# Patient Record
Sex: Female | Born: 1960 | Race: Black or African American | Hispanic: No | Marital: Married | Smoking: Current every day smoker
Health system: Southern US, Community
[De-identification: ages and names within clinical notes are randomized; demographics above are authoritative.]

## PROBLEM LIST (undated history)

## (undated) DIAGNOSIS — K219 Gastro-esophageal reflux disease without esophagitis: Secondary | ICD-10-CM

## (undated) DIAGNOSIS — I10 Essential (primary) hypertension: Secondary | ICD-10-CM

## (undated) DIAGNOSIS — J42 Unspecified chronic bronchitis: Secondary | ICD-10-CM

## (undated) DIAGNOSIS — J189 Pneumonia, unspecified organism: Secondary | ICD-10-CM

## (undated) DIAGNOSIS — R7303 Prediabetes: Secondary | ICD-10-CM

## (undated) DIAGNOSIS — J3801 Paralysis of vocal cords and larynx, unilateral: Secondary | ICD-10-CM

## (undated) DIAGNOSIS — D649 Anemia, unspecified: Secondary | ICD-10-CM

## (undated) DIAGNOSIS — M47812 Spondylosis without myelopathy or radiculopathy, cervical region: Secondary | ICD-10-CM

## (undated) DIAGNOSIS — E041 Nontoxic single thyroid nodule: Secondary | ICD-10-CM

## (undated) DIAGNOSIS — J302 Other seasonal allergic rhinitis: Secondary | ICD-10-CM

## (undated) DIAGNOSIS — R010 Benign and innocent cardiac murmurs: Secondary | ICD-10-CM

## (undated) DIAGNOSIS — M1990 Unspecified osteoarthritis, unspecified site: Secondary | ICD-10-CM

## (undated) HISTORY — PX: TUBAL LIGATION: SHX77

## (undated) HISTORY — DX: Essential (primary) hypertension: I10

## (undated) HISTORY — DX: Unspecified chronic bronchitis: J42

## (undated) HISTORY — DX: Paralysis of vocal cords and larynx, unilateral: J38.01

## (undated) HISTORY — DX: Prediabetes: R73.03

## (undated) HISTORY — PX: BIOPSY THYROID: PRO38

## (undated) HISTORY — DX: Nontoxic single thyroid nodule: E04.1

## (undated) HISTORY — DX: Benign and innocent cardiac murmurs: R01.0

## (undated) HISTORY — DX: Spondylosis without myelopathy or radiculopathy, cervical region: M47.812

---

## 1983-11-24 HISTORY — PX: LACERATION REPAIR: SHX5168

## 2016-06-17 DIAGNOSIS — F5101 Primary insomnia: Secondary | ICD-10-CM | POA: Diagnosis not present

## 2016-06-17 DIAGNOSIS — Z9109 Other allergy status, other than to drugs and biological substances: Secondary | ICD-10-CM | POA: Diagnosis not present

## 2016-06-17 DIAGNOSIS — J0100 Acute maxillary sinusitis, unspecified: Secondary | ICD-10-CM | POA: Diagnosis not present

## 2016-06-17 DIAGNOSIS — I10 Essential (primary) hypertension: Secondary | ICD-10-CM | POA: Diagnosis not present

## 2016-08-04 DIAGNOSIS — I10 Essential (primary) hypertension: Secondary | ICD-10-CM | POA: Diagnosis not present

## 2016-08-04 DIAGNOSIS — R011 Cardiac murmur, unspecified: Secondary | ICD-10-CM | POA: Diagnosis not present

## 2016-08-17 DIAGNOSIS — I371 Nonrheumatic pulmonary valve insufficiency: Secondary | ICD-10-CM | POA: Diagnosis not present

## 2016-08-17 DIAGNOSIS — R011 Cardiac murmur, unspecified: Secondary | ICD-10-CM | POA: Diagnosis not present

## 2016-10-23 DIAGNOSIS — J189 Pneumonia, unspecified organism: Secondary | ICD-10-CM

## 2016-10-23 HISTORY — DX: Pneumonia, unspecified organism: J18.9

## 2016-11-02 DIAGNOSIS — E049 Nontoxic goiter, unspecified: Secondary | ICD-10-CM | POA: Diagnosis not present

## 2016-11-02 DIAGNOSIS — R05 Cough: Secondary | ICD-10-CM | POA: Diagnosis not present

## 2016-11-02 DIAGNOSIS — J181 Lobar pneumonia, unspecified organism: Secondary | ICD-10-CM | POA: Diagnosis not present

## 2016-11-02 DIAGNOSIS — R918 Other nonspecific abnormal finding of lung field: Secondary | ICD-10-CM | POA: Diagnosis not present

## 2016-11-04 ENCOUNTER — Encounter: Payer: Self-pay | Admitting: Physician Assistant

## 2016-11-04 ENCOUNTER — Ambulatory Visit (INDEPENDENT_AMBULATORY_CARE_PROVIDER_SITE_OTHER): Payer: Federal, State, Local not specified - PPO | Admitting: Physician Assistant

## 2016-11-04 VITALS — BP 157/85 | HR 93 | Wt 187.0 lb

## 2016-11-04 DIAGNOSIS — L0592 Pilonidal sinus without abscess: Secondary | ICD-10-CM | POA: Insufficient documentation

## 2016-11-04 DIAGNOSIS — E049 Nontoxic goiter, unspecified: Secondary | ICD-10-CM

## 2016-11-04 DIAGNOSIS — N951 Menopausal and female climacteric states: Secondary | ICD-10-CM

## 2016-11-04 DIAGNOSIS — E041 Nontoxic single thyroid nodule: Secondary | ICD-10-CM

## 2016-11-04 DIAGNOSIS — I10 Essential (primary) hypertension: Secondary | ICD-10-CM

## 2016-11-04 DIAGNOSIS — Z79899 Other long term (current) drug therapy: Secondary | ICD-10-CM | POA: Insufficient documentation

## 2016-11-04 DIAGNOSIS — L0591 Pilonidal cyst without abscess: Secondary | ICD-10-CM

## 2016-11-04 DIAGNOSIS — Z299 Encounter for prophylactic measures, unspecified: Secondary | ICD-10-CM

## 2016-11-04 DIAGNOSIS — E876 Hypokalemia: Secondary | ICD-10-CM

## 2016-11-04 DIAGNOSIS — T502X5A Adverse effect of carbonic-anhydrase inhibitors, benzothiadiazides and other diuretics, initial encounter: Secondary | ICD-10-CM

## 2016-11-04 DIAGNOSIS — E559 Vitamin D deficiency, unspecified: Secondary | ICD-10-CM

## 2016-11-04 HISTORY — DX: Menopausal and female climacteric states: N95.1

## 2016-11-04 HISTORY — DX: Pilonidal sinus without abscess: L05.92

## 2016-11-04 HISTORY — DX: Nontoxic single thyroid nodule: E04.1

## 2016-11-04 MED ORDER — HYDROCHLOROTHIAZIDE 12.5 MG PO TABS: 25.0000 mg | ORAL_TABLET | Freq: Every day | ORAL | 3 refills | Status: DC

## 2016-11-04 NOTE — Patient Instructions (Signed)
Pilonidal Cyst Introduction A pilonidal cyst is a fluid-filled sac. It forms beneath the skin near your tailbone, at the top of the crease of your buttocks. A pilonidal cyst that is not large or infected may not cause symptoms or problems. If the cyst becomes irritated or infected, it may fill with pus. This causes pain and swelling (pilonidal abscess). An infected cyst may need to be treated with medicine, drained, or removed. What are the causes? The cause of a pilonidal cyst is not known. One cause may be a hair that grows into your skin (ingrown hair). What increases the risk? Pilonidal cysts are more common in boys and men. Risk factors include:  Having lots of hair near the crease of the buttocks.  Being overweight.  Having a pilonidal dimple.  Wearing tight clothing.  Not bathing or showering frequently.  Sitting for long periods of time. What are the signs or symptoms? Signs and symptoms of a pilonidal cyst may include:  Redness.  Pain and tenderness.  Warmth.  Swelling.  Pus.  Fever. How is this diagnosed? Your health care provider may diagnose a pilonidal cyst based on your symptoms and a physical exam. The health care provider may do a blood test to check for infection. If your cyst is draining pus, your health care provider may take a sample of the drainage to be tested at a laboratory. How is this treated? Surgery is the usual treatment for an infected pilonidal cyst. You may also have to take medicines before surgery. The type of surgery you have depends on the size and severity of the infected cyst. The different kinds of surgery include:  Incision and drainage. This is a procedure to open and drain the cyst.  Marsupialization. In this procedure, a large cyst or abscess may be opened and kept open by stitching the edges of the skin to the cyst walls.  Cyst removal. This procedure involves opening the skin and removing all or part of the cyst. Follow these  instructions at home:  Follow all of your surgeon's instructions carefully if you had surgery.  Take medicines only as directed by your health care provider.  If you were prescribed an antibiotic medicine, finish it all even if you start to feel better.  Keep the area around your pilonidal cyst clean and dry.  Clean the area as directed by your health care provider. Pat the area dry with a clean towel. Do not rub it as this may cause bleeding.  Remove hair from the area around the cyst as directed by your health care provider.  Do not wear tight clothing or sit in one place for long periods of time.  There are many different ways to close and cover an incision, including stitches, skin glue, and adhesive strips. Follow your health care provider's instructions on:  Incision care.  Bandage (dressing) changes and removal.  Incision closure removal. Contact a health care provider if:  You have drainage, redness, swelling, or pain at the site of the cyst.  You have a fever. This information is not intended to replace advice given to you by your health care provider. Make sure you discuss any questions you have with your health care provider. Document Released: 11/06/2000 Document Revised: 04/16/2016 Document Reviewed: 03/29/2014  2017 Elsevier

## 2016-11-04 NOTE — Progress Notes (Signed)
Hailey Robinson is a 55 y.o. female who presents to [facility] today to establish care.  Enlarged Thyroid: Patient was seen at [facility] urgent care on 12/11 and diagnosed with RLL CAP. Currently on Azithromycin and improving. It was noted on her CXR that she has an enlarged left thyroid lobe that is partially displacing her trachea and followup US was recommended. She has not noticed her thyroid changing in size and it is not causing her any discomfort. States that her sister had to have her thyroid removed.   HTN: she has a hx of HTN and was on Lisinopril 4-5 years ago. States that she lost weight and was able to discontinue medication per her PCP. In reviewing her outside records from [facility], she had an elevated BP of 146/70. Checks BP's at work and they have been [id number] systolic. She reports occasional headaches. Denies chest pain, dyspnea, lightheadedness, and edema. Of note, she had an ECHO on 08/17/16 that showed mild LV hypertrophy. LV EF 60-65%.  Perimenopause: patient states that periods have become irregular and shorter. LMP was 3 months ago and lasted 2 days. Endorses vasomotor symptoms, mood swings, and decreased libido. Denies dyspareunia or abnormal vaginal discharge.  Additionally, she complained of an area on her buttocks where she says the skin is "thick." She states she has a history of "boils."  Past Medical History:  Diagnosis Date  . Allergy   . Functional systolic murmur    normal ECHO 07/2016  . Hypertension    Past Surgical History:  Procedure Laterality Date  . FINGER SURGERY    . TUBAL LIGATION     Social History  Substance Use Topics  . Smoking status: Current Every Day Smoker    Packs/day: 0.50    Years: 10.00  . Smokeless tobacco: Never Used  . Alcohol use 1.2 oz/week    2 Cans of beer per week   family history includes Hyperlipidemia  in her father and mother; Hypertension in her father and mother; Thyroid disease in her sister.  ROS: negative except as noted in the HPI  Medications: Current Outpatient Prescriptions  Medication Sig Dispense Refill  . azithromycin (ZITHROMAX) 250 MG tablet Take 2 tablets (500 mg) on  Day 1,  followed by 1 tablet (250 mg) once daily on Days 2 through 5.    . benzonatate (TESSALON) 200 MG capsule Take by mouth.    . promethazine-dextromethorphan (PROMETHAZINE-DM) 6.25-15 MG/5ML syrup Take by mouth.    . hydrochlorothiazide (HYDRODIURIL) 12.5 MG tablet Take 2 tablets (25 mg total) by mouth daily. 30 tablet 3   No current facility-administered medications for this visit.    No Known Allergies  Health Maintenance Health Maintenance  Topic Date Due  . Hepatitis C Screening  11-03-1961  . HIV Screening  02/28/1977  . TETANUS/TDAP  02/28/1981  . PAP SMEAR  03/01/1983  . MAMMOGRAM  02/29/2012  . COLONOSCOPY  02/29/2012  . INFLUENZA VACCINE  Completed     Objective:  BP (!) 157/85   Pulse 93   Wt 187 lb (84.8 kg)  Physical Exam  HENT:  Right Ear: Tympanic membrane normal.  Left Ear: Tympanic membrane normal.  Nose: Nose normal.  Mouth/Throat: Oropharynx is clear and moist and mucous membranes are normal. No oral lesions. Abnormal dentition (missing multiple molars). Dental caries (numerous) present.  Eyes: Conjunctivae, EOM and lids are normal. Pupils are equal, round, and reactive to light.  Neck: Phonation normal. No tracheal tenderness  present. Carotid bruit is not present. Thyromegaly (left greater than right, nontender, no palpable nodule) present.  Cardiovascular: Normal rate, regular rhythm, S1 normal, S2 normal and intact distal pulses.   Murmur (2/6 systolic at LUSB) heard. Pulmonary/Chest: Effort normal. No stridor. She has decreased breath sounds in the right middle field and the right lower field. She has no wheezes. She has no rhonchi. She has no rales.    Lymphadenopathy:    She has no cervical adenopathy.       Right: No supraclavicular adenopathy present.       Left: No supraclavicular adenopathy present.  Neurological: She is alert. No cranial nerve deficit.  Skin: Skin is warm and dry. No bruising and no rash noted.     Psychiatric: She has a normal mood and affect. Her speech is normal and behavior is normal. Thought content normal.   I personally reviewed outside records, including most recent echocardiogram  Assessment and Plan: 55 y.o. female with   1. Thyroid enlargement - TSH - US THYROID - T3, free - T4, free - Thyroglobulin Antibody Panel - Thyroid stimulating immunoglobulin  2. Preventive measure - Comprehensive metabolic panel - Hemoglobin A1c - Lipid panel - VITAMIN D 25 Hydroxy (Vit-D Deficiency, Fractures) - CBC - Hepatitis C antibody - HIV antibody  3. Essential hypertension -Given her multiple risk factors (african american, obesity, family hx) and hx of HTN requiring Lisinopril, multiple elevated home readings, and signs of early LV hypertrophy I think it is reasonable to start medication today -Patient instructed to continue to self-monitor and record readings - Lipid panel - hydrochlorothiazide (HYDRODIURIL) 12.5 MG tablet; Take 2 tablets (25 mg total) by mouth daily.  Dispense: 30 tablet; Refill: 3  4. Perimenopause   5. Pilonidal sinus - Ambulatory referral to General Surgery  Patient education and anticipatory guidance given Patient agrees with treatment plan Follow-up in 1 month or sooner as needed.  Darlyne Russian PA-C

## 2016-11-07 LAB — LIPID PANEL
CHOLESTEROL: 150 mg/dL (ref <200)
HDL: 34 mg/dL — AB (ref >50)
LDL CALC: 97 mg/dL (ref <100)
TRIGLYCERIDES: 94 mg/dL (ref <150)
Total CHOL/HDL Ratio: 4.4 Ratio (ref <5.0)
VLDL: 19 mg/dL (ref <30)

## 2016-11-07 LAB — CBC
HEMATOCRIT: 33.5 % — AB (ref 35.0–45.0)
HEMOGLOBIN: 9.4 g/dL — AB (ref 11.7–15.5)
MCH: 18.5 pg — ABNORMAL LOW (ref 27.0–33.0)
MCHC: 28.1 g/dL — ABNORMAL LOW (ref 32.0–36.0)
MCV: 66.1 fL — AB (ref 80.0–100.0)
MPV: 9.2 fL (ref 7.5–12.5)
Platelets: 383 10*3/uL (ref 140–400)
RBC: 5.07 MIL/uL (ref 3.80–5.10)
RDW: 17.6 % — ABNORMAL HIGH (ref 11.0–15.0)
WBC: 5.3 10*3/uL (ref 3.8–10.8)

## 2016-11-07 LAB — COMPREHENSIVE METABOLIC PANEL
ALT: 8 U/L (ref 6–29)
AST: 16 U/L (ref 10–35)
Albumin: 4.1 g/dL (ref 3.6–5.1)
Alkaline Phosphatase: 77 U/L (ref 33–130)
BUN: 6 mg/dL — AB (ref 7–25)
CALCIUM: 9.7 mg/dL (ref 8.6–10.4)
CHLORIDE: 97 mmol/L — AB (ref 98–110)
CO2: 28 mmol/L (ref 20–31)
Creat: 0.66 mg/dL (ref 0.50–1.05)
GLUCOSE: 89 mg/dL (ref 65–99)
POTASSIUM: 3.3 mmol/L — AB (ref 3.5–5.3)
Sodium: 139 mmol/L (ref 135–146)
Total Bilirubin: 0.4 mg/dL (ref 0.2–1.2)
Total Protein: 8.2 g/dL — ABNORMAL HIGH (ref 6.1–8.1)

## 2016-11-07 LAB — HIV ANTIBODY (ROUTINE TESTING W REFLEX): HIV 1&2 Ab, 4th Generation: NONREACTIVE

## 2016-11-07 LAB — HEMOGLOBIN A1C
HEMOGLOBIN A1C: 5.4 % (ref <5.7)
Mean Plasma Glucose: 108 mg/dL

## 2016-11-07 LAB — T4, FREE: FREE T4: 1.2 ng/dL (ref 0.8–1.8)

## 2016-11-07 LAB — VITAMIN D 25 HYDROXY (VIT D DEFICIENCY, FRACTURES): Vit D, 25-Hydroxy: 20 ng/mL — ABNORMAL LOW (ref 30–100)

## 2016-11-07 LAB — THYROGLOBULIN ANTIBODY PANEL: THYROID PEROXIDASE ANTIBODY: 1 [IU]/mL (ref <9)

## 2016-11-07 LAB — TSH: TSH: 0.22 mIU/L — ABNORMAL LOW

## 2016-11-07 LAB — T3, FREE: T3 FREE: 3.9 pg/mL (ref 2.3–4.2)

## 2016-11-07 LAB — HEPATITIS C ANTIBODY: HCV Ab: NEGATIVE

## 2016-11-09 LAB — THYROID STIMULATING IMMUNOGLOBULIN: TSI: 338 %{baseline} — AB (ref <140)

## 2016-11-09 NOTE — Progress Notes (Signed)
Patient's labs show suppressed TSH (0.22) and extremely elevated Thyroglobulin (>450,000). Ordered Nuc Med Thyroid Uptake imaging and placed referral to Endocrinology.   Labs also showed a microcytic anemia (hgb 9.4, MCV 66.1) with elliptocytes. Will order anemia panel to determine if this is IDA or a thalassemia.   She was mildly hypokalemic (3.2) and is currently on Hydrodiuril 12.5mg  daily. Sent a prescription for potassium chloride tabs.   Additionally, sent prescription for Vit D 50,000U for vitamin D insufficiency (20).  I called the patient at 8:20am and left a voicemail instructing her to call back and schedule a follow-up appointment for this week.

## 2016-11-09 NOTE — Addendum Note (Signed)
Addended by: Nelson Chimes E on: 11/09/2016 10:47 AM   Modules accepted: Orders

## 2016-11-10 MED ORDER — POTASSIUM CHLORIDE CRYS ER 20 MEQ PO TBCR: 20.0000 meq | EXTENDED_RELEASE_TABLET | Freq: Every day | ORAL | 3 refills | Status: DC

## 2016-11-10 MED ORDER — VITAMIN D (ERGOCALCIFEROL) 1.25 MG (50000 UNIT) PO CAPS: 50000.0000 [IU] | ORAL_CAPSULE | ORAL | 0 refills | Status: DC

## 2016-11-10 NOTE — Addendum Note (Signed)
Addended by: Nelson Chimes E on: 11/10/2016 05:34 PM   Modules accepted: Orders

## 2016-11-13 ENCOUNTER — Ambulatory Visit (INDEPENDENT_AMBULATORY_CARE_PROVIDER_SITE_OTHER): Payer: Federal, State, Local not specified - PPO | Admitting: Physician Assistant

## 2016-11-13 VITALS — BP 145/81 | HR 89 | Wt 186.0 lb

## 2016-11-13 DIAGNOSIS — E0590 Thyrotoxicosis, unspecified without thyrotoxic crisis or storm: Secondary | ICD-10-CM | POA: Diagnosis not present

## 2016-11-13 NOTE — Progress Notes (Signed)
HPI:                                                                Hailey Robinson is a 55 y.o. female who presents to [facility] [place] today to review lab results  Patient had an incidental finding on CXR of an enlarged left thyroid lobe on 12/11 that was partially displacing her trachea. Patient is asymptomatic.   Patient's labs show suppressed TSH (0.22) and extremely elevated Thyroglobulin (>450,000) consistent with early Grave's disease.  Lab Results  Component Value Date   TSH 0.22 (L) 11/04/2016    She has an [place] scheduled for 11/19/16 and will be seeing Endocrinology on 12/18/16.   Past Medical History:  Diagnosis Date  . Allergy   . Functional systolic murmur    normal ECHO 07/2016  . Hypertension    Past Surgical History:  Procedure Laterality Date  . FINGER SURGERY    . TUBAL LIGATION     Social History  Substance Use Topics  . Smoking status: Current Every Day Smoker    Packs/day: 0.50    Years: 10.00  . Smokeless tobacco: Never Used  . Alcohol use 1.2 oz/week    2 Cans of beer per week   family history includes Hyperlipidemia in her father and mother; Hypertension in her father and mother; Thyroid disease in her sister.  ROS: negative except as noted in the HPI  Medications: Current Outpatient Prescriptions  Medication Sig Dispense Refill  . hydrochlorothiazide (HYDRODIURIL) 12.5 MG tablet Take 2 tablets (25 mg total) by mouth daily. 30 tablet 3  . potassium chloride SA (K-DUR,KLOR-CON) 20 MEQ tablet Take 1 tablet (20 mEq total) by mouth daily. 30 tablet 3  . Vitamin D, Ergocalciferol, (DRISDOL) 50000 units CAPS capsule Take 1 capsule (50,000 Units total) by mouth every 7 (seven) days. Take for 8 total doses(weeks) 8 capsule 0   No current facility-administered medications for this visit.    No Known Allergies   Objective:  BP (!) 145/81   Pulse 89   Wt 186 lb (84.4 kg)  Gen: Alert, not  ill-appearing, no distress HEENT: Extraocular movements intact, normal eyelids and sclera, no goiter Lungs: Normal work of breathing, speaking in full sentences. Extremities: no peripheral edema Skin: no rashes or lesions on exposed skin  Assessment and Plan: 55 y.o. female with new diagnosis of hyperthyroidism consistent with Grave's disease.  She is scheduled to be seen by Dr. Renne Crigler in Endocrinology next month.  Additionally, she has a Vitamin D deficiency and will start 50,000 supplementation weekly    Patient education and anticipatory guidance given Patient agrees with treatment plan Follow-up in 3 months or sooner as needed.  Darlyne Russian PA-C

## 2016-11-19 ENCOUNTER — Other Ambulatory Visit: Payer: Self-pay | Admitting: Sports Medicine

## 2016-11-19 ENCOUNTER — Ambulatory Visit (INDEPENDENT_AMBULATORY_CARE_PROVIDER_SITE_OTHER): Payer: Federal, State, Local not specified - PPO

## 2016-11-19 DIAGNOSIS — E049 Nontoxic goiter, unspecified: Secondary | ICD-10-CM

## 2016-11-19 DIAGNOSIS — I10 Essential (primary) hypertension: Secondary | ICD-10-CM

## 2016-11-19 DIAGNOSIS — E079 Disorder of thyroid, unspecified: Secondary | ICD-10-CM | POA: Diagnosis not present

## 2016-11-19 NOTE — Telephone Encounter (Signed)
To PCP

## 2016-11-19 NOTE — Addendum Note (Signed)
Addended by: Nelson Chimes E on: 11/19/2016 02:38 PM   Modules accepted: Orders

## 2016-11-19 NOTE — Progress Notes (Signed)
Ordered thyroid biopsy as recommended by the ultrasound report below. Informed patient today.  Thyroid U/S 11/19/16 IMPRESSION: Asymmetric enlargement of the left lobe of the thyroid secondary to an apparent 6 cm nodule/pseudo nodule within the posterior inferior aspect of the left lobe of the thyroid. This nodule/pseudo nodule meets imaging criteria to recommend percutaneous sampling as clinically indicated.  Darlyne Russian PA-C

## 2016-12-01 ENCOUNTER — Telehealth: Payer: Self-pay | Admitting: Physician Assistant

## 2016-12-01 NOTE — Telephone Encounter (Signed)
Pt notified that form is ready for pick up

## 2016-12-01 NOTE — Telephone Encounter (Signed)
Patient called checking on her disability or fmla paperwork asking if it was completed and ready for pick up and I adv would send a phone message. Pt req a nurse to call her regarding info. Thanks

## 2016-12-02 ENCOUNTER — Ambulatory Visit: Payer: Federal, State, Local not specified - PPO | Admitting: Physician Assistant

## 2016-12-08 ENCOUNTER — Other Ambulatory Visit (HOSPITAL_COMMUNITY)
Admission: RE | Admit: 2016-12-08 | Discharge: 2016-12-08 | Disposition: A | Discharge status: Home / Self Care | Payer: Federal, State, Local not specified - PPO | Source: Ambulatory Visit | Attending: Radiology | Admitting: Radiology

## 2016-12-08 ENCOUNTER — Ambulatory Visit
Admission: RE | Admit: 2016-12-08 | Discharge: 2016-12-08 | Disposition: A | Discharge status: Home / Self Care | Payer: Federal, State, Local not specified - PPO | Source: Ambulatory Visit | Attending: Physician Assistant | Admitting: Physician Assistant

## 2016-12-08 DIAGNOSIS — E041 Nontoxic single thyroid nodule: Secondary | ICD-10-CM | POA: Insufficient documentation

## 2016-12-11 ENCOUNTER — Other Ambulatory Visit: Payer: Self-pay | Admitting: Physician Assistant

## 2016-12-11 DIAGNOSIS — E049 Nontoxic goiter, unspecified: Secondary | ICD-10-CM

## 2016-12-16 ENCOUNTER — Telehealth: Payer: Self-pay | Admitting: Internal Medicine

## 2016-12-16 NOTE — Telephone Encounter (Signed)
New patient stated that she had a biospy last tue do she still need t come see you.

## 2016-12-16 NOTE — Telephone Encounter (Signed)
Called patient and she states that she did have the biopsy completed, but patient says that they had to redo the biopsy for tomorrow because they did not get adequate results for the first one. Patient is questioning if she needs to still come on Friday or if she needs to move the new appointment when we will have results. Please advise. Thank you!

## 2016-12-16 NOTE — Telephone Encounter (Signed)
She needs to come to the appointment. I can get the results afterwards.

## 2016-12-16 NOTE — Telephone Encounter (Signed)
Called and notified patient to keep appointment for Friday.

## 2016-12-17 ENCOUNTER — Other Ambulatory Visit (HOSPITAL_COMMUNITY)
Admission: RE | Admit: 2016-12-17 | Discharge: 2016-12-17 | Disposition: A | Discharge status: Home / Self Care | Payer: Federal, State, Local not specified - PPO | Source: Ambulatory Visit | Attending: Radiology | Admitting: Radiology

## 2016-12-17 ENCOUNTER — Ambulatory Visit
Admission: RE | Admit: 2016-12-17 | Discharge: 2016-12-17 | Disposition: A | Discharge status: Home / Self Care | Payer: Federal, State, Local not specified - PPO | Source: Ambulatory Visit | Attending: Physician Assistant | Admitting: Physician Assistant

## 2016-12-17 DIAGNOSIS — E041 Nontoxic single thyroid nodule: Secondary | ICD-10-CM | POA: Diagnosis not present

## 2016-12-17 DIAGNOSIS — E049 Nontoxic goiter, unspecified: Secondary | ICD-10-CM

## 2016-12-18 ENCOUNTER — Other Ambulatory Visit: Payer: Self-pay | Admitting: Radiology

## 2016-12-18 ENCOUNTER — Encounter: Payer: Self-pay | Admitting: Internal Medicine

## 2016-12-18 ENCOUNTER — Ambulatory Visit (INDEPENDENT_AMBULATORY_CARE_PROVIDER_SITE_OTHER): Payer: Federal, State, Local not specified - PPO | Admitting: Internal Medicine

## 2016-12-18 VITALS — BP 126/84 | HR 105 | Ht 65.5 in | Wt 191.0 lb

## 2016-12-18 DIAGNOSIS — R7989 Other specified abnormal findings of blood chemistry: Secondary | ICD-10-CM

## 2016-12-18 DIAGNOSIS — E041 Nontoxic single thyroid nodule: Secondary | ICD-10-CM | POA: Diagnosis not present

## 2016-12-18 DIAGNOSIS — E0590 Thyrotoxicosis, unspecified without thyrotoxic crisis or storm: Secondary | ICD-10-CM | POA: Diagnosis not present

## 2016-12-18 DIAGNOSIS — R946 Abnormal results of thyroid function studies: Secondary | ICD-10-CM | POA: Diagnosis not present

## 2016-12-18 LAB — TSH: TSH: 0.82 u[IU]/mL (ref 0.35–4.50)

## 2016-12-18 LAB — T4, FREE: Free T4: 0.79 ng/dL (ref 0.60–1.60)

## 2016-12-18 LAB — T3, FREE: T3 FREE: 3.2 pg/mL (ref 2.3–4.2)

## 2016-12-18 NOTE — Progress Notes (Signed)
Patient ID: Hailey Robinson, female   DOB: [birth year], 56 y.o.   MRN: [id number]    HPI  Hailey Robinson is a 56 y.o.-year-old female, referred by her PCP, Trixie Dredge, PA-C, for evaluation for goiter, low TSH and a thyroid nodule.  She describes that she had the flu  In 10/2016 >> went to UC >> CXR (11/02/2016) showed PNA + incidental thyroid nodule: IMPRESSION: 1. Patchy right perihilar infiltrate. 2. No significant change rightward displacement of trachea due to complex enlarged left thyroid lobe on 07/05/2015 cervical spine CT. Thyroid ultrasound would be helpful in further evaluation.  Thyroid U/S (11/19/2016): Left lobe enlarged, 10.1 x 4 x 3.7 cm, containing a isoechoic solid 3.2 x 2.2 x 6.0 cm nodule  The nodule was attempted to be biopsied on 12/08/2016 but the sample was insufficient.  She had a repeat attempted biopsy on 12/17/2016. Results are pending.  Pt denies: - feeling nodules in neck - hoarseness - dysphagia - choking - SOB with lying down  I reviewed pt's thyroid tests: Lab Results  Component Value Date   TSH 0.22 (L) 11/04/2016   FREET4 1.2 11/04/2016    Component     Latest Ref Rng & Units 11/04/2016  Thyroperoxidase Ab SerPl-aCnc     <9 IU/mL 1  Thyroglobulin Ab     <2 IU/mL <1  Thyroglobulin     ng/mL >450000.0 (H)  TSI     <140 % baseline 338 (H)   Pt c/o: - + fatigue - + insomnia - + heat intolerance/cold intolerance - no tremors - no palpitations - + a little anxiety/no depression - no hyperdefecation/constipation - + weight gain - no dry skin - + hair loss  + FH of thyroid ds. In sister - hyperthyroidism - had thyroidectomy. No FH of thyroid cancer. No h/o radiation tx to head or neck.  No seaweed or kelp. No recent contrast studies. No steroid use. No herbal supplements. No Biotin supplements or Hair, Skin and Nails vitamins.  Pt also has a history of HTN.  ROS: Constitutional: + see HPI Eyes: no blurry vision,  no xerophthalmia ENT: no sore throat, + see HPI Cardiovascular: no CP/SOB/palpitations/leg swelling Respiratory: + cough/no SOB Gastrointestinal: no N/V/D/C/+ heartburn Musculoskeletal: + both: muscle/joint aches Skin: no rashes, + hair loss, + rash Neurological: no tremors/numbness/tingling/dizziness Psychiatric: no depression/anxiety + low libido  Past Medical History:  Diagnosis Date  . Allergy   . Functional systolic murmur    normal ECHO 07/2016  . Hypertension    Past Surgical History:  Procedure Laterality Date  . FINGER SURGERY    . TUBAL LIGATION     Social History   Social History  . Marital status: Married    Spouse name: N/A  . Number of children: 3   Occupational History  .  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 Smoking status: Current Every Day Smoker    Packs/day: 0.50    Years: 10.00  . Smokeless tobacco: Never Used  . Alcohol use 1.2 oz/week    2 Cans of beer per week  . Drug use: No   Current Outpatient Prescriptions on File Prior to Visit  Medication Sig Dispense Refill  . hydrochlorothiazide (HYDRODIURIL) 25 MG tablet Take 1 tablet (25 mg total) by mouth daily. 90 tablet 1  . potassium chloride SA (K-DUR,KLOR-CON) 20 MEQ tablet Take 1 tablet (20 mEq total) by mouth daily. 30 tablet 3  . Vitamin  D, Ergocalciferol, (DRISDOL) 50000 units CAPS capsule Take 1 capsule (50,000 Units total) by mouth every 7 (seven) days. Take for 8 total doses(weeks) 8 capsule 0   No current facility-administered medications on file prior to visit.    No Known Allergies Family History  Problem Relation Age of Onset  . Hyperlipidemia Mother   . Hypertension Mother   . Hypertension Father   . Hyperlipidemia Father   . Thyroid disease Sister    PE: BP 126/84   Pulse (!) 105   Ht 5' 5.5" (1.664 m)   Wt 191 lb (86.6 kg)   LMP 12/18/2016   SpO2 98%   BMI 31.30 kg/m  Wt Readings from Last 3 Encounters:  12/18/16 191 lb (86.6  kg)  11/13/16 186 lb (84.4 kg)  11/04/16 187 lb (84.8 kg)   Constitutional: overweight, in NAD Eyes: PERRLA, EOMI, no exophthalmos ENT: moist mucous membranes, no thyromegaly, + left lobe of the thyroid palpable, slightly fuller, no cervical lymphadenopathy Cardiovascular: tachycardia, RR, No MRG Respiratory: CTA B Gastrointestinal: abdomen soft, NT, ND, BS+ Musculoskeletal: no deformities, strength intact in all 4;  Skin: moist, warm, no rashes Neurological: no tremor with outstretched hands, DTR normal in all 4  ASSESSMENT: 1. L large Thyroid nodule  2. Low TSH  PLAN: 1. L large thyroid nodule - I reviewed the images of her recent thyroid ultrasound along with the patient. I pointed out that the L nodule is large, this being a risk factor for cancer.  Otherwise, the nodule is - not hypoechoic - without microcalcifications - has a spongiform appearance - without internal blood flow - well delimited from surrounding tissue Pt does not have a thyroid cancer family history or a personal history of RxTx to head/neck. All these would favor benignity.  - She had 2 biopsies so far, first inconclusive because of scant specimen, and the second yesterday, with the results pending. We discussed about possible scenarios depending on the results of the biopsy. 1. If the biopsy is benign, there is no indication for surgery, but will continue to monitor the nodule and repeat the biopsy if it changes ultrasound characteristics. Also, she may need left lobectomy if she starts having neck compression symptoms. 2. If the biopsy is malignant, then she will need to have thyroidectomy >> will refer to surgery at that point. She agrees with this. 3. If the results are again inconclusive, we may need to repeat the biopsy in 6 months to a year. -  if we decide for a conservative approach, she should let me know if she develops neck compression symptoms before next visit - I'll see her back in 6 mo,  assuming her FNA is normal. If FNA abnormal, we will meet sooner.  - I advised pt to join my chart and I will send her the results through there   2. Low TSH - Patient had an instant of a slightly low TSH, at 0.22, with normal free T4. TSI antibodies were slightly elevated, while the TPO and ATA antibodies were not. We discussed that this may mean either thyroiditis after her upper respiratory infection or mild Graves' disease. For thyroiditis the treatment is conservative, while for Graves' disease, if the tests deteriorate, she may need methimazole, RAI treatment, or Sx. - today, will repeat a TSH, free T4, free T3, and TSI   Component     Latest Ref Rng & Units 12/18/2016  TSH     0.35 - 4.50 uIU/mL 0.82  Triiodothyronine,Free,Serum  2.3 - 4.2 pg/mL 3.2  T4,Free(Direct)     0.60 - 1.60 ng/dL 0.79  TSI     <140 % baseline 206 (H)   TFTs normal with TSI decreasing.No treatment needed for now. Possibly mild Graves' disease which is improving or improving thyroiditis.  Thyroid biopsy shows a benign follicular thyroid nodule. No intervention needed for now.  Philemon Kingdom, MD PhD [facility]

## 2016-12-18 NOTE — Patient Instructions (Signed)
Please stop at the lab.  Please come back for a follow-up appointment in 6 months.   Thyroid Nodule A thyroid nodule is an isolatedgrowth of thyroid cells that forms a lump in your thyroid gland. The thyroid gland is a butterfly-shaped gland. It is found in the lower front of your neck. This gland sends chemical messengers (hormones) through your blood to all parts of your body. These hormones are important in regulating your body temperature and helping your body to use energy. Thyroid nodules are common. Most are not cancerous (are benign). You may have one nodule or several nodules. Different types of thyroid nodules include:  Nodules that grow and fill with fluid (thyroid cysts).  Nodules that produce too much thyroid hormone (hot nodules or hyperthyroid).  Nodules that produce no thyroid hormone (cold nodules or hypothyroid).  Nodules that form from cancer cells (thyroid cancers). What are the causes? Usually, the cause of this condition is not known. What increases the risk? Factors that make this condition more likely to develop include:  Increasing age. Thyroid nodules become more common in people who are older than 55 years of age.  Gender.  Benign thyroid nodules are more common in women.  Cancerous (malignant) thyroid nodules are more common in men.  A family history that includes:  Thyroid nodules.  Pheochromocytoma.  Thyroid carcinoma.  Hyperparathyroidism.  Certain kinds of thyroid diseases, such as Hashimoto thyroiditis.  Lack of iodine.  A history of head and neck radiation, such as from X-rays. What are the signs or symptoms? It is common for this condition to cause no symptoms. If you have symptoms, they may include:  A lump in your lower neck.  Feeling a lump or tickle in your throat.  Pain in your neck, jaw, or ear.  Having trouble swallowing. Hot nodules may cause symptoms that include:  Weight loss.  Warm, flushed skin.  Feeling  hot.  Feeling nervous.  A racing heartbeat. Cold nodules may cause symptoms that include:  Weight gain.  Dry skin.  Brittle hair. This may also occur with hair loss.  Feeling cold.  Fatigue. Thyroid cancer nodules may cause symptoms that include:  Hard nodules that feel stuck to the thyroid gland.  Hoarseness.  Lumps in the glands near your thyroid (lymph nodes). How is this diagnosed? A thyroid nodule may be felt by your health care provider during a physical exam. This condition may also be diagnosed based on your symptoms. You may also have tests, including:  An ultrasound. This may be done to confirm the diagnosis.  A biopsy. This involves taking a sample from the nodule and looking at it under a microscope to see if the nodule is benign.  Blood tests to make sure that your thyroid is working properly.  Imaging tests such as MRI or CT scan may be done if:  Your nodule is large.  Your nodule is blocking your airway.  Cancer is suspected. How is this treated? Treatment depends on the cause and size of your nodule or nodules. If the nodule is benign, treatment may not be necessary. Your health care provider may monitor the nodule to see if it goes away without treatment. If the nodule continues to grow, is cancerous, or does not go away:  It may need to be drained with a needle.  It may need to be removed with surgery. If you have surgery, part or all of your thyroid gland may need to be removed as well. Follow these instructions  at home:  Pay attention to any changes in your nodule.  Take over-the-counter and prescription medicines only as told by your health care provider.  Keep all follow-up visits as told by your health care provider. This is important. Contact a health care provider if:  Your voice changes.  You have trouble swallowing.  You have pain in your neck, ear, or jaw that is getting worse.  Your nodule gets bigger.  Your nodule starts to  make it harder for you to breathe. Get help right away if:  You have a sudden fever.  You feel very weak.  Your muscles look like they are shrinking (muscle wasting).  You have mood swings.  You feel very restless.  You feel confused.  You are seeing or hearing things that other people do not see or hear (having hallucinations).  You feel suddenly nauseous or throw up.  You suddenly have diarrhea.  You have chest pain.  There is a loss of consciousness. This information is not intended to replace advice given to you by your health care provider. Make sure you discuss any questions you have with your health care provider. Document Released: 10/02/2004 Document Revised: 07/12/2016 Document Reviewed: 02/20/2015 Elsevier Interactive Patient Education  2017 [employer].

## 2016-12-19 ENCOUNTER — Encounter: Payer: Self-pay | Admitting: Physician Assistant

## 2016-12-22 LAB — THYROID STIMULATING IMMUNOGLOBULIN: TSI: 206 % baseline — ABNORMAL HIGH (ref <140)

## 2016-12-24 ENCOUNTER — Telehealth: Payer: Self-pay

## 2016-12-24 NOTE — Telephone Encounter (Signed)
-----   Message from Philemon Kingdom, MD sent at 12/23/2016  1:26 PM EST ----- Almyra Free, can you please call pt: TFTs normal with Graves antibodies are decreasing.No treatment needed for now.  Thyroid biopsy was benign. No intervention needed for now.

## 2016-12-24 NOTE — Telephone Encounter (Signed)
Called patient and gave lab results. Patient had no questions or concerns.  

## 2017-01-11 ENCOUNTER — Telehealth: Payer: Self-pay

## 2017-01-11 DIAGNOSIS — E041 Nontoxic single thyroid nodule: Secondary | ICD-10-CM

## 2017-01-11 DIAGNOSIS — R1310 Dysphagia, unspecified: Secondary | ICD-10-CM

## 2017-01-11 NOTE — Telephone Encounter (Signed)
Pt.notified

## 2017-01-11 NOTE — Telephone Encounter (Signed)
Pt reports that when swallowing she chokes. She wants to know what to do. Please advise.

## 2017-01-11 NOTE — Telephone Encounter (Signed)
Pt is having issues swallowing and is choking when she is eating, please advise.

## 2017-01-11 NOTE — Telephone Encounter (Signed)
Called and advised of patients last office visit note. Patient understood, and had no other questions at this time.

## 2017-01-11 NOTE — Telephone Encounter (Signed)
Is there any pain? She may have sore throat... She may try to take ibuprofen decreasing inflammation. However, she just call her PCP who referred her emergently to surgery for the nodule.

## 2017-01-11 NOTE — Telephone Encounter (Signed)
Patient called the office today reporting dysphagia and choking sensation when she swallows. Consistent with compressive symptoms from large, left thyroid nodule. Submitting urgent referral to general surgery.

## 2017-01-11 NOTE — Telephone Encounter (Signed)
Called patient and notified. No other questions at this time.

## 2017-01-11 NOTE — Telephone Encounter (Signed)
Tell her I am going to submit an urgent referral to general surgery so they can hopefully see her this week.

## 2017-01-13 ENCOUNTER — Ambulatory Visit: Payer: Self-pay | Admitting: General Surgery

## 2017-01-13 DIAGNOSIS — E041 Nontoxic single thyroid nodule: Secondary | ICD-10-CM | POA: Diagnosis not present

## 2017-01-13 NOTE — H&P (Signed)
History of Present Illness (Volanda Mangine MD; 01/13/2017 3:17 PM) Patient words: thyroid.  The patient is a 55 year old female who presents with a thyroid nodule. The patient is a 55-year-old female who is referred by Dr. Cummings for evaluation of left thyroid nodule and dysphagia. Patient states that she had some choking spells with meals. She states that when she lies down she does also have some choking sensation.  Patient has had a known left-sided solid thyroid nodule. This measured 3.2 x 2.2 x 6.0 cm. This underwent FNA biopsy. This resulted in a benign follicular nodule. Patient was instructed for routine follow-up.  EXAM: THYROID ULTRASOUND  TECHNIQUE: Ultrasound examination of the thyroid gland and adjacent soft tissues was performed.  COMPARISON: None.  FINDINGS: Parenchymal Echotexture: Mildly heterogenous  Estimated total number of nodules >/= 1 cm: 1  Number of spongiform nodules >/= 2 cm not described below (TR1): 0  Number of mixed cystic and solid nodules >/= 1.5 cm not described below (TR2): 0  _________________________________________________________  Isthmus: Normal in size measuring 0.4 cm in diameter  No discrete nodules are identified within the thyroid isthmus.  _________________________________________________________  Right lobe: Borderline enlarged measuring 5.1 x 2.0 x 1.8 cm  No discrete nodules are identified within the right lobe of the thyroid.  _________________________________________________________  Left lobe: Enlarged measuring 10.1 x 4.0 x 3.7 cm  Nodule # 1:  Location: Left; Inferior  Size: 3.2 x 2.2 x 6.0 cm  Composition: solid/almost completely solid (2)  Echogenicity: isoechoic (1)  Shape: not taller-than-wide (0)  Margins: ill-defined (0)  Echogenic foci: none (0)  ACR TI-RADS total points: 3.  ACR TI-RADS risk category: TR3 (3 points).  ACR TI-RADS recommendations:  **Given size (>/= 2.5 cm) and  appearance, fine needle aspiration of this mildly suspicious nodule should be considered based on TI-RADS criteria.  IMPRESSION: Asymmetric enlargement of the left lobe of the thyroid secondary to an apparent 6 cm nodule/pseudo nodule within the posterior inferior aspect of the left lobe of the thyroid. This nodule/pseudo nodule meets imaging criteria to recommend percutaneous sampling as clinically indicated.  PAthology: Diagnosis THYROID, LEFT, FINE NEEDLE ASPIRATION (SPECIMEN 1 OF 1, COLLECTED ON 12/17/2016): CONSISTENT WITH BENIGN FOLLICULAR NODULE (BETHESDA CATEGORY II).   Past Surgical History (Ammie Eversole, LPN; 01/13/2017 2:17 PM) No pertinent past surgical history   Diagnostic Studies History (Ammie Eversole, LPN; 01/13/2017 2:17 PM) Colonoscopy  never Mammogram  never Pap Smear  1-5 years ago  Allergies (Ammie Eversole, LPN; 01/13/2017 2:18 PM) No Known Drug Allergies 01/13/2017  Medication History (Ammie Eversole, LPN; 01/13/2017 2:20 PM) HydroCHLOROthiazide (25MG Tablet, Oral) Active. K-Dur (20MEQ Tablet ER, Oral) Active. Vitamin D (Cholecalciferol) (1000UNIT Capsule, Oral) Active. Medications Reconciled  Social History (Ammie Eversole, LPN; 01/13/2017 2:17 PM) Alcohol use  Occasional alcohol use. Caffeine use  Coffee, Tea. No drug use  Tobacco use  Current every day smoker.  Family History (Ammie Eversole, LPN; 01/13/2017 2:17 PM) Arthritis  Mother. Hypertension  Father, Mother. Kidney Disease  Mother. Thyroid problems  Sister.  Pregnancy / Birth History (Ammie Eversole, LPN; 01/13/2017 2:17 PM) Age at menarche  10 years. Gravida  3 Irregular periods  Maternal age  21-25 Para  3  Other Problems (Ammie Eversole, LPN; 01/13/2017 2:17 PM) Back Pain  Gastroesophageal Reflux Disease  Heart murmur  High blood pressure  Thyroid Disease     Review of Systems (Ammie Eversole LPN; 01/13/2017 2:17 PM) General Present- Night Sweats and  Weight Gain. Not Present- Appetite Loss,   Chills, Fatigue, Fever and Weight Loss. Skin Present- Dryness and Rash. Not Present- Change in Wart/Mole, Hives, Jaundice, New Lesions, Non-Healing Wounds and Ulcer. HEENT Present- Seasonal Allergies and Wears glasses/contact lenses. Not Present- Earache, Hearing Loss, Hoarseness, Nose Bleed, Oral Ulcers, Ringing in the Ears, Sinus Pain, Sore Throat, Visual Disturbances and Yellow Eyes. Respiratory Not Present- Bloody sputum, Chronic Cough, Difficulty Breathing, Snoring and Wheezing. Breast Not Present- Breast Mass, Breast Pain, Nipple Discharge and Skin Changes. Cardiovascular Not Present- Chest Pain, Difficulty Breathing Lying Down, Leg Cramps, Palpitations, Rapid Heart Rate, Shortness of Breath and Swelling of Extremities. Gastrointestinal Present- Difficulty Swallowing, Excessive gas and Indigestion. Not Present- Abdominal Pain, Bloating, Bloody Stool, Change in Bowel Habits, Chronic diarrhea, Constipation, Gets full quickly at meals, Hemorrhoids, Nausea, Rectal Pain and Vomiting. Female Genitourinary Not Present- Frequency, Nocturia, Painful Urination, Pelvic Pain and Urgency. Musculoskeletal Present- Back Pain. Not Present- Joint Pain, Joint Stiffness, Muscle Pain, Muscle Weakness and Swelling of Extremities. Neurological Present- Headaches. Not Present- Decreased Memory, Fainting, Numbness, Seizures, Tingling, Tremor, Trouble walking and Weakness. Psychiatric Present- Anxiety and Change in Sleep Pattern. Not Present- Bipolar, Depression, Fearful and Frequent crying. Endocrine Present- Hair Changes, Heat Intolerance and Hot flashes. Not Present- Cold Intolerance, Excessive Hunger and New Diabetes. Hematology Not Present- Blood Thinners, Easy Bruising, Excessive bleeding, Gland problems, HIV and Persistent Infections.  Vitals (Ammie Eversole LPN; 01/13/2017 2:18 PM) 01/13/2017 2:17 PM Weight: 188 lb Height: 66in Body Surface Area: 1.95 m Body Mass  Index: 30.34 kg/m  Temp.: 98.4F(Oral)  Pulse: 88 (Regular)  BP: 148/80 (Sitting, Left Arm, Standard)       Physical Exam (Berma Harts MD; 01/13/2017 3:17 PM) General Mental Status-Alert. General Appearance-Consistent with stated age. Hydration-Well hydrated. Voice-Normal.  Head and Neck Note: Left thyroid fullness   Cardiovascular Cardiovascular examination reveals -normal heart sounds, regular rate and rhythm with no murmurs and normal pedal pulses bilaterally.  Abdomen Inspection Inspection of the abdomen reveals - No Hernias. Skin - Scar - no surgical scars. Palpation/Percussion Palpation and Percussion of the abdomen reveal - Soft, Non Tender, No Rebound tenderness, No Rigidity (guarding) and No hepatosplenomegaly. Auscultation Auscultation of the abdomen reveals - Bowel sounds normal.  Neurologic Neurologic evaluation reveals -alert and oriented x 3 with no impairment of recent or remote memory. Mental Status-Normal.  Musculoskeletal Normal Exam - Left-Upper Extremity Strength Normal and Lower Extremity Strength Normal. Normal Exam - Right-Upper Extremity Strength Normal and Lower Extremity Strength Normal.    Assessment & Plan (Derek Huneycutt MD; 01/13/2017 3:18 PM) THYROID NODULE (E04.1) Impression: 55-year-old female with symptomatic left thyroid nodule. 1. Will proceed to the operating room for a left thyroid lobectomy. 2. All risks and benefits were discussed with the patient to generally include: infection, bleeding, damage to the recurrent laryngeal nerve, damage to parathyroid glands, and possible need for further surgery. Alternatives were offered and described. All questions were answered and the patient voiced understanding of the procedure and wishes to proceed. 

## 2017-01-25 ENCOUNTER — Telehealth: Payer: Self-pay

## 2017-01-25 NOTE — Telephone Encounter (Signed)
Pt is having her thyroid removed on 02/10/17.  She is requesting that her work note be extended until after the surgery. Please advise.

## 2017-01-25 NOTE — Telephone Encounter (Signed)
Patient should have her surgeon provide work note since they can advise of recovery time etc.

## 2017-01-26 NOTE — Telephone Encounter (Signed)
Pt.notified

## 2017-02-01 NOTE — Pre-Procedure Instructions (Signed)
Hailey Robinson  02/01/2017      [facility] - [place], [place] - [address] [place] [address] Phone: [phone] Fax: [phone]    Your procedure is scheduled on Wednesday March 21.  Report to [facility] Admitting at 12:00 P.M.  Call this number if you have problems the morning of surgery:  [phone]   Remember:  Do not eat food or drink liquids after midnight.  Take these medicines the morning of surgery with A SIP OF WATER: loratadine  7 days prior to surgery STOP taking any Aspirin, Aleve, Naproxen, Ibuprofen, Motrin, Advil, Goody's, BC's, all herbal medications, fish oil, and all vitamins    Do not wear jewelry, make-up or nail polish.  Do not wear lotions, powders, or perfumes, or deoderant.  Do not shave 48 hours prior to surgery.  Men may shave face and neck.  Do not bring valuables to the hospital.  [facility] is not responsible for any belongings or valuables.  Contacts, dentures or bridgework may not be worn into surgery.  Leave your suitcase in the car.  After surgery it may be brought to your room.  For patients admitted to the hospital, discharge time will be determined by your treatment team.  Patients discharged the day of surgery will not be allowed to drive home.   Special instructions:    [facility]- Preparing For Surgery  Before surgery, you can play an important role. Because skin is not sterile, your skin needs to be as free of germs as possible. You can reduce the number of germs on your skin by washing with CHG (chlorahexidine gluconate) Soap before surgery.  CHG is an antiseptic cleaner which kills germs and bonds with the skin to continue killing germs even after washing.  Please do not use if you have an allergy to CHG or antibacterial soaps. If your skin becomes reddened/irritated stop using the CHG.  Do not shave (including legs and  underarms) for at least 48 hours prior to first CHG shower. It is OK to shave your face.  Please follow these instructions carefully.   1. Shower the NIGHT BEFORE SURGERY and the MORNING OF SURGERY with CHG.   2. If you chose to wash your hair, wash your hair first as usual with your normal shampoo.  3. After you shampoo, rinse your hair and body thoroughly to remove the shampoo.  4. Use CHG as you would any other liquid soap. You can apply CHG directly to the skin and wash gently with a scrungie or a clean washcloth.   5. Apply the CHG Soap to your body ONLY FROM THE NECK DOWN.  Do not use on open wounds or open sores. Avoid contact with your eyes, ears, mouth and genitals (private parts). Wash genitals (private parts) with your normal soap.  6. Wash thoroughly, paying special attention to the area where your surgery will be performed.  7. Thoroughly rinse your body with warm water from the neck down.  8. DO NOT shower/wash with your normal soap after using and rinsing off the CHG Soap.  9. Pat yourself dry with a CLEAN TOWEL.   10. Wear CLEAN PAJAMAS   11. Place CLEAN SHEETS on your bed the night of your first shower and DO NOT SLEEP WITH PETS.    Day of Surgery: Do not apply any deodorants/lotions. Please wear clean clothes to the hospital/surgery center.  Please read over the following fact sheets that you were given. Surgical Site Infection Prevention

## 2017-02-02 ENCOUNTER — Encounter (HOSPITAL_COMMUNITY): Payer: Self-pay

## 2017-02-02 ENCOUNTER — Ambulatory Visit (HOSPITAL_COMMUNITY)
Admission: RE | Admit: 2017-02-02 | Discharge: 2017-02-02 | Disposition: A | Discharge status: Home / Self Care | Payer: Federal, State, Local not specified - PPO | Source: Ambulatory Visit | Attending: General Surgery | Admitting: General Surgery

## 2017-02-02 ENCOUNTER — Encounter (HOSPITAL_COMMUNITY)
Admission: RE | Admit: 2017-02-02 | Discharge: 2017-02-02 | Disposition: A | Discharge status: Home / Self Care | Payer: Federal, State, Local not specified - PPO | Source: Ambulatory Visit | Attending: General Surgery | Admitting: General Surgery

## 2017-02-02 DIAGNOSIS — E041 Nontoxic single thyroid nodule: Secondary | ICD-10-CM | POA: Insufficient documentation

## 2017-02-02 DIAGNOSIS — Z01812 Encounter for preprocedural laboratory examination: Secondary | ICD-10-CM | POA: Insufficient documentation

## 2017-02-02 DIAGNOSIS — R918 Other nonspecific abnormal finding of lung field: Secondary | ICD-10-CM | POA: Diagnosis not present

## 2017-02-02 DIAGNOSIS — Z01810 Encounter for preprocedural cardiovascular examination: Secondary | ICD-10-CM | POA: Insufficient documentation

## 2017-02-02 DIAGNOSIS — Z01818 Encounter for other preprocedural examination: Secondary | ICD-10-CM | POA: Insufficient documentation

## 2017-02-02 HISTORY — DX: Gastro-esophageal reflux disease without esophagitis: K21.9

## 2017-02-02 LAB — CBC
HEMATOCRIT: 30.2 % — AB (ref 36.0–46.0)
HEMOGLOBIN: 8.1 g/dL — AB (ref 12.0–15.0)
MCH: 18.8 pg — AB (ref 26.0–34.0)
MCHC: 26.8 g/dL — AB (ref 30.0–36.0)
MCV: 70.1 fL — AB (ref 78.0–100.0)
Platelets: 337 10*3/uL (ref 150–400)
RBC: 4.31 MIL/uL (ref 3.87–5.11)
RDW: 18 % — ABNORMAL HIGH (ref 11.5–15.5)
WBC: 4.8 10*3/uL (ref 4.0–10.5)

## 2017-02-02 LAB — BASIC METABOLIC PANEL
Anion gap: 10 (ref 5–15)
BUN: 7 mg/dL (ref 6–20)
CHLORIDE: 101 mmol/L (ref 101–111)
CO2: 28 mmol/L (ref 22–32)
CREATININE: 0.65 mg/dL (ref 0.44–1.00)
Calcium: 9 mg/dL (ref 8.9–10.3)
GFR calc non Af Amer: >60 mL/min (ref >60)
Glucose, Bld: 97 mg/dL (ref 65–99)
Potassium: 2.9 mmol/L — ABNORMAL LOW (ref 3.5–5.1)
Sodium: 139 mmol/L (ref 135–145)

## 2017-02-02 LAB — HCG, SERUM, QUALITATIVE: Preg, Serum: NEGATIVE

## 2017-02-02 MED ORDER — CHLORHEXIDINE GLUCONATE CLOTH 2 % EX PADS: 6.0000 | MEDICATED_PAD | Freq: Once | CUTANEOUS | Status: DC

## 2017-02-02 NOTE — Progress Notes (Addendum)
Spoke with Nurse at Dr. Rosendo Gros' office. Paging MD d/t potassium 2.9 and Hgb 8.1 per RN the office will contact patient. Number given to call back with follow up. Per Dr. Oda Cogan, pt given instructions to increase potassium dose at home and take iron supplement. Labs to be re-drawn DOS.   Chart forwarded to anesthesia for review.

## 2017-02-02 NOTE — Progress Notes (Signed)
PCP: Rob Bunting No cardiologist or cardiac hx per pt other than heart murmur.  ECHO: 2017 EKG: 02/02/17 CXR: 02/02/17  No chest pain, SOB or signs of infection at PAT appointment.

## 2017-02-03 ENCOUNTER — Encounter: Payer: Self-pay | Admitting: Physician Assistant

## 2017-02-03 ENCOUNTER — Telehealth: Payer: Self-pay | Admitting: Physician Assistant

## 2017-02-03 DIAGNOSIS — Z1239 Encounter for other screening for malignant neoplasm of breast: Secondary | ICD-10-CM

## 2017-02-03 DIAGNOSIS — N920 Excessive and frequent menstruation with regular cycle: Secondary | ICD-10-CM | POA: Insufficient documentation

## 2017-02-03 DIAGNOSIS — D649 Anemia, unspecified: Secondary | ICD-10-CM | POA: Insufficient documentation

## 2017-02-03 DIAGNOSIS — Z1211 Encounter for screening for malignant neoplasm of colon: Secondary | ICD-10-CM

## 2017-02-03 HISTORY — DX: Anemia, unspecified: D64.9

## 2017-02-03 NOTE — Telephone Encounter (Signed)
Called patient to discuss pre-admission testing labs, which showed worsening anemia (hgb 8.1). Patient does report heavy menstrual cycles. She denies melena and hematochezia. She has never had a colonoscopy.    Discussed with patient that I would like to order an iron panel and her screening colonoscopy.  We are also going to get her other preventive care up to date, including mammogram and Pap smear.   Patient expressed understanding and agrees with this plan. She will be available in early April after her thyroid lobectomy.

## 2017-02-03 NOTE — Progress Notes (Addendum)
Anesthesia Chart Review:  Pt is a 56 year old female scheduled for L thyroid lobectomy on 02/10/2017 with Ralene Ok, MD.    PCP is Nelson Chimes, PA  PMH includes:  HTN, functional systolic murmur, GERD.  Current smoker. BMI 30.5  Medications include: HCTZ, potassium  Preoperative labs reviewed.   - K 2.9.  - H/H 8.1/30.2.  Prior hgb result 11/04/16 was 9.4. - PAT RN notified Dr. Johney Frame office who had pt increase her potassium and take iron.  BMET and CBC will be repeated DOS.  - I sent a staff message to PCP about anemia who acknowledged result.    CXR 02/02/17: Rightward deviation of the upper thoracic trachea, likely due to left-sided thyroid enlargement. No edema or consolidation. No evident adenopathy.  EKG 02/02/17: NSR. LVH with repolarization abnormality. Prolonged QT  Echo 08/17/16 (care everywhere):  - There is mild concentric left ventricular hypertrophy. - No segmental wall motion abnormalities seen in the left ventricle - The right ventricle is normal in size and function. - The left atrial size is normal. - The aortic valve is normal in structure and function. - There is trace mitral regurgitation. - There is mild mitral annular calcification. - There is trace tricuspid regurgitation. - There is no pericardial effusion. - Trace to mild pulmonic valvular regurgitation. - There is no comparison study available.  Reviewed case with Dr. Therisa Doyne. If no changes, I anticipate pt can proceed as scheduled.   Willeen Cass, FNP-BC [facility]/Anesthesiology Phone: [phone] 02/04/2017 3:55 PM

## 2017-02-04 ENCOUNTER — Telehealth: Payer: Self-pay

## 2017-02-04 NOTE — Telephone Encounter (Signed)
I recommend she try Melatonin 3mg  extended release. I do not want to add any sedating medications right now that could interfere with breathing prior to her surgery. We can revisit this after her surgery

## 2017-02-04 NOTE — Telephone Encounter (Signed)
Pt notified and verbalized understanding. -EH/RMA

## 2017-02-04 NOTE — Telephone Encounter (Signed)
Pt reports that she is having trouble sleeping at night.  She is currently using melatonin but that isn't working for her. She is requesting for a sleep aid Rx. Please advise. -EH/RMA

## 2017-02-10 ENCOUNTER — Observation Stay (HOSPITAL_COMMUNITY)
Admission: RE | Admit: 2017-02-10 | Discharge: 2017-02-11 | Disposition: A | Discharge status: Home / Self Care | Payer: Federal, State, Local not specified - PPO | Source: Ambulatory Visit | Attending: General Surgery | Admitting: General Surgery

## 2017-02-10 ENCOUNTER — Encounter (HOSPITAL_COMMUNITY)
Admission: RE | Disposition: A | Discharge status: Home / Self Care | Payer: Self-pay | Source: Ambulatory Visit | Attending: General Surgery

## 2017-02-10 ENCOUNTER — Encounter (HOSPITAL_COMMUNITY): Payer: Self-pay | Admitting: *Deleted

## 2017-02-10 ENCOUNTER — Ambulatory Visit (HOSPITAL_COMMUNITY): Payer: Federal, State, Local not specified - PPO | Admitting: Vascular Surgery

## 2017-02-10 ENCOUNTER — Ambulatory Visit (HOSPITAL_COMMUNITY): Payer: Federal, State, Local not specified - PPO | Admitting: Certified Registered"

## 2017-02-10 DIAGNOSIS — Z79899 Other long term (current) drug therapy: Secondary | ICD-10-CM | POA: Diagnosis not present

## 2017-02-10 DIAGNOSIS — Z9889 Other specified postprocedural states: Secondary | ICD-10-CM

## 2017-02-10 DIAGNOSIS — I10 Essential (primary) hypertension: Secondary | ICD-10-CM | POA: Diagnosis not present

## 2017-02-10 DIAGNOSIS — J302 Other seasonal allergic rhinitis: Secondary | ICD-10-CM | POA: Diagnosis not present

## 2017-02-10 DIAGNOSIS — D34 Benign neoplasm of thyroid gland: Principal | ICD-10-CM | POA: Insufficient documentation

## 2017-02-10 DIAGNOSIS — F17200 Nicotine dependence, unspecified, uncomplicated: Secondary | ICD-10-CM | POA: Diagnosis not present

## 2017-02-10 DIAGNOSIS — E041 Nontoxic single thyroid nodule: Secondary | ICD-10-CM | POA: Diagnosis not present

## 2017-02-10 DIAGNOSIS — E890 Postprocedural hypothyroidism: Secondary | ICD-10-CM

## 2017-02-10 HISTORY — PX: THYROID LOBECTOMY: SHX420

## 2017-02-10 HISTORY — DX: Anemia, unspecified: D64.9

## 2017-02-10 HISTORY — DX: Unspecified osteoarthritis, unspecified site: M19.90

## 2017-02-10 HISTORY — DX: Other seasonal allergic rhinitis: J30.2

## 2017-02-10 HISTORY — DX: Pneumonia, unspecified organism: J18.9

## 2017-02-10 LAB — CBC
HCT: 33 % — ABNORMAL LOW (ref 36.0–46.0)
HEMOGLOBIN: 9.2 g/dL — AB (ref 12.0–15.0)
MCH: 20.5 pg — ABNORMAL LOW (ref 26.0–34.0)
MCHC: 27.9 g/dL — AB (ref 30.0–36.0)
MCV: 73.7 fL — AB (ref 78.0–100.0)
Platelets: 305 10*3/uL (ref 150–400)
RBC: 4.48 MIL/uL (ref 3.87–5.11)
RDW: 21.8 % — ABNORMAL HIGH (ref 11.5–15.5)
WBC: 6.8 10*3/uL (ref 4.0–10.5)

## 2017-02-10 LAB — BASIC METABOLIC PANEL
Anion gap: 12 (ref 5–15)
BUN: 6 mg/dL (ref 6–20)
CHLORIDE: 103 mmol/L (ref 101–111)
CO2: 24 mmol/L (ref 22–32)
Calcium: 9.4 mg/dL (ref 8.9–10.3)
Creatinine, Ser: 0.58 mg/dL (ref 0.44–1.00)
GLUCOSE: 83 mg/dL (ref 65–99)
Potassium: 3.3 mmol/L — ABNORMAL LOW (ref 3.5–5.1)
SODIUM: 139 mmol/L (ref 135–145)

## 2017-02-10 SURGERY — LOBECTOMY, THYROID
Anesthesia: General | Site: Neck | Laterality: Left

## 2017-02-10 MED ORDER — KETOROLAC TROMETHAMINE 30 MG/ML IJ SOLN: 30.0000 mg | Freq: Once | INTRAMUSCULAR | Status: DC

## 2017-02-10 MED ORDER — PROPOFOL 10 MG/ML IV BOLUS
INTRAVENOUS | Status: DC | PRN
  Administered 2017-02-10: 200 mg via INTRAVENOUS

## 2017-02-10 MED ORDER — FENTANYL CITRATE (PF) 100 MCG/2ML IJ SOLN
INTRAMUSCULAR | Status: DC | PRN
  Administered 2017-02-10: 50 ug via INTRAVENOUS
  Administered 2017-02-10: 100 ug via INTRAVENOUS
  Administered 2017-02-10: 50 ug via INTRAVENOUS

## 2017-02-10 MED ORDER — SUGAMMADEX SODIUM 200 MG/2ML IV SOLN
INTRAVENOUS | Status: DC | PRN
  Administered 2017-02-10: 100 mg via INTRAVENOUS

## 2017-02-10 MED ORDER — SUGAMMADEX SODIUM 200 MG/2ML IV SOLN
INTRAVENOUS | Status: AC
  Filled 2017-02-10: qty 2

## 2017-02-10 MED ORDER — OXYCODONE HCL 5 MG PO TABS
5.0000 mg | ORAL_TABLET | ORAL | Status: DC | PRN
  Administered 2017-02-10 – 2017-02-11 (×2): 10 mg via ORAL
  Filled 2017-02-10 (×2): qty 2

## 2017-02-10 MED ORDER — ONDANSETRON 4 MG PO TBDP: 4.0000 mg | ORAL_TABLET | Freq: Four times a day (QID) | ORAL | Status: DC | PRN

## 2017-02-10 MED ORDER — ONDANSETRON HCL 4 MG/2ML IJ SOLN
INTRAMUSCULAR | Status: DC | PRN
  Administered 2017-02-10: 4 mg via INTRAVENOUS

## 2017-02-10 MED ORDER — PROMETHAZINE HCL 25 MG/ML IJ SOLN: 6.2500 mg | INTRAMUSCULAR | Status: DC | PRN

## 2017-02-10 MED ORDER — 0.9 % SODIUM CHLORIDE (POUR BTL) OPTIME
TOPICAL | Status: DC | PRN
  Administered 2017-02-10: 1000 mL

## 2017-02-10 MED ORDER — VITAMIN E 45 MG (100 UNIT) PO CAPS
200.0000 [IU] | ORAL_CAPSULE | Freq: Every day | ORAL | Status: DC
  Administered 2017-02-11: 200 [IU] via ORAL
  Filled 2017-02-10: qty 2

## 2017-02-10 MED ORDER — CEFAZOLIN SODIUM-DEXTROSE 2-4 GM/100ML-% IV SOLN
2.0000 g | INTRAVENOUS | Status: AC
  Administered 2017-02-10: 2 g via INTRAVENOUS
  Filled 2017-02-10: qty 100

## 2017-02-10 MED ORDER — HYDROMORPHONE HCL 1 MG/ML IJ SOLN
0.2500 mg | INTRAMUSCULAR | Status: DC | PRN
  Administered 2017-02-10 (×2): 0.5 mg via INTRAVENOUS

## 2017-02-10 MED ORDER — MIDAZOLAM HCL 2 MG/2ML IJ SOLN
INTRAMUSCULAR | Status: AC
  Filled 2017-02-10: qty 2

## 2017-02-10 MED ORDER — ONDANSETRON HCL 4 MG/2ML IJ SOLN
INTRAMUSCULAR | Status: AC
  Filled 2017-02-10: qty 2

## 2017-02-10 MED ORDER — LIDOCAINE 2% (20 MG/ML) 5 ML SYRINGE
INTRAMUSCULAR | Status: DC | PRN
  Administered 2017-02-10: 100 mg via INTRAVENOUS

## 2017-02-10 MED ORDER — HYDROCHLOROTHIAZIDE 25 MG PO TABS
25.0000 mg | ORAL_TABLET | Freq: Every day | ORAL | Status: DC
  Administered 2017-02-11: 25 mg via ORAL
  Filled 2017-02-10: qty 1

## 2017-02-10 MED ORDER — MEPERIDINE HCL 25 MG/ML IJ SOLN: 6.2500 mg | INTRAMUSCULAR | Status: DC | PRN

## 2017-02-10 MED ORDER — BUPIVACAINE HCL (PF) 0.25 % IJ SOLN
INTRAMUSCULAR | Status: AC
  Filled 2017-02-10: qty 30

## 2017-02-10 MED ORDER — BUPIVACAINE HCL 0.25 % IJ SOLN
INTRAMUSCULAR | Status: DC | PRN
  Administered 2017-02-10: 30 mL

## 2017-02-10 MED ORDER — HEMOSTATIC AGENTS (NO CHARGE) OPTIME
TOPICAL | Status: DC | PRN
  Administered 2017-02-10: 1 via TOPICAL

## 2017-02-10 MED ORDER — PROPOFOL 10 MG/ML IV BOLUS
INTRAVENOUS | Status: AC
  Filled 2017-02-10: qty 20

## 2017-02-10 MED ORDER — MIDAZOLAM HCL 5 MG/5ML IJ SOLN
INTRAMUSCULAR | Status: DC | PRN
  Administered 2017-02-10: 2 mg via INTRAVENOUS

## 2017-02-10 MED ORDER — ACETAMINOPHEN 10 MG/ML IV SOLN
INTRAVENOUS | Status: DC | PRN
  Administered 2017-02-10: 1000 mg via INTRAVENOUS

## 2017-02-10 MED ORDER — LACTATED RINGERS IV SOLN
INTRAVENOUS | Status: DC
Start: 2017-02-10 — End: 2017-02-10
  Administered 2017-02-10: 12:00:00 via INTRAVENOUS

## 2017-02-10 MED ORDER — DEXTROSE-NACL 5-0.9 % IV SOLN
INTRAVENOUS | Status: DC
  Administered 2017-02-11: 06:00:00 via INTRAVENOUS

## 2017-02-10 MED ORDER — LIDOCAINE 2% (20 MG/ML) 5 ML SYRINGE
INTRAMUSCULAR | Status: AC
  Filled 2017-02-10: qty 5

## 2017-02-10 MED ORDER — ONDANSETRON HCL 4 MG/2ML IJ SOLN: 4.0000 mg | Freq: Four times a day (QID) | INTRAMUSCULAR | Status: DC | PRN

## 2017-02-10 MED ORDER — ROCURONIUM BROMIDE 10 MG/ML (PF) SYRINGE
PREFILLED_SYRINGE | INTRAVENOUS | Status: DC | PRN
Start: 2017-02-10 — End: 2017-02-10
  Administered 2017-02-10: 35 mg via INTRAVENOUS

## 2017-02-10 MED ORDER — HYDROMORPHONE HCL 1 MG/ML IJ SOLN
INTRAMUSCULAR | Status: AC
  Filled 2017-02-10: qty 1

## 2017-02-10 MED ORDER — PHENOL 1.4 % MT LIQD
1.0000 | OROMUCOSAL | Status: DC | PRN
  Administered 2017-02-10: 1 via OROMUCOSAL
  Filled 2017-02-10: qty 177

## 2017-02-10 MED ORDER — HYDROMORPHONE HCL 1 MG/ML IJ SOLN
1.0000 mg | INTRAMUSCULAR | Status: DC | PRN
  Administered 2017-02-10: 1 mg via INTRAVENOUS
  Filled 2017-02-10: qty 1

## 2017-02-10 MED ORDER — POTASSIUM CHLORIDE CRYS ER 20 MEQ PO TBCR
20.0000 meq | EXTENDED_RELEASE_TABLET | Freq: Every day | ORAL | Status: DC
  Administered 2017-02-11: 20 meq via ORAL
  Filled 2017-02-10: qty 1

## 2017-02-10 MED ORDER — IBUPROFEN 600 MG PO TABS: 600.0000 mg | ORAL_TABLET | Freq: Three times a day (TID) | ORAL | Status: DC | PRN

## 2017-02-10 MED ORDER — ACETAMINOPHEN 10 MG/ML IV SOLN
INTRAVENOUS | Status: AC
  Filled 2017-02-10: qty 100

## 2017-02-10 MED ORDER — FENTANYL CITRATE (PF) 100 MCG/2ML IJ SOLN
INTRAMUSCULAR | Status: AC
  Filled 2017-02-10: qty 4

## 2017-02-10 SURGICAL SUPPLY — 55 items
ATTRACTOMAT 16X20 MAGNETIC DRP (DRAPES) ×2 IMPLANT
BLADE SURG 15 STRL LF DISP TIS (BLADE) ×1 IMPLANT
BLADE SURG 15 STRL SS (BLADE) ×1
CANISTER SUCT 3000ML PPV (MISCELLANEOUS) ×2 IMPLANT
CHLORAPREP W/TINT 26ML (MISCELLANEOUS) ×2 IMPLANT
CLIP TI MEDIUM 24 (CLIP) ×2 IMPLANT
CLIP TI WIDE RED SMALL 24 (CLIP) ×2 IMPLANT
CONT SPEC 4OZ CLIKSEAL STRL BL (MISCELLANEOUS) ×2 IMPLANT
COVER SURGICAL LIGHT HANDLE (MISCELLANEOUS) ×2 IMPLANT
CRADLE DONUT ADULT HEAD (MISCELLANEOUS) ×2 IMPLANT
DERMABOND ADVANCED (GAUZE/BANDAGES/DRESSINGS) ×1
DERMABOND ADVANCED .7 DNX12 (GAUZE/BANDAGES/DRESSINGS) ×1 IMPLANT
DRAPE LAPAROTOMY 100X72 PEDS (DRAPES) ×2 IMPLANT
DRAPE UTILITY XL STRL (DRAPES) ×4 IMPLANT
ELECT COATED BLADE 2.86 ST (ELECTRODE) ×2 IMPLANT
ELECT REM PT RETURN 9FT ADLT (ELECTROSURGICAL) ×2
ELECTRODE REM PT RTRN 9FT ADLT (ELECTROSURGICAL) ×1 IMPLANT
GAUZE SPONGE 4X4 12PLY STRL (GAUZE/BANDAGES/DRESSINGS) ×2 IMPLANT
GAUZE SPONGE 4X4 16PLY XRAY LF (GAUZE/BANDAGES/DRESSINGS) ×4 IMPLANT
GLOVE BIO SURGEON STRL SZ 6.5 (GLOVE) ×2 IMPLANT
GLOVE BIO SURGEON STRL SZ7.5 (GLOVE) ×2 IMPLANT
GLOVE BIOGEL PI IND STRL 6.5 (GLOVE) ×3 IMPLANT
GLOVE BIOGEL PI INDICATOR 6.5 (GLOVE) ×3
GLOVE SURG SS PI 6.0 STRL IVOR (GLOVE) ×2 IMPLANT
GLOVE SURG SS PI 6.5 STRL IVOR (GLOVE) ×2 IMPLANT
GOWN STRL NON-REIN LRG LVL3 (GOWN DISPOSABLE) ×4 IMPLANT
GOWN STRL REUS W/ TWL LRG LVL3 (GOWN DISPOSABLE) ×2 IMPLANT
GOWN STRL REUS W/ TWL XL LVL3 (GOWN DISPOSABLE) ×1 IMPLANT
GOWN STRL REUS W/TWL LRG LVL3 (GOWN DISPOSABLE) ×2
GOWN STRL REUS W/TWL XL LVL3 (GOWN DISPOSABLE) ×1
HEMOSTAT SURGICEL 2X4 FIBR (HEMOSTASIS) ×2 IMPLANT
KIT BASIN OR (CUSTOM PROCEDURE TRAY) ×2 IMPLANT
KIT ROOM TURNOVER OR (KITS) ×2 IMPLANT
NEEDLE HYPO 25GX1X1/2 BEV (NEEDLE) ×2 IMPLANT
NS IRRIG 1000ML POUR BTL (IV SOLUTION) ×2 IMPLANT
PACK SURGICAL SETUP 50X90 (CUSTOM PROCEDURE TRAY) ×2 IMPLANT
PAD ARMBOARD 7.5X6 YLW CONV (MISCELLANEOUS) ×2 IMPLANT
PENCIL BUTTON HOLSTER BLD 10FT (ELECTRODE) ×2 IMPLANT
SHEARS HARMONIC 9CM CVD (BLADE) ×2 IMPLANT
SPECIMEN JAR MEDIUM (MISCELLANEOUS) IMPLANT
SPONGE GAUZE 4X4 12PLY STER LF (GAUZE/BANDAGES/DRESSINGS) ×2 IMPLANT
SPONGE INTESTINAL PEANUT (DISPOSABLE) ×2 IMPLANT
SUT MNCRL AB 4-0 PS2 18 (SUTURE) ×2 IMPLANT
SUT SILK 0 TIES 10X30 (SUTURE) ×2 IMPLANT
SUT SILK 2 0 (SUTURE) ×1
SUT SILK 2-0 18XBRD TIE 12 (SUTURE) ×1 IMPLANT
SUT SILK 3 0 (SUTURE) ×1
SUT SILK 3 0 SH 30 (SUTURE) ×2 IMPLANT
SUT SILK 3-0 18XBRD TIE 12 (SUTURE) ×1 IMPLANT
SUT VIC AB 3-0 SH 18 (SUTURE) ×4 IMPLANT
SYR BULB 3OZ (MISCELLANEOUS) ×2 IMPLANT
SYR CONTROL 10ML LL (SYRINGE) ×2 IMPLANT
TOWEL OR 17X24 6PK STRL BLUE (TOWEL DISPOSABLE) ×2 IMPLANT
TOWEL OR 17X26 10 PK STRL BLUE (TOWEL DISPOSABLE) ×2 IMPLANT
TUBE CONNECTING 12X1/4 (SUCTIONS) ×2 IMPLANT

## 2017-02-10 NOTE — H&P (View-Only) (Signed)
History of Present Illness Ralene Ok MD; 01/13/2017 3:17 PM) Patient words: thyroid.  The patient is a 56 year old female who presents with a thyroid nodule. The patient is a 56 year old female who is referred by Dr. Maisie Fus for evaluation of left thyroid nodule and dysphagia. Patient states that she had some choking spells with meals. She states that when she lies down she does also have some choking sensation.  Patient has had a known left-sided solid thyroid nodule. This measured 3.2 x 2.2 x 6.0 cm. This underwent FNA biopsy. This resulted in a benign follicular nodule. Patient was instructed for routine follow-up.  EXAM: THYROID ULTRASOUND  TECHNIQUE: Ultrasound examination of the thyroid gland and adjacent soft tissues was performed.  COMPARISON: None.  FINDINGS: Parenchymal Echotexture: Mildly heterogenous  Estimated total number of nodules >/= 1 cm: 1  Number of spongiform nodules >/= 2 cm not described below (TR1): 0  Number of mixed cystic and solid nodules >/= 1.5 cm not described below (TR2): 0  _________________________________________________________  Isthmus: Normal in size measuring 0.4 cm in diameter  No discrete nodules are identified within the thyroid isthmus.  _________________________________________________________  Right lobe: Borderline enlarged measuring 5.1 x 2.0 x 1.8 cm  No discrete nodules are identified within the right lobe of the thyroid.  _________________________________________________________  Left lobe: Enlarged measuring 10.1 x 4.0 x 3.7 cm  Nodule # 1:  Location: Left; Inferior  Size: 3.2 x 2.2 x 6.0 cm  Composition: solid/almost completely solid (2)  Echogenicity: isoechoic (1)  Shape: not taller-than-wide (0)  Margins: ill-defined (0)  Echogenic foci: none (0)  ACR TI-RADS total points: 3.  ACR TI-RADS risk category: TR3 (3 points).  ACR TI-RADS recommendations:  **Given size (>/= 2.5 cm) and  appearance, fine needle aspiration of this mildly suspicious nodule should be considered based on TI-RADS criteria.  IMPRESSION: Asymmetric enlargement of the left lobe of the thyroid secondary to an apparent 6 cm nodule/pseudo nodule within the posterior inferior aspect of the left lobe of the thyroid. This nodule/pseudo nodule meets imaging criteria to recommend percutaneous sampling as clinically indicated.  PAthology: Diagnosis THYROID, LEFT, FINE NEEDLE ASPIRATION (SPECIMEN 1 OF 1, COLLECTED ON 12/17/2016): CONSISTENT WITH BENIGN FOLLICULAR NODULE (BETHESDA CATEGORY II).   Past Surgical History (Ammie Eversole, LPN; 6/65/9935 7:01 PM) No pertinent past surgical history   Diagnostic Studies History (Ammie Eversole, LPN; 7/79/3903 0:09 PM) Colonoscopy  never Mammogram  never Pap Smear  1-5 years ago  Allergies (Ammie Eversole, LPN; 2/33/0076 2:26 PM) No Known Drug Allergies 01/13/2017  Medication History (Ammie Eversole, LPN; 3/33/5456 2:56 PM) HydroCHLOROthiazide (25MG  Tablet, Oral) Active. K-[facility] Tablet ER, Oral) Active. Vitamin D (Cholecalciferol) (1000UNIT Capsule, Oral) Active. Medications Reconciled  Social History (Aleatha Borer, LPN; 3/89/3734 2:87 PM) Alcohol use  Occasional alcohol use. Caffeine use  Coffee, Tea. No drug use  Tobacco use  Current every day smoker.  Family History Aleatha Borer, LPN; 6/81/1572 6:20 PM) Arthritis  Mother. Hypertension  Father, Mother. Kidney Disease  Mother. Thyroid problems  Sister.  Pregnancy / Birth History Aleatha Borer, LPN; 3/55/9741 6:38 PM) Age at menarche  69 years. Gravida  3 Irregular periods  Maternal age  49-25 Para  50  Other Problems (Ammie Eversole, LPN; 4/53/6468 0:32 PM) Back Pain  Gastroesophageal Reflux Disease  Heart murmur  High blood pressure  Thyroid Disease     Review of Systems (Ammie Eversole LPN; 12/15/4823 0:03 PM) General Present- Night Sweats and  Weight Gain. Not Present- Appetite Loss,  Chills, Fatigue, Fever and Weight Loss. Skin Present- Dryness and Rash. Not Present- Change in Wart/Mole, Hives, Jaundice, New Lesions, Non-Healing Wounds and Ulcer. HEENT Present- Seasonal Allergies and Wears glasses/contact lenses. Not Present- Earache, Hearing Loss, Hoarseness, Nose Bleed, Oral Ulcers, Ringing in the Ears, Sinus Pain, Sore Throat, Visual Disturbances and Yellow Eyes. Respiratory Not Present- Bloody sputum, Chronic Cough, Difficulty Breathing, Snoring and Wheezing. Breast Not Present- Breast Mass, Breast Pain, Nipple Discharge and Skin Changes. Cardiovascular Not Present- Chest Pain, Difficulty Breathing Lying Down, Leg Cramps, Palpitations, Rapid Heart Rate, Shortness of Breath and Swelling of Extremities. Gastrointestinal Present- Difficulty Swallowing, Excessive gas and Indigestion. Not Present- Abdominal Pain, Bloating, Bloody Stool, Change in Bowel Habits, Chronic diarrhea, Constipation, Gets full quickly at meals, Hemorrhoids, Nausea, Rectal Pain and Vomiting. Female Genitourinary Not Present- Frequency, Nocturia, Painful Urination, Pelvic Pain and Urgency. Musculoskeletal Present- Back Pain. Not Present- Joint Pain, Joint Stiffness, Muscle Pain, Muscle Weakness and Swelling of Extremities. Neurological Present- Headaches. Not Present- Decreased Memory, Fainting, Numbness, Seizures, Tingling, Tremor, Trouble walking and Weakness. Psychiatric Present- Anxiety and Change in Sleep Pattern. Not Present- Bipolar, Depression, Fearful and Frequent crying. Endocrine Present- Hair Changes, Heat Intolerance and Hot flashes. Not Present- Cold Intolerance, Excessive Hunger and New Diabetes. Hematology Not Present- Blood Thinners, Easy Bruising, Excessive bleeding, Gland problems, HIV and Persistent Infections.  Vitals (Ammie Eversole LPN; 07/10/5630 4:97 PM) 01/13/2017 2:17 PM Weight: 188 lb Height: 66in Body Surface Area: 1.95 m Body Mass  Index: 30.34 kg/m  Temp.: 98.19F(Oral)  Pulse: 88 (Regular)  BP: 148/80 (Sitting, Left Arm, Standard)       Physical Exam Ralene Ok MD; 01/13/2017 3:17 PM) General Mental Status-Alert. General Appearance-Consistent with stated age. Hydration-Well hydrated. Voice-Normal.  Head and Neck Note: Left thyroid fullness   Cardiovascular Cardiovascular examination reveals -normal heart sounds, regular rate and rhythm with no murmurs and normal pedal pulses bilaterally.  Abdomen Inspection Inspection of the abdomen reveals - No Hernias. Skin - Scar - no surgical scars. Palpation/Percussion Palpation and Percussion of the abdomen reveal - Soft, Non Tender, No Rebound tenderness, No Rigidity (guarding) and No hepatosplenomegaly. Auscultation Auscultation of the abdomen reveals - Bowel sounds normal.  Neurologic Neurologic evaluation reveals -alert and oriented x 3 with no impairment of recent or remote memory. Mental Status-Normal.  Musculoskeletal Normal Exam - Left-Upper Extremity Strength Normal and Lower Extremity Strength Normal. Normal Exam - Right-Upper Extremity Strength Normal and Lower Extremity Strength Normal.    Assessment & Plan Ralene Ok MD; 01/13/2017 3:18 PM) THYROID NODULE (E04.1) Impression: 56 year old female with symptomatic left thyroid nodule. 1. Will proceed to the operating room for a left thyroid lobectomy. 2. All risks and benefits were discussed with the patient to generally include: infection, bleeding, damage to the recurrent laryngeal nerve, damage to parathyroid glands, and possible need for further surgery. Alternatives were offered and described. All questions were answered and the patient voiced understanding of the procedure and wishes to proceed.

## 2017-02-10 NOTE — Discharge Instructions (Signed)
Thyroidectomy, Care After Refer to this sheet in the next few weeks. These instructions provide you with information about caring for yourself after your procedure. Your health care provider may also give you more specific instructions. Your treatment has been planned according to current medical practices, but problems sometimes occur. Call your health care provider if you have any problems or questions after your procedure. What can I expect after the procedure? After your procedure, it is typical to have:  Mild pain in the neck or upper body, especially when swallowing.  A sore throat.  A weak voice. Follow these instructions at home:  Take medicines only as directed by your health care provider.  If your entire thyroid gland was removed, you may need to take thyroid hormone medicine from now on.  Do not take medicines that contain aspirin and ibuprofen until your health care provider says that you can. These medicines can increase your risk of bleeding.  Some pain medicines cause constipation. Drink enough fluid to keep your urine clear or pale yellow. This can help to prevent constipation.  Start slowly with eating. You may need to have only liquids and soft foods for a few days or as directed by your health care provider.  Do not take baths, swim, or use a hot tub until your health care provider approves.  There are many different ways to close and cover an incision, including stitches (sutures), skin glue, and adhesive strips. Follow your health care provider's instructions for:  Incision care.  Bandage (dressing) changes and removal.  Incision closure removal.  Resume your usual activities as directed by your health care provider.  For the first 10 days after the procedure or as instructed by your health care provider:  Do not lift anything heavier than 20 lb (9.1 kg).  Do not jog, swim, or do other strenuous exercises.  Do not play contact sports.  Keep all  follow-up visits as directed by your health care provider. This is important. Contact a health care provider if:  The soreness in your throat gets worse.  You have increased pain at your incision or incisions.  You have increased bleeding from an incision.  Your incision becomes infected. Watch for:  Swelling.  Redness.  Warmth.  Pus.  You notice a bad smell coming from an incision or dressing.  You have a fever.  You feel lightheaded or faint.  You have numbness, tingling, or muscle spasms in your:  Arms.  Hands.  Feet.  Face.  You have trouble swallowing. Get help right away if:  You develop a rash.  You have difficulty breathing.  You hear whistling noises coming from your chest.  You develop a cough that gets worse.  Your speech changes, or you have hoarseness that gets worse. This information is not intended to replace advice given to you by your health care provider. Make sure you discuss any questions you have with your health care provider. Document Released: 05/29/2005 Document Revised: 07/12/2016 Document Reviewed: 04/11/2014 Elsevier Interactive Patient Education  2017 [employer].

## 2017-02-10 NOTE — Interval H&P Note (Signed)
History and Physical Interval Note:  02/10/2017 12:35 PM  Hailey Robinson  has presented today for surgery, with the diagnosis of Left thyroid nodule  The various methods of treatment have been discussed with the patient and family. After consideration of risks, benefits and other options for treatment, the patient has consented to  Procedure(s): LEFT THYROID LOBECTOMY (Left) as a surgical intervention .  The patient's history has been reviewed, patient examined, no change in status, stable for surgery.  I have reviewed the patient's chart and labs.  Questions were answered to the patient's satisfaction.     Rosario Jacks., Anne Hahn

## 2017-02-10 NOTE — Progress Notes (Signed)
1700 received pt from PACU. A&O x4, voice is hoarse, anterior neck incision with skin adhesive dry and intact. Medicated for pain.

## 2017-02-10 NOTE — Transfer of Care (Signed)
Immediate Anesthesia Transfer of Care Note  Patient: Hailey Robinson  Procedure(s) Performed: Procedure(s): LEFT THYROID LOBECTOMY (Left)  Patient Location: PACU  Anesthesia Type:General  Level of Consciousness: awake, oriented and patient cooperative  Airway & Oxygen Therapy: Patient Spontanous Breathing and Patient connected to nasal cannula oxygen  Post-op Assessment: Report given to RN, Post -op Vital signs reviewed and stable and Patient moving all extremities  Post vital signs: Reviewed and stable  Last Vitals:  Vitals:   02/10/17 1205  BP: 126/83  Pulse: 79  Resp: 18  Temp: 36.8 C    Last Pain:  Vitals:   02/10/17 1205  TempSrc: Oral         Complications: No apparent anesthesia complications

## 2017-02-10 NOTE — Anesthesia Procedure Notes (Signed)
Procedure Name: Intubation Date/Time: 02/10/2017 1:08 PM Performed by: Melina Copa, Gerald Kuehl R Pre-anesthesia Checklist: Patient identified, Emergency Drugs available, Suction available and Patient being monitored Patient Re-evaluated:Patient Re-evaluated prior to inductionOxygen Delivery Method: Circle System Utilized Preoxygenation: Pre-oxygenation with 100% oxygen Intubation Type: IV induction Ventilation: Mask ventilation without difficulty Laryngoscope Size: Mac and 3 Grade View: Grade II Tube type: Oral Tube size: 7.5 mm Number of attempts: 1 Airway Equipment and Method: Stylet and Oral airway Placement Confirmation: ETT inserted through vocal cords under direct vision,  positive ETCO2 and breath sounds checked- equal and bilateral Tube secured with: Tape Dental Injury: Teeth and Oropharynx as per pre-operative assessment

## 2017-02-10 NOTE — Op Note (Signed)
02/10/2017  2:59 PM  PATIENT:  Hailey Robinson  56 y.o. female  PRE-OPERATIVE DIAGNOSIS:  Left thyroid nodule  POST-OPERATIVE DIAGNOSIS:  Left thyroid nodule  PROCEDURE:  Procedure(s): LEFT THYROID LOBECTOMY (Left)  SURGEON:  Surgeon(s) and Role:    * Ralene Ok, MD - Primary  ANESTHESIA:   local and general  EBL:  Total I/O In: -  Out: 24 [Blood:75]  BLOOD ADMINISTERED:none  DRAINS: none   LOCAL MEDICATIONS USED:  BUPIVICAINE   SPECIMEN:  Source of Specimen:  L thyroid lobe, stitch marks the superior lobe  DISPOSITION OF SPECIMEN:  PATHOLOGY  COUNTS:  YES  TOURNIQUET:  * No tourniquets in log *  DICTATION: .Dragon Dictation Complications:  none   Counts: reported as correct x 2   Findings: Patient had a large, sub-clavicular L thyroid lobe.    Indications for procedure: The patient is a 56 y/o F who had a left thyroid nodule, patient also had dysphagia. She was counseled in clinic and decided to have the left thyroid lobe resected.  Details of the procedure:  The patient was taken back to the operating room. The patient was placed in supine position with bilateral SCDs in place.  The patient was prepped and draped in the usual sterile fashion. After appropriate anitbiotics were confirmed, a time-out was confirmed and all facts were verified.   A 4 cm incision was made approximately 2 fingerbreadths above the sternal notch. Bovie cautery was used to maintain hemostasis dissection was carried down through the platysma. The platysma was elevated and flaps were created superiorly and inferiorly to the thyroid cartilage as well as the sternal notch, repsectively. The strap muscles were identified in the midline and separated. Left-sided strap muscles were elevated off the anterior surface of the thyroid. We proceeded to dissect away the superior lobe and Kitners were used to gently dissect the surrounding musculature from the thyroid. The superior thyroid vessels  were identified  They were very large and doubly ligated with a 0 silk and large clip and transected with a Harmonic scalpel. This dissection was carried laterally. The middle thyroid vein was identified and doubly ligated. At this time this freed up the superior lobe was able to deliver this into the wound. We were not able to identify the superior parathyroid gland  We continued to dissect the thyroid off of the trachea from lateral to medial direction. The left recurrent laryngeal nerve was not identified, however dissection was kept right on the thyroid lobe.  I proceed to dissect the left inferior lobe.  This went down inferior to the left clavicle.  With blunt manipulation, the left inferior thyroid lobe was brought up into the wound. The inferior thyroid vessels were identified ligated with clips. At this time Berry's ligament was dissected away from the trachea. The isthmus lobe was left in place.  This delivered the left lobe of the thyroid into the wound and the harmonic scalpel was used to divide the thyroid in the midline. A superior stitch was then placed in the superior thyroid lobe.   The area was irrigated out. There was a deep pocket sub-clavicular.  There was some ooziness coming from some small anterior veins.  These were clipped and cauterize.  The pocket was hemostatic at the close of the anterior strap muscles.  The dissection bed was hemostatic. We placed fibrillar hemostatic agent into the wound. Strap muscles were then reapproximated in the midline with interrupted 3-0 Vicryl stitches. The platysma was reapproximated using 3-0  Vicryl stitches in interrupted fashion. Skin was then reapproximated using a running subcuticular 4-0 Monocryl. The skin was then dressed with Dermabond. The patient was taken to the recovery room in stable condition.   PLAN OF CARE: Admit for overnight observation  PATIENT DISPOSITION:  PACU - hemodynamically stable.   Delay start of Pharmacological VTE  agent (>24hrs) due to surgical blood loss or risk of bleeding: not applicable

## 2017-02-10 NOTE — Anesthesia Preprocedure Evaluation (Signed)
Anesthesia Evaluation  Patient identified by MRN, date of birth, ID band Patient awake    Reviewed: Allergy & Precautions, NPO status , Patient's Chart, lab work & pertinent test results  Airway Mallampati: I  TM Distance: >3 FB Neck ROM: Full    Dental  (+) Teeth Intact, Dental Advisory Given   Pulmonary Current Smoker,           Cardiovascular hypertension, Pt. on medications      Neuro/Psych    GI/Hepatic   Endo/Other    Renal/GU      Musculoskeletal   Abdominal   Peds  Hematology   Anesthesia Other Findings   Reproductive/Obstetrics                             Anesthesia Physical Anesthesia Plan  ASA: II  Anesthesia Plan: General   Post-op Pain Management:    Induction: Intravenous  Airway Management Planned: Oral ETT  Additional Equipment: None  Intra-op Plan:   Post-operative Plan: Extubation in OR  Informed Consent: I have reviewed the patients History and Physical, chart, labs and discussed the procedure including the risks, benefits and alternatives for the proposed anesthesia with the patient or authorized representative who has indicated his/her understanding and acceptance.   Dental advisory given  Plan Discussed with: CRNA, Anesthesiologist and Surgeon  Anesthesia Plan Comments:         Anesthesia Quick Evaluation

## 2017-02-10 NOTE — Anesthesia Preprocedure Evaluation (Addendum)
Anesthesia Evaluation  Patient identified by MRN, date of birth, ID band Patient awake    Reviewed: Allergy & Precautions, NPO status , Patient's Chart, lab work & pertinent test results  Airway Mallampati: I       Dental no notable dental hx.    Pulmonary Current Smoker,    Pulmonary exam normal        Cardiovascular hypertension, Pt. on medications Normal cardiovascular exam + Systolic murmurs    Neuro/Psych negative neurological ROS     GI/Hepatic Neg liver ROS,   Endo/Other    Renal/GU negative Renal ROS     Musculoskeletal negative musculoskeletal ROS (+)   Abdominal Normal abdominal exam  (+)   Peds  Hematology   Anesthesia Other Findings   Reproductive/Obstetrics                             Anesthesia Physical Anesthesia Plan  ASA: II  Anesthesia Plan: General   Post-op Pain Management:    Induction: Intravenous  Airway Management Planned: Oral ETT  Additional Equipment:   Intra-op Plan:   Post-operative Plan: Extubation in OR  Informed Consent: I have reviewed the patients History and Physical, chart, labs and discussed the procedure including the risks, benefits and alternatives for the proposed anesthesia with the patient or authorized representative who has indicated his/her understanding and acceptance.   Dental advisory given  Plan Discussed with: CRNA and Surgeon  Anesthesia Plan Comments:         Anesthesia Quick Evaluation

## 2017-02-11 ENCOUNTER — Encounter (HOSPITAL_COMMUNITY): Payer: Self-pay | Admitting: General Surgery

## 2017-02-11 DIAGNOSIS — Z79899 Other long term (current) drug therapy: Secondary | ICD-10-CM | POA: Diagnosis not present

## 2017-02-11 DIAGNOSIS — F17200 Nicotine dependence, unspecified, uncomplicated: Secondary | ICD-10-CM | POA: Diagnosis not present

## 2017-02-11 DIAGNOSIS — J302 Other seasonal allergic rhinitis: Secondary | ICD-10-CM | POA: Diagnosis not present

## 2017-02-11 DIAGNOSIS — I10 Essential (primary) hypertension: Secondary | ICD-10-CM | POA: Diagnosis not present

## 2017-02-11 DIAGNOSIS — D34 Benign neoplasm of thyroid gland: Secondary | ICD-10-CM | POA: Diagnosis not present

## 2017-02-11 MED ORDER — HYDROCODONE-ACETAMINOPHEN 5-325 MG PO TABS
1.0000 | ORAL_TABLET | ORAL | Status: DC | PRN
  Administered 2017-02-11: 1 via ORAL
  Filled 2017-02-11: qty 1

## 2017-02-11 MED ORDER — HYDROCODONE-ACETAMINOPHEN 5-325 MG PO TABS: 1.0000 | ORAL_TABLET | ORAL | 0 refills | Status: DC | PRN

## 2017-02-11 MED ORDER — DIPHENHYDRAMINE HCL 25 MG PO CAPS
25.0000 mg | ORAL_CAPSULE | Freq: Once | ORAL | Status: AC
  Administered 2017-02-11: 25 mg via ORAL
  Filled 2017-02-11: qty 1

## 2017-02-11 NOTE — Discharge Summary (Signed)
Physician Discharge Summary  Patient ID: Hailey Robinson MRN: [id number] DOB/AGE: 06-14-1961 56 y.o.  Admit date: 02/10/2017 Discharge date: 02/11/2017  Admission Diagnoses:s/p L thyroidecotmy  Discharge Diagnoses:  Active Problems:   S/P thyroidectomy   Discharged Condition: good  Hospital Course: Pt was admitted post op to the floor.  She was started on a CLD and adv to a soft diet which she was able to tolerate.  She had fair pain control, but no incisional pain.  She was ambulating well on her own.  She had no signs of hemorrhage on DC.  She was deemed stable for DC and Dc'd home.  Consults: None  Significant Diagnostic Studies: none  Treatments: surgery: as above  Discharge Exam: Blood pressure (!) 129/55, pulse 63, temperature 97.8 F (36.6 C), temperature source Oral, resp. rate 17, height 5\' 6"  (1.676 m), weight 85.3 kg (188 lb), last menstrual period 01/22/2017, SpO2 98 %. General appearance: alert and cooperative Incision/Wound: some edema, incision c/d/I   Disposition: Final discharge disposition not confirmed  Discharge Instructions    Call MD for:  difficulty breathing, headache or visual disturbances    Complete by:  As directed    Call MD for:  persistant nausea and vomiting    Complete by:  As directed    Call MD for:  temperature >100.4    Complete by:  As directed    Diet - low sodium heart healthy    Complete by:  As directed    Increase activity slowly    Complete by:  As directed      Allergies as of 02/11/2017      Reactions   No Known Allergies       Medication List    TAKE these medications   hydrochlorothiazide 25 MG tablet Commonly known as:  HYDRODIURIL Take 1 tablet (25 mg total) by mouth daily. What changed:  when to take this   HYDROcodone-acetaminophen 5-325 MG tablet Commonly known as:  NORCO/VICODIN Take 1-2 tablets by mouth every 4 (four) hours as needed for severe pain.   ibuprofen 200 MG tablet Commonly known as:   ADVIL,MOTRIN Take 600 mg by mouth every 8 (eight) hours as needed (for knee pain/cramps).   potassium chloride SA 20 MEQ tablet Commonly known as:  K-DUR,KLOR-CON Take 1 tablet (20 mEq total) by mouth daily. What changed:  when to take this   TGT ALLERGY RELIEF NON-DROWSY PO Take 10 mg by mouth daily with lunch.   Vitamin D (Ergocalciferol) 50000 units Caps capsule Commonly known as:  DRISDOL Take 1 capsule (50,000 Units total) by mouth every 7 (seven) days. Take for 8 total doses(weeks)   vitamin E 200 UNIT capsule Take 200 Units by mouth daily with lunch.      Follow-up Information    Reyes Ivan, MD. Schedule an appointment as soon as possible for a visit in 2 weeks.   Specialty:  General Surgery Contact information: [place] [address] [phone]           Signed: Rosario Jacks., Anne Hahn 02/11/2017, 8:01 AM

## 2017-02-11 NOTE — Anesthesia Postprocedure Evaluation (Signed)
Anesthesia Post Note  Patient: Hailey Robinson  Procedure(s) Performed: Procedure(s) (LRB): LEFT THYROID LOBECTOMY (Left)  Anesthesia Type: General       Last Vitals:  Vitals:   02/10/17 2125 02/11/17 0532  BP: 129/85 (!) 129/55  Pulse: 66 63  Resp: 16 17  Temp: 36.4 C 36.6 C    Last Pain:  Vitals:   02/11/17 0532  TempSrc: Oral  PainSc:                  Ladamien Rammel S

## 2017-02-11 NOTE — Progress Notes (Signed)
Pt is anxious to go home, voice is still hoarse. Pain meds given for throat pain with relief. Discharge instructions given to pt, verbalized understanding. Discharged to home accompanied by spouse.

## 2017-02-24 DIAGNOSIS — E041 Nontoxic single thyroid nodule: Secondary | ICD-10-CM | POA: Diagnosis not present

## 2017-03-11 ENCOUNTER — Other Ambulatory Visit: Payer: Self-pay

## 2017-03-11 ENCOUNTER — Other Ambulatory Visit: Payer: Self-pay | Admitting: Physician Assistant

## 2017-03-11 DIAGNOSIS — E876 Hypokalemia: Secondary | ICD-10-CM

## 2017-03-11 DIAGNOSIS — T502X5A Adverse effect of carbonic-anhydrase inhibitors, benzothiadiazides and other diuretics, initial encounter: Principal | ICD-10-CM

## 2017-03-11 MED ORDER — POTASSIUM CHLORIDE CRYS ER 20 MEQ PO TBCR: 20.0000 meq | EXTENDED_RELEASE_TABLET | Freq: Every day | ORAL | 3 refills | Status: DC

## 2017-04-09 DIAGNOSIS — Z1211 Encounter for screening for malignant neoplasm of colon: Secondary | ICD-10-CM | POA: Diagnosis not present

## 2017-04-13 ENCOUNTER — Encounter: Payer: Self-pay | Admitting: Physician Assistant

## 2017-04-13 DIAGNOSIS — K640 First degree hemorrhoids: Secondary | ICD-10-CM | POA: Insufficient documentation

## 2017-04-13 HISTORY — DX: First degree hemorrhoids: K64.0

## 2017-04-26 NOTE — Anesthesia Postprocedure Evaluation (Signed)
Anesthesia Post Note  Patient: Hailey Robinson  Procedure(s) Performed: Procedure(s) (LRB): LEFT THYROID LOBECTOMY (Left)     Anesthesia Post Evaluation  Last Vitals:  Vitals:   02/10/17 2125 02/11/17 0532  BP: 129/85 (!) 129/55  Pulse: 66 63  Resp: 16 17  Temp: 36.4 C 36.6 C    Last Pain:  Vitals:   02/11/17 0532  TempSrc: Oral  PainSc:                  Reola Buckles S

## 2017-04-26 NOTE — Addendum Note (Signed)
Addendum  created 04/26/17 1231 by Myrtie Soman, MD   Sign clinical note

## 2017-04-27 ENCOUNTER — Other Ambulatory Visit: Payer: Self-pay | Admitting: Physician Assistant

## 2017-04-27 DIAGNOSIS — I10 Essential (primary) hypertension: Secondary | ICD-10-CM

## 2017-06-17 ENCOUNTER — Ambulatory Visit (INDEPENDENT_AMBULATORY_CARE_PROVIDER_SITE_OTHER): Payer: Federal, State, Local not specified - PPO | Admitting: Internal Medicine

## 2017-06-17 ENCOUNTER — Encounter: Payer: Self-pay | Admitting: Internal Medicine

## 2017-06-17 VITALS — BP 124/82 | HR 82 | Ht 66.0 in | Wt 190.0 lb

## 2017-06-17 DIAGNOSIS — E890 Postprocedural hypothyroidism: Secondary | ICD-10-CM | POA: Diagnosis not present

## 2017-06-17 DIAGNOSIS — Z9889 Other specified postprocedural states: Secondary | ICD-10-CM

## 2017-06-17 DIAGNOSIS — H0520 Unspecified exophthalmos: Secondary | ICD-10-CM

## 2017-06-17 DIAGNOSIS — Z8639 Personal history of other endocrine, nutritional and metabolic disease: Secondary | ICD-10-CM | POA: Diagnosis not present

## 2017-06-17 LAB — TSH: TSH: 1.53 u[IU]/mL (ref 0.35–4.50)

## 2017-06-17 LAB — T3, FREE: T3, Free: 3.6 pg/mL (ref 2.3–4.2)

## 2017-06-17 LAB — T4, FREE: FREE T4: 0.83 ng/dL (ref 0.60–1.60)

## 2017-06-17 NOTE — Progress Notes (Signed)
Patient ID: Hailey Robinson, female   DOB: November 02, 1961, 56 y.o.   MRN: [id number]    HPI  Hailey Robinson is a 56 y.o.-year-old female, returning for f/u for for goiter, low TSH and a thyroid nodule >> now s/p L lobectomy.  Reviewed and addended hx: She had the flu  In 10/2016 >> went to UC >> CXR (11/02/2016) showed PNA + incidental thyroid nodule: IMPRESSION: 1. Patchy right perihilar infiltrate. 2. No significant change rightward displacement of trachea due to complex enlarged left thyroid lobe on 07/05/2015 cervical spine CT. Thyroid ultrasound would be helpful in further evaluation.  Thyroid U/S (11/19/2016): Left lobe enlarged, 10.1 x 4 x 3.7 cm, containing a isoechoic solid 3.2 x 2.2 x 6.0 cm nodule  The nodule was attempted to be biopsied on 12/08/2016 but the sample was insufficient.  She had a repeat attempted biopsy on 12/17/2016. Results: benign.  She had L lobectomy 02/10/2017 (Dr. Rosendo Gros) as he had neck compression sxs..  Pt denies: - feeling nodules in neck - dysphagia - choking - SOB with lying down  She has hoarseness >> a little better. No pain or dysesthesia at the scar site. She also feels that her eyes are bulging more than normal.  I reviewed pt's thyroid tests: Lab Results  Component Value Date   TSH 0.82 12/18/2016   TSH 0.22 (L) 11/04/2016   FREET4 0.79 12/18/2016   FREET4 1.2 11/04/2016    Component     Latest Ref Rng & Units 11/04/2016  Thyroperoxidase Ab SerPl-aCnc     <9 IU/mL 1  Thyroglobulin Ab     <2 IU/mL <1  Thyroglobulin     ng/mL >450000.0 (H)  TSI     <140 % baseline 338 (H)   + FH of thyroid ds. In sister - hyperthyroidism - had thyroidectomy. No FH of thyroid cancer. No h/o radiation tx to head or neck.  No seaweed or kelp. No recent contrast studies. No herbal supplements. No Biotin use. No recent steroids use.   Pt also has a history of HTN.  ROS: Constitutional: + weight gain,+ fatigue, + subjective hyperthermia, no  subjective hypothermia, + insomnia Eyes: no blurry vision, no xerophthalmia ENT: no sore throat, no nodules palpated in throat, no dysphagia, no odynophagia, no hoarseness Cardiovascular: no CP/no SOB/no palpitations/no leg swelling Respiratory: no cough/no SOB/no wheezing Gastrointestinal: no N/no V/no D/no C/+ acid reflux Musculoskeletal: no muscle aches/no joint aches Skin: no rashes, + hair loss Neurological: no tremors/no numbness/no tingling/no dizziness  I reviewed pt's medications, allergies, PMH, social hx, family hx, and changes were documented in the history of present illness. Otherwise, unchanged from my initial visit note.  Past Medical History:  Diagnosis Date  . Anemia   . Arthritis    "think I have some in my lower back" (02/10/2017)  . Functional systolic murmur    normal ECHO 07/2016  . GERD (gastroesophageal reflux disease)    spicy foods  . Hypertension   . Left thyroid nodule 11/04/2016  . Pneumonia 10/2016  . Seasonal allergies    Past Surgical History:  Procedure Laterality Date  . BIOPSY THYROID  12/2016 X 2  . LACERATION REPAIR Right 1985   pinky  . THYROID LOBECTOMY Left 02/10/2017  . THYROID LOBECTOMY Left 02/10/2017   Procedure: LEFT THYROID LOBECTOMY;  Surgeon: Ralene Ok, MD;  Location: [facility];  Service: General;  Laterality: Left;  . TUBAL LIGATION     Social History   Social History  . Marital  status: Married    Spouse name: N/A  . Number of children: 3   Occupational History  .  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 Smoking status: Current Every Day Smoker    Packs/day: 0.50    Years: 10.00  . Smokeless tobacco: Never Used  . Alcohol use 1.2 oz/week    2 Cans of beer per week  . Drug use: No   Current Outpatient Prescriptions on File Prior to Visit  Medication Sig Dispense Refill  . hydrochlorothiazide (HYDRODIURIL) 25 MG tablet Take 1 tablet (25 mg total) by mouth daily. Due for follow up  visit 30 tablet 0  . HYDROcodone-acetaminophen (NORCO/VICODIN) 5-325 MG tablet Take 1-2 tablets by mouth every 4 (four) hours as needed for severe pain. 30 tablet 0  . ibuprofen (ADVIL,MOTRIN) 200 MG tablet Take 600 mg by mouth every 8 (eight) hours as needed (for knee pain/cramps).    . Loratadine (TGT ALLERGY RELIEF NON-DROWSY PO) Take 10 mg by mouth daily with lunch.    . potassium chloride SA (K-DUR,KLOR-CON) 20 MEQ tablet TAKE 1 TABLET BY MOUTH DAILY 90 tablet 0  . vitamin E 200 UNIT capsule Take 200 Units by mouth daily with lunch.     No current facility-administered medications on file prior to visit.    Allergies  Allergen Reactions  . No Known Allergies    Family History  Problem Relation Age of Onset  . Hyperlipidemia Mother   . Hypertension Mother   . Hypertension Father   . Hyperlipidemia Father   . Thyroid disease Sister    PE: BP 124/82 (BP Location: Left Arm, Patient Position: Sitting)   Pulse 82   Ht 5\' 6"  (1.676 m)   Wt 190 lb (86.2 kg)   LMP 02/15/2017   SpO2 96%   BMI 30.67 kg/m  Wt Readings from Last 3 Encounters:  06/17/17 190 lb (86.2 kg)  02/10/17 188 lb (85.3 kg)  02/02/17 188 lb 14.4 oz (85.7 kg)   Constitutional: overweight, in NAD Eyes: PERRLA, EOMI, + exophthalmos - 18 mm bilaterally at 113  ENT: moist mucous membranes, Thyroidectomy scar with pigmentation, but otherwise no significant keloid or inflammation, no cervical lymphadenopathy Cardiovascular: RRR, No MRG Respiratory: CTA B Gastrointestinal: abdomen soft, NT, ND, BS+ Musculoskeletal: no deformities, strength intact in all 4 Skin: moist, warm, no rashes Neurological: no tremor with outstretched hands, DTR normal in all 4   ASSESSMENT: 1. L large Thyroid nodule  2. Thyrotoxicosis/hyperthyroidism  - with elevated TSI antibodies   PLAN: 1.  H/o L large thyroid nodule - Patient has a history of left lobectomy for compressive left thyroid nodule. She is feeling well after the  surgery, however, she has hoarseness even 55 months out from the procedure and we discussed about the possibility of voice rehabilitation. She will discuss with Dr. Rosendo Gros. Otherwise, her thyroidectomy scar appears healed; she still has some itching at the site.  2. Thyrotoxicosis - Patient had transiently low TSH, and 0.22, with normal free T4. TSI antibodies were slightly elevated while the TPO and ATA antibodies were not. We discussed at that time and again today that she may have had an episode of thyroiditis or mild Graves' disease. However, she now describes exophthalmos, which is visible on exam and feels that this is worse. This is more consistent with Graves' disease. I will recheck her antibodies today, but I expect these to be lower due to her previous surgery.  -  today, will repeat a TSH, free T4, free T3, and TSI   3. Exophthalmos  - I can see this clinically, but eyes are protruding at 18 mm by exophthalmometry which is usually normal, but it may have been an increase from her previous values. She does not have any vision changes, so for now, I will check her TSI's but I did not suggest that she sees an eye doctor quite yet.   Philemon Kingdom, MD PhD [facility]

## 2017-06-17 NOTE — Patient Instructions (Signed)
Please stop at the lab.  If we need to start Levothyroxine, take the thyroid hormone every day, with water, at least 30 minutes before breakfast, separated by at least 4 hours from: - acid reflux medications - calcium - iron - multivitamins  Please come back for a follow-up appointment in 4 months.  

## 2017-06-23 ENCOUNTER — Telehealth: Payer: Self-pay

## 2017-06-23 ENCOUNTER — Other Ambulatory Visit: Payer: Self-pay

## 2017-06-23 LAB — THYROID STIMULATING IMMUNOGLOBULIN: TSI: 277 % baseline — ABNORMAL HIGH (ref <140)

## 2017-06-23 NOTE — Telephone Encounter (Signed)
-----   Message from Philemon Kingdom, MD sent at 06/23/2017 12:31 PM EDT ----- Hailey Robinson, can you please call pt: Labs are normal but TSI Abs are not back yet >> Hailey Robinson can you let pt know but also check with Kieth Brightly to see why they take so long?

## 2017-06-23 NOTE — Telephone Encounter (Signed)
Spoke with [place] regarding the lab, she spoke with the lab. They stated it takes 5 days, we should receive it August 5th.

## 2017-06-23 NOTE — Telephone Encounter (Signed)
Called patient and gave lab results. Patient had no questions or concerns.  

## 2017-06-24 ENCOUNTER — Telehealth: Payer: Self-pay

## 2017-06-24 NOTE — Telephone Encounter (Signed)
-----   Message from Philemon Kingdom, MD sent at 06/24/2017  8:07 AM EDT ----- Almyra Free, can you please call pt:  Graves Ab's are still high, but no intervention is needed for now as the rest of her labs are normal.

## 2017-06-24 NOTE — Telephone Encounter (Signed)
Called and gave lab results.

## 2017-06-24 NOTE — Telephone Encounter (Signed)
Got it today! Thank you!

## 2017-06-24 NOTE — Telephone Encounter (Signed)
Called patient and gave lab results. Patient had no questions or concerns.  

## 2017-07-07 ENCOUNTER — Other Ambulatory Visit: Payer: Self-pay | Admitting: Physician Assistant

## 2017-07-07 DIAGNOSIS — I10 Essential (primary) hypertension: Secondary | ICD-10-CM

## 2017-08-14 ENCOUNTER — Other Ambulatory Visit: Payer: Self-pay | Admitting: Physician Assistant

## 2017-08-14 DIAGNOSIS — I10 Essential (primary) hypertension: Secondary | ICD-10-CM

## 2017-08-20 ENCOUNTER — Other Ambulatory Visit: Payer: Self-pay | Admitting: Physician Assistant

## 2017-08-20 DIAGNOSIS — I10 Essential (primary) hypertension: Secondary | ICD-10-CM

## 2017-08-21 ENCOUNTER — Other Ambulatory Visit: Payer: Self-pay | Admitting: Sports Medicine

## 2017-08-21 DIAGNOSIS — I10 Essential (primary) hypertension: Secondary | ICD-10-CM

## 2017-08-23 ENCOUNTER — Other Ambulatory Visit: Payer: Self-pay | Admitting: Physician Assistant

## 2017-08-23 DIAGNOSIS — I10 Essential (primary) hypertension: Secondary | ICD-10-CM

## 2017-09-21 DIAGNOSIS — M25461 Effusion, right knee: Secondary | ICD-10-CM | POA: Diagnosis not present

## 2017-09-21 DIAGNOSIS — W1843XA Slipping, tripping and stumbling without falling due to stepping from one level to another, initial encounter: Secondary | ICD-10-CM | POA: Diagnosis not present

## 2017-09-21 DIAGNOSIS — M25561 Pain in right knee: Secondary | ICD-10-CM | POA: Diagnosis not present

## 2017-09-21 DIAGNOSIS — Z885 Allergy status to narcotic agent status: Secondary | ICD-10-CM | POA: Diagnosis not present

## 2017-09-21 DIAGNOSIS — S8391XA Sprain of unspecified site of right knee, initial encounter: Secondary | ICD-10-CM | POA: Diagnosis not present

## 2017-09-21 DIAGNOSIS — Z87891 Personal history of nicotine dependence: Secondary | ICD-10-CM | POA: Diagnosis not present

## 2017-09-21 DIAGNOSIS — S39012A Strain of muscle, fascia and tendon of lower back, initial encounter: Secondary | ICD-10-CM | POA: Diagnosis not present

## 2017-09-21 DIAGNOSIS — M545 Low back pain: Secondary | ICD-10-CM | POA: Diagnosis not present

## 2017-09-24 ENCOUNTER — Other Ambulatory Visit: Payer: Self-pay | Admitting: Physician Assistant

## 2017-09-24 DIAGNOSIS — I10 Essential (primary) hypertension: Secondary | ICD-10-CM

## 2017-09-24 DIAGNOSIS — R499 Unspecified voice and resonance disorder: Secondary | ICD-10-CM | POA: Diagnosis not present

## 2017-09-27 IMAGING — US US SOFT TISSUE HEAD/NECK
1 series · 13 of 25 positions shown · non-contrast
Comparison: None.

CLINICAL DATA: Palpable abnormality. Thyroid enlargement on
physical examination.

EXAM:
THYROID ULTRASOUND
TECHNIQUE: Ultrasound examination of the thyroid gland and adjacent soft
tissues was performed.

[Series 1: us soft tissue head/neck · 0.07mm/px · 13 of 54 slices shown]
[im 1/54]
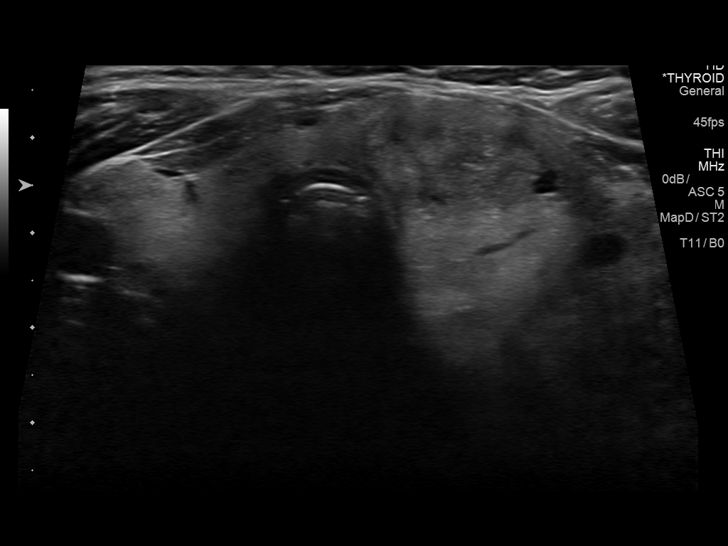
[im 5/54]
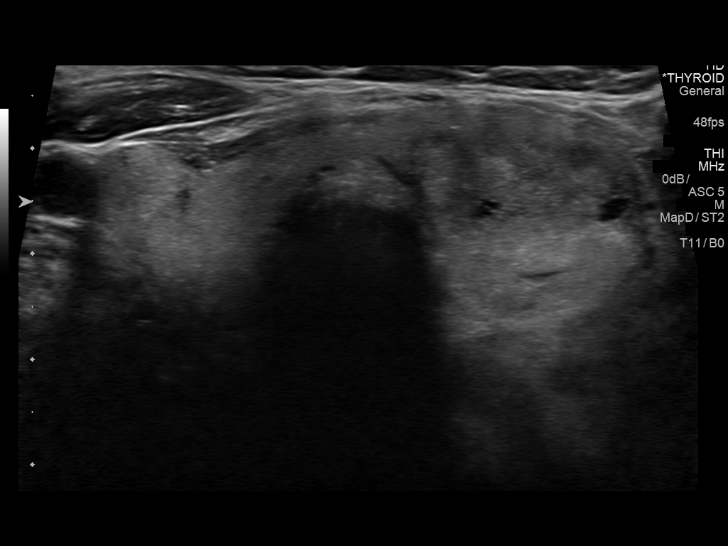
[im 9/54]
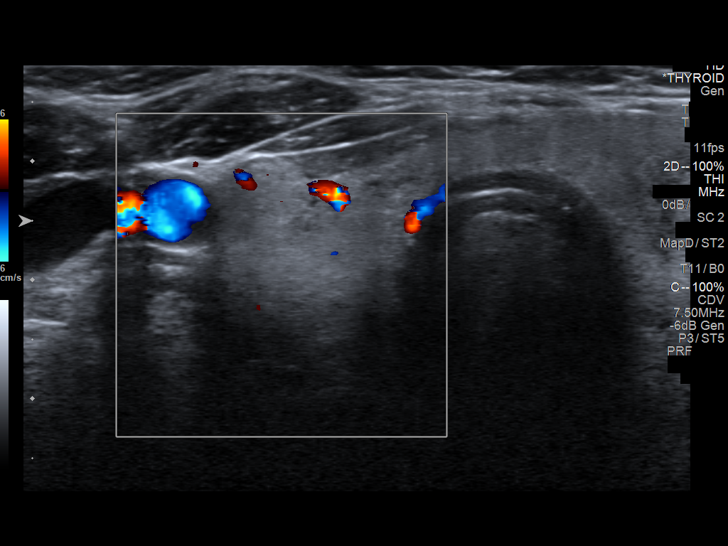
[im 14/54]
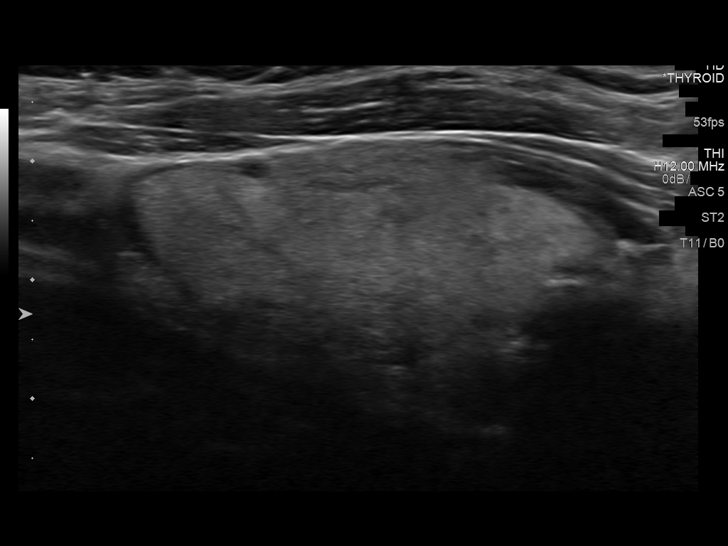
[im 18/54]
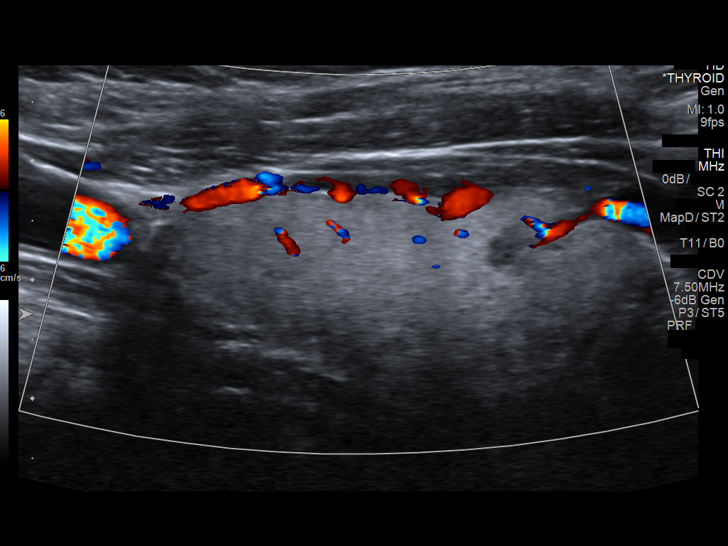
[im 23/54]
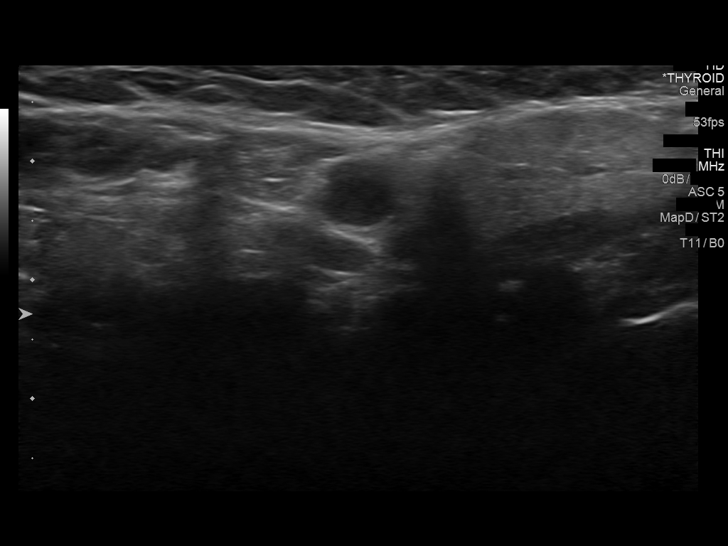
[im 27/54]
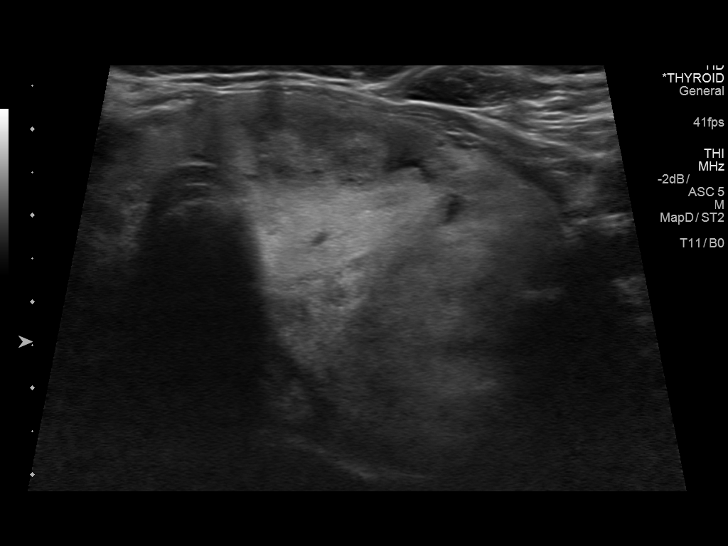
[im 31/54]
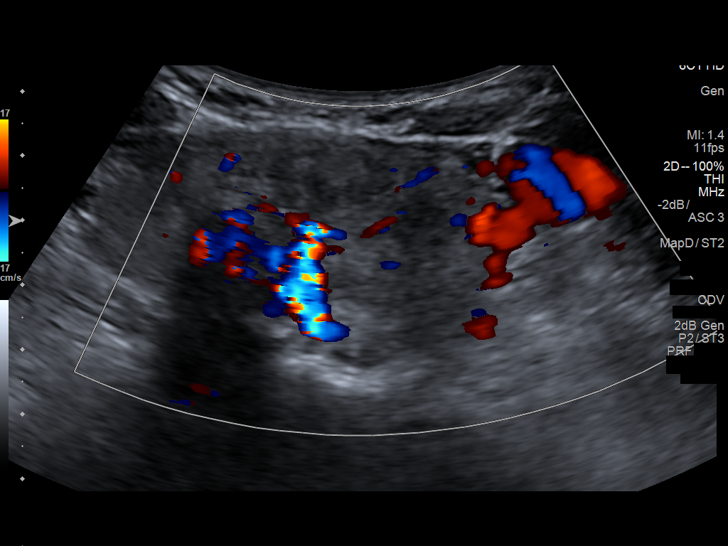
[im 36/54]
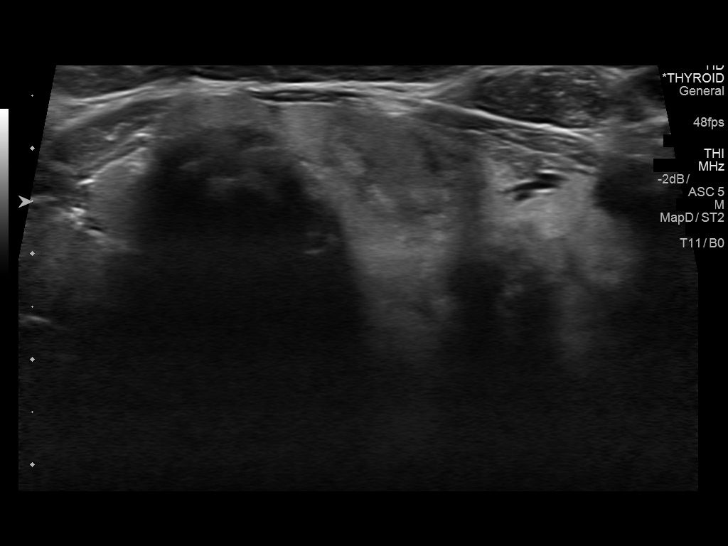
[im 40/54]
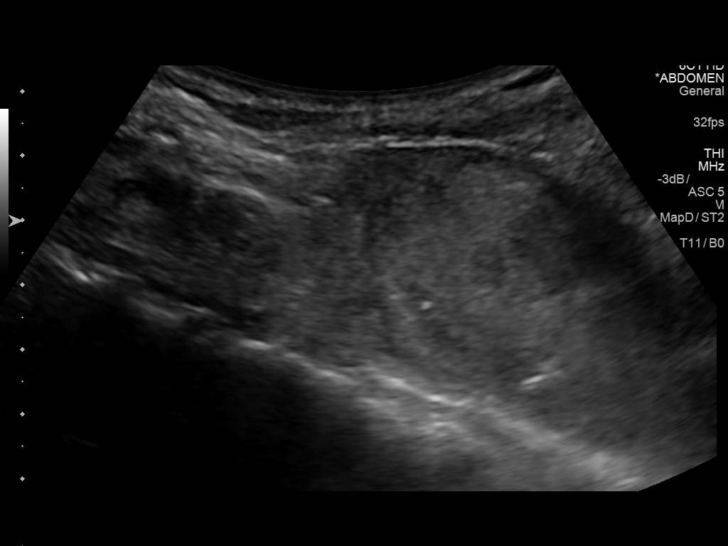
[im 45/54]
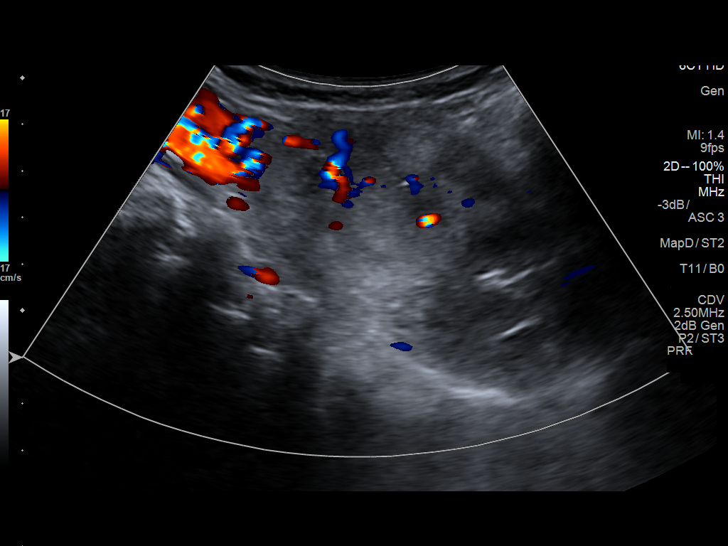
[im 49/54]
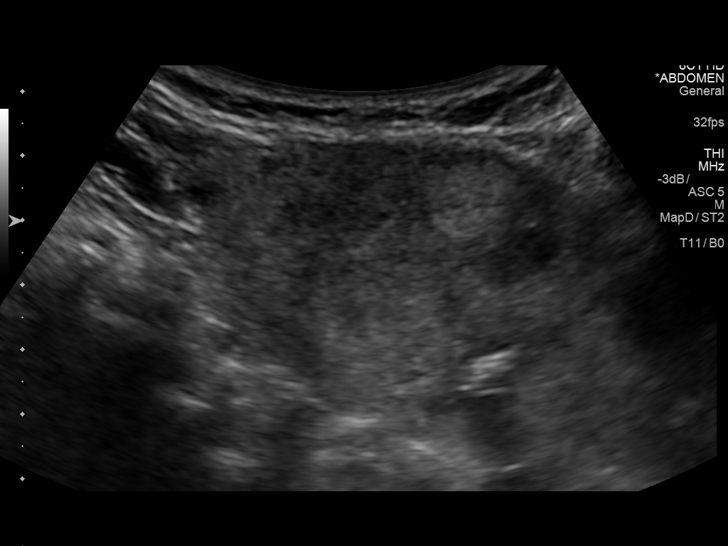
[im 54/54]
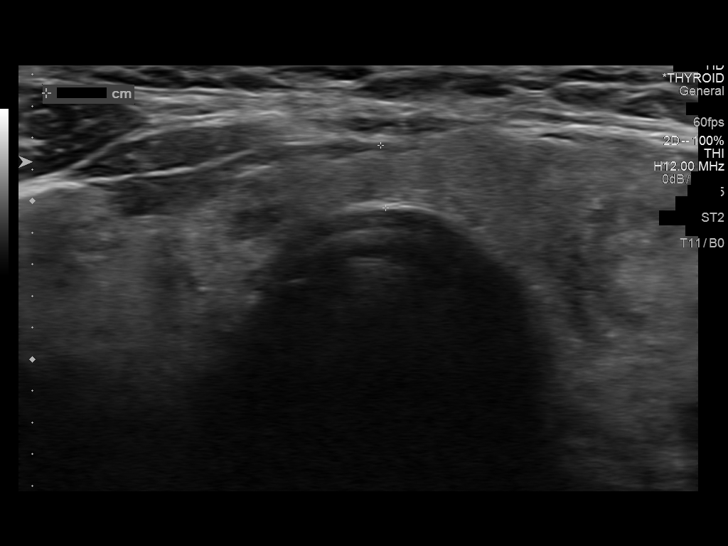

[13 of 25 positions shown; findings below may reference images not displayed]

FINDINGS: Parenchymal Echotexture: Mildly heterogenous

Estimated total number of nodules >/= 1 cm: 1

Number of spongiform nodules >/=  2 cm not described below (TR1): 0

Number of mixed cystic and solid nodules >/= 1.5 cm not described
below (TR2): 0

_________________________________________________________

Isthmus: Normal in size measuring 0.4 cm in diameter

No discrete nodules are identified within the thyroid isthmus.

_________________________________________________________

Right lobe: Borderline enlarged measuring 5.1 x 2.0 x 1.8 cm

No discrete nodules are identified within the right lobe of the
thyroid.

_________________________________________________________

Left lobe: Enlarged measuring 10.1 x 4.0 x 3.7 cm

Nodule # 1:

Location: Left; Inferior

Size: 3.2 x 2.2 x 6.0 cm

Composition: solid/almost completely solid (2)

Echogenicity: isoechoic (1)

Shape: not taller-than-wide (0)

Margins: ill-defined (0)

Echogenic foci: none (0)

ACR TI-RADS total points: 3.

ACR TI-RADS risk category: TR3 (3 points).

ACR TI-RADS recommendations:

**Given size (>/= 2.5 cm) and appearance, fine needle aspiration of
this mildly suspicious nodule should be considered based on TI-RADS
criteria.
IMPRESSION: Asymmetric enlargement of the left lobe of the thyroid secondary to
an apparent 6 cm nodule/pseudo nodule within the posterior inferior
aspect of the left lobe of the thyroid. This nodule/pseudo nodule
meets imaging criteria to recommend percutaneous sampling as
clinically indicated.

The above is in keeping with the ACR TI-RADS recommendations - [HOSPITAL] 1162;[DATE].

## 2017-10-01 DIAGNOSIS — Z886 Allergy status to analgesic agent status: Secondary | ICD-10-CM | POA: Diagnosis not present

## 2017-10-01 DIAGNOSIS — K029 Dental caries, unspecified: Secondary | ICD-10-CM | POA: Diagnosis not present

## 2017-10-01 DIAGNOSIS — K0889 Other specified disorders of teeth and supporting structures: Secondary | ICD-10-CM | POA: Diagnosis not present

## 2017-10-01 DIAGNOSIS — K047 Periapical abscess without sinus: Secondary | ICD-10-CM | POA: Diagnosis not present

## 2017-10-01 DIAGNOSIS — K089 Disorder of teeth and supporting structures, unspecified: Secondary | ICD-10-CM | POA: Diagnosis not present

## 2017-10-01 DIAGNOSIS — R220 Localized swelling, mass and lump, head: Secondary | ICD-10-CM | POA: Diagnosis not present

## 2017-10-01 DIAGNOSIS — F17210 Nicotine dependence, cigarettes, uncomplicated: Secondary | ICD-10-CM | POA: Diagnosis not present

## 2017-10-18 ENCOUNTER — Ambulatory Visit: Payer: Federal, State, Local not specified - PPO | Admitting: Internal Medicine

## 2017-11-19 ENCOUNTER — Other Ambulatory Visit: Payer: Self-pay | Admitting: Physician Assistant

## 2017-11-19 DIAGNOSIS — I10 Essential (primary) hypertension: Secondary | ICD-10-CM

## 2017-11-29 DIAGNOSIS — F17210 Nicotine dependence, cigarettes, uncomplicated: Secondary | ICD-10-CM | POA: Diagnosis not present

## 2017-11-29 DIAGNOSIS — J3801 Paralysis of vocal cords and larynx, unilateral: Secondary | ICD-10-CM | POA: Diagnosis not present

## 2017-11-29 DIAGNOSIS — R490 Dysphonia: Secondary | ICD-10-CM | POA: Diagnosis not present

## 2017-12-07 DIAGNOSIS — J3801 Paralysis of vocal cords and larynx, unilateral: Secondary | ICD-10-CM | POA: Insufficient documentation

## 2017-12-19 ENCOUNTER — Other Ambulatory Visit: Payer: Self-pay | Admitting: Physician Assistant

## 2017-12-19 DIAGNOSIS — I10 Essential (primary) hypertension: Secondary | ICD-10-CM

## 2018-01-17 ENCOUNTER — Other Ambulatory Visit: Payer: Self-pay | Admitting: Sports Medicine

## 2018-01-17 DIAGNOSIS — I10 Essential (primary) hypertension: Secondary | ICD-10-CM

## 2018-01-25 ENCOUNTER — Other Ambulatory Visit: Payer: Self-pay | Admitting: Physician Assistant

## 2018-01-25 DIAGNOSIS — I10 Essential (primary) hypertension: Secondary | ICD-10-CM

## 2018-01-27 DIAGNOSIS — J3801 Paralysis of vocal cords and larynx, unilateral: Secondary | ICD-10-CM | POA: Diagnosis not present

## 2018-01-27 DIAGNOSIS — F17210 Nicotine dependence, cigarettes, uncomplicated: Secondary | ICD-10-CM | POA: Diagnosis not present

## 2018-01-27 DIAGNOSIS — R490 Dysphonia: Secondary | ICD-10-CM | POA: Diagnosis not present

## 2018-01-28 DIAGNOSIS — J3801 Paralysis of vocal cords and larynx, unilateral: Secondary | ICD-10-CM | POA: Diagnosis not present

## 2018-01-28 DIAGNOSIS — F17210 Nicotine dependence, cigarettes, uncomplicated: Secondary | ICD-10-CM | POA: Diagnosis not present

## 2018-02-22 ENCOUNTER — Telehealth: Payer: Self-pay

## 2018-02-22 ENCOUNTER — Other Ambulatory Visit: Payer: Self-pay | Admitting: Physician Assistant

## 2018-02-22 DIAGNOSIS — E876 Hypokalemia: Secondary | ICD-10-CM

## 2018-02-22 DIAGNOSIS — Z5181 Encounter for therapeutic drug level monitoring: Secondary | ICD-10-CM

## 2018-02-22 DIAGNOSIS — Z79899 Other long term (current) drug therapy: Principal | ICD-10-CM

## 2018-02-22 NOTE — Telephone Encounter (Signed)
I can't refill her BP med because her most recent blood work showed low potassium and hydrochlorothiazide can worsen this. If she goes to the lab today, I can send a refill as soon as I have her results. She will need an appointment with me at her earliest convenience for medication management.

## 2018-02-22 NOTE — Telephone Encounter (Signed)
Pt notified of recommendations and stated that she is in recover and will make an appointment at her earliest convenience. -EH/RMA

## 2018-02-22 NOTE — Telephone Encounter (Signed)
Pt called requesting a refill of BP meds.  She stated that she was in the hospital recovering from a procedure that she had done.  I explained to her that she hasn't been seen in over a year and that you were not listed as her PCP anymore.  She stated that she was seeing specialist for her thyroid that's why she hasn't been to our office.  I told pt that a message will be routed to provider and she will be contacted later today. -EH/RMA

## 2018-02-28 DIAGNOSIS — J384 Edema of larynx: Secondary | ICD-10-CM | POA: Diagnosis not present

## 2018-02-28 DIAGNOSIS — R490 Dysphonia: Secondary | ICD-10-CM | POA: Diagnosis not present

## 2018-02-28 DIAGNOSIS — J3801 Paralysis of vocal cords and larynx, unilateral: Secondary | ICD-10-CM | POA: Diagnosis not present

## 2018-03-23 DIAGNOSIS — Z79899 Other long term (current) drug therapy: Secondary | ICD-10-CM | POA: Diagnosis not present

## 2018-03-23 DIAGNOSIS — Z885 Allergy status to narcotic agent status: Secondary | ICD-10-CM | POA: Diagnosis not present

## 2018-03-23 DIAGNOSIS — K029 Dental caries, unspecified: Secondary | ICD-10-CM | POA: Diagnosis not present

## 2018-03-23 DIAGNOSIS — E041 Nontoxic single thyroid nodule: Secondary | ICD-10-CM | POA: Diagnosis not present

## 2018-03-23 DIAGNOSIS — K047 Periapical abscess without sinus: Secondary | ICD-10-CM | POA: Diagnosis not present

## 2018-03-23 DIAGNOSIS — K0889 Other specified disorders of teeth and supporting structures: Secondary | ICD-10-CM | POA: Diagnosis not present

## 2018-03-23 DIAGNOSIS — F17210 Nicotine dependence, cigarettes, uncomplicated: Secondary | ICD-10-CM | POA: Diagnosis not present

## 2018-03-23 DIAGNOSIS — R220 Localized swelling, mass and lump, head: Secondary | ICD-10-CM | POA: Diagnosis not present

## 2018-04-24 DIAGNOSIS — Z79899 Other long term (current) drug therapy: Secondary | ICD-10-CM | POA: Diagnosis not present

## 2018-04-24 DIAGNOSIS — J40 Bronchitis, not specified as acute or chronic: Secondary | ICD-10-CM | POA: Diagnosis not present

## 2018-04-24 DIAGNOSIS — Z885 Allergy status to narcotic agent status: Secondary | ICD-10-CM | POA: Diagnosis not present

## 2018-04-24 DIAGNOSIS — R0981 Nasal congestion: Secondary | ICD-10-CM | POA: Diagnosis not present

## 2018-04-24 DIAGNOSIS — R05 Cough: Secondary | ICD-10-CM | POA: Diagnosis not present

## 2018-04-24 DIAGNOSIS — F17210 Nicotine dependence, cigarettes, uncomplicated: Secondary | ICD-10-CM | POA: Diagnosis not present

## 2018-04-24 DIAGNOSIS — J209 Acute bronchitis, unspecified: Secondary | ICD-10-CM | POA: Diagnosis not present

## 2018-04-29 DIAGNOSIS — K080 Exfoliation of teeth due to systemic causes: Secondary | ICD-10-CM | POA: Diagnosis not present

## 2018-05-05 ENCOUNTER — Other Ambulatory Visit: Payer: Self-pay | Admitting: Physician Assistant

## 2018-05-05 ENCOUNTER — Ambulatory Visit (INDEPENDENT_AMBULATORY_CARE_PROVIDER_SITE_OTHER): Payer: Federal, State, Local not specified - PPO | Admitting: Physician Assistant

## 2018-05-05 ENCOUNTER — Encounter: Payer: Self-pay | Admitting: Physician Assistant

## 2018-05-05 VITALS — BP 143/85 | HR 74 | Wt 199.0 lb

## 2018-05-05 DIAGNOSIS — Z9189 Other specified personal risk factors, not elsewhere classified: Secondary | ICD-10-CM

## 2018-05-05 DIAGNOSIS — Z8639 Personal history of other endocrine, nutritional and metabolic disease: Secondary | ICD-10-CM | POA: Insufficient documentation

## 2018-05-05 DIAGNOSIS — Z130 Encounter for screening for diseases of the blood and blood-forming organs and certain disorders involving the immune mechanism: Secondary | ICD-10-CM

## 2018-05-05 DIAGNOSIS — Z1231 Encounter for screening mammogram for malignant neoplasm of breast: Secondary | ICD-10-CM | POA: Diagnosis not present

## 2018-05-05 DIAGNOSIS — Z79899 Other long term (current) drug therapy: Secondary | ICD-10-CM | POA: Diagnosis not present

## 2018-05-05 DIAGNOSIS — E0500 Thyrotoxicosis with diffuse goiter without thyrotoxic crisis or storm: Secondary | ICD-10-CM | POA: Diagnosis not present

## 2018-05-05 DIAGNOSIS — G4452 New daily persistent headache (NDPH): Secondary | ICD-10-CM

## 2018-05-05 DIAGNOSIS — I10 Essential (primary) hypertension: Secondary | ICD-10-CM

## 2018-05-05 DIAGNOSIS — Z1389 Encounter for screening for other disorder: Secondary | ICD-10-CM

## 2018-05-05 DIAGNOSIS — Z13220 Encounter for screening for lipoid disorders: Secondary | ICD-10-CM | POA: Diagnosis not present

## 2018-05-05 DIAGNOSIS — Z131 Encounter for screening for diabetes mellitus: Secondary | ICD-10-CM

## 2018-05-05 DIAGNOSIS — E785 Hyperlipidemia, unspecified: Secondary | ICD-10-CM | POA: Diagnosis not present

## 2018-05-05 HISTORY — DX: New daily persistent headache (ndph): G44.52

## 2018-05-05 MED ORDER — ASPIRIN EC 81 MG PO TBEC: 81.0000 mg | DELAYED_RELEASE_TABLET | Freq: Every day | ORAL | 3 refills | Status: AC

## 2018-05-05 MED ORDER — HYDROCHLOROTHIAZIDE 25 MG PO TABS: 25.0000 mg | ORAL_TABLET | Freq: Every day | ORAL | 0 refills | Status: DC

## 2018-05-05 MED ORDER — AMLODIPINE BESYLATE 5 MG PO TABS: 5.0000 mg | ORAL_TABLET | Freq: Every day | ORAL | 0 refills | Status: DC

## 2018-05-05 NOTE — Progress Notes (Signed)
HPI:                                                                Hailey Robinson is a 57 y.o. female who presents to [facility]: [facility] today for hypertension follow-up  HTN: lost to follow-up and ran out of her HCTZ about 3 months ago. Checks BP's at home. BP range 150's-180's/90's-110. Has been having frequent headaches. Denies vision change, chest pain with exertion, orthopnea, lightheadedness, syncope and edema. Risk factors include: tobacco use  Headaches: reports she has been having daily headaches for 2-3 months. Headaches are bilateral, mild-moderate. Headaches are not typically associated with vision change, diplopia, photophobia, phonophobia, dizziness, nausea or vomiting. Reports on Tuesday she had a severe, debilitating headache associated with photophobia and nausea. Reports her ears felt hot. Denies any known triggers. She frequently skips meals and reports she has not been sleeping well. Has been taking Excedrin and Ibuprofen almost daily.  Reports she was diagnosed with bronchitis in the [facility] ED on June 2nd. Treated with steroids, antibiotics and cough syrup.  No flowsheet data found.  No flowsheet data found.    Past Medical History:  Diagnosis Date  . Anemia   . Arthritis    "think I have some in my lower back" (02/10/2017)  . Functional systolic murmur    normal ECHO 07/2016  . GERD (gastroesophageal reflux disease)    spicy foods  . Hypertension   . Left thyroid nodule 11/04/2016  . Pneumonia 10/2016  . Seasonal allergies    Past Surgical History:  Procedure Laterality Date  . BIOPSY THYROID  12/2016 X 2  . LACERATION REPAIR Right 1985   pinky  . THYROID LOBECTOMY Left 02/10/2017  . THYROID LOBECTOMY Left 02/10/2017   Procedure: LEFT THYROID LOBECTOMY;  Surgeon: Ralene Ok, MD;  Location: [place];  Service: General;  Laterality: Left;  . TUBAL LIGATION     Social History   Tobacco Use  . Smoking  status: Current Every Day Smoker    Packs/day: 0.50    Years: 15.00    Pack years: 7.50    Types: Cigarettes  . Smokeless tobacco: Never Used  Substance Use Topics  . Alcohol use: Yes    Alcohol/week: 1.2 oz    Types: 2 Cans of beer per week    Comment: "weekends only"   family history includes Hyperlipidemia in her father and mother; Hypertension in her father and mother; Thyroid disease in her sister.    ROS: negative except as noted in the HPI  Medications: Current Outpatient Medications  Medication Sig Dispense Refill  . ibuprofen (ADVIL,MOTRIN) 200 MG tablet Take 600 mg by mouth every 8 (eight) hours as needed (for knee pain/cramps).    . Loratadine (TGT ALLERGY RELIEF NON-DROWSY PO) Take 10 mg by mouth daily with lunch.    . potassium chloride SA (K-DUR,KLOR-CON) 20 MEQ tablet TAKE 1 TABLET BY MOUTH DAILY 90 tablet 0   No current facility-administered medications for this visit.    Allergies  Allergen Reactions  . No Known Allergies        Objective:  BP (!) 143/85   Pulse 74   Wt 199 lb (90.3 kg)   BMI 32.12 kg/m  Gen: well-groomed, not ill-appearing,  no acute distress, obese female HEENT: head normocephalic, atraumatic; conjunctiva and cornea clear, wearing glasses, oropharynx clear, moist mucus membranes; neck supple, no meningeal signs Pulm: Normal work of breathing, normal phonation, clear to auscultation bilaterally CV: Normal rate, regular rhythm, s1 and s2 distinct, no murmurs, clicks or rubs Neuro:  cranial nerves II-XII intact, no nystagmus, normal finger-to-nose, normal heel-to-shin, negative pronator drift, normal rapid alternating movements, DTR's intact, normal tone, no tremor MSK: strength 5/5 and symmetric in bilateral upper and lower extremities, normal gait and station, negative Romberg Mental Status: alert and oriented x 3, speech articulate, and thought processes clear and goal-directed    No results found for this or any previous visit  (from the past 72 hour(s)). No results found.    Assessment and Plan: 58 y.o. female with   Encounter for medication management - Plan: CBC, Comprehensive metabolic panel, Lipid Panel w/reflex Direct LDL  Essential hypertension - Plan: Comprehensive metabolic panel, amLODipine (NORVASC) 5 MG tablet, hydrochlorothiazide (HYDRODIURIL) 25 MG tablet, aspirin EC 81 MG tablet, Urinalysis, Routine w reflex microscopic  Graves' disease in remission  Breast cancer screening by mammogram - Plan: MM 3D SCREEN BREAST BILATERAL  Screening for diabetes mellitus - Plan: Comprehensive metabolic panel  Screening for lipid disorders - Plan: Lipid Panel w/reflex Direct LDL  Screening for blood disease - Plan: CBC  New daily persistent headache  Screening for blood or protein in urine - Plan: Urinalysis, Routine w reflex microscopic  Hypertension BP Readings from Last 3 Encounters:  05/05/18 (!) 143/85  06/17/17 124/82  02/11/17 (!) 129/55  - BP poorly controlled due to medication non-compliance. I suspect this is the cause of her headaches - refilling 30 day supply of HCTZ 25 mg and adding Amlodipine since I do not have recent renal function - CMP and UA pending  New daily persistent headache - reassuring neuro exam, no focal symptoms. No thunderclap. The severe headache she had this week had migrainous features. Given new onset and age>50 I will have a low threshold to obtain advanced imaging if her headaches do not improve with blood pressure control. Close follow-up in 2 weeks. ED precautions reviewed   Patient education and anticipatory guidance given Patient agrees with treatment plan Follow-up in 2 weeks or sooner as needed if symptoms worsen or fail to improve  Darlyne Russian PA-C

## 2018-05-05 NOTE — Patient Instructions (Addendum)
For your blood pressure: - Goal <130/80 - baby aspirin 81 mg daily to help prevent heart attack/stroke - start Amlodipine and Hydrochlorothiazide every morning - monitor and log blood pressures at home - check around the same time each day in a relaxed setting - Limit salt to <2000 mg/day - Follow DASH eating plan - limit alcohol to 2 standard drinks per day for men and 1 per day for women - avoid tobacco products - weight loss: 7% of current body weight - follow-up every 6 months for your blood pressure    General Headache Without Cause A headache is pain or discomfort felt around the head or neck area. The specific cause of a headache may not be found. There are many causes and types of headaches. A few common ones are:  Tension headaches.  Migraine headaches.  Cluster headaches.  Chronic daily headaches.  Follow these instructions at home: Watch your condition for any changes. Take these steps to help with your condition: Managing pain  Take over-the-counter and prescription medicines only as told by your health care provider.  Lie down in a dark, quiet room when you have a headache.  If directed, apply ice to the head and neck area: ? Put ice in a plastic bag. ? Place a towel between your skin and the bag. ? Leave the ice on for 20 minutes, 2-3 times per day.  Use a heating pad or hot shower to apply heat to the head and neck area as told by your health care provider.  Keep lights dim if bright lights bother you or make your headaches worse. Eating and drinking  Eat meals on a regular schedule.  Limit alcohol use.  Decrease the amount of caffeine you drink, or stop drinking caffeine. General instructions  Keep all follow-up visits as told by your health care provider. This is important.  Keep a headache journal to help find out what may trigger your headaches. For example, write down: ? What you eat and drink. ? How much sleep you get. ? Any change to your  diet or medicines.  Try massage or other relaxation techniques.  Limit stress.  Sit up straight, and do not tense your muscles.  Do not use tobacco products, including cigarettes, chewing tobacco, or e-cigarettes. If you need help quitting, ask your health care provider.  Exercise regularly as told by your health care provider.  Sleep on a regular schedule. Get 7-9 hours of sleep, or the amount recommended by your health care provider. Contact a health care provider if:  Your symptoms are not helped by medicine.  You have a headache that is different from the usual headache.  You have nausea or you vomit.  You have a fever. Get help right away if:  Your headache becomes severe.  You have repeated vomiting.  You have a stiff neck.  You have a loss of vision.  You have problems with speech.  You have pain in the eye or ear.  You have muscular weakness or loss of muscle control.  You lose your balance or have trouble walking.  You feel faint or pass out.  You have confusion. This information is not intended to replace advice given to you by your health care provider. Make sure you discuss any questions you have with your health care provider. Document Released: 11/09/2005 Document Revised: 04/16/2016 Document Reviewed: 03/04/2015 Elsevier Interactive Patient Education  [employer].

## 2018-05-06 ENCOUNTER — Encounter: Payer: Self-pay | Admitting: Physician Assistant

## 2018-05-06 DIAGNOSIS — E785 Hyperlipidemia, unspecified: Secondary | ICD-10-CM | POA: Insufficient documentation

## 2018-05-06 LAB — CBC
HCT: 40.6 % (ref 35.0–45.0)
HEMOGLOBIN: 13.9 g/dL (ref 11.7–15.5)
MCH: 29.4 pg (ref 27.0–33.0)
MCHC: 34.2 g/dL (ref 32.0–36.0)
MCV: 86 fL (ref 80.0–100.0)
MPV: 10.9 fL (ref 7.5–12.5)
Platelets: 197 10*3/uL (ref 140–400)
RBC: 4.72 10*6/uL (ref 3.80–5.10)
RDW: 13.9 % (ref 11.0–15.0)
WBC: 3.2 10*3/uL — AB (ref 3.8–10.8)

## 2018-05-06 LAB — COMPREHENSIVE METABOLIC PANEL
AG RATIO: 1.3 (calc) (ref 1.0–2.5)
ALT: 16 U/L (ref 6–29)
AST: 14 U/L (ref 10–35)
Albumin: 3.9 g/dL (ref 3.6–5.1)
Alkaline phosphatase (APISO): 77 U/L (ref 33–130)
BUN: 11 mg/dL (ref 7–25)
CO2: 31 mmol/L (ref 20–32)
CREATININE: 0.62 mg/dL (ref 0.50–1.05)
Calcium: 9.2 mg/dL (ref 8.6–10.4)
Chloride: 106 mmol/L (ref 98–110)
GLUCOSE: 101 mg/dL — AB (ref 65–99)
Globulin: 2.9 g/dL (calc) (ref 1.9–3.7)
Potassium: 3.5 mmol/L (ref 3.5–5.3)
Sodium: 143 mmol/L (ref 135–146)
TOTAL PROTEIN: 6.8 g/dL (ref 6.1–8.1)
Total Bilirubin: 0.3 mg/dL (ref 0.2–1.2)

## 2018-05-06 LAB — URINALYSIS, ROUTINE W REFLEX MICROSCOPIC
BILIRUBIN URINE: NEGATIVE
GLUCOSE, UA: NEGATIVE
Hgb urine dipstick: NEGATIVE
Ketones, ur: NEGATIVE
Leukocytes, UA: NEGATIVE
Nitrite: NEGATIVE
PROTEIN: NEGATIVE
Specific Gravity, Urine: 1.022 (ref 1.001–1.03)
pH: 5.5 (ref 5.0–8.0)

## 2018-05-06 LAB — LIPID PANEL W/REFLEX DIRECT LDL
Cholesterol: 169 mg/dL (ref <200)
HDL: 47 mg/dL — ABNORMAL LOW (ref >50)
LDL CHOLESTEROL (CALC): 108 mg/dL — AB
NON-HDL CHOLESTEROL (CALC): 122 mg/dL (ref <130)
TRIGLYCERIDES: 56 mg/dL (ref <150)
Total CHOL/HDL Ratio: 3.6 (calc) (ref <5.0)

## 2018-05-06 NOTE — Progress Notes (Signed)
Cholesterol is borderline high. Because she smokes and has high blood pressure, I would recommend cholesterol lowering medicine in addition to [place], regular aerobic exercise and weight loss White blood cells are a little low. Recheck in 1 month

## 2018-05-10 DIAGNOSIS — Z9189 Other specified personal risk factors, not elsewhere classified: Secondary | ICD-10-CM | POA: Insufficient documentation

## 2018-05-10 MED ORDER — ATORVASTATIN CALCIUM 10 MG PO TABS: 10.0000 mg | ORAL_TABLET | Freq: Every day | ORAL | 1 refills | Status: DC

## 2018-05-10 NOTE — Addendum Note (Signed)
Addended by: Nelson Chimes E on: 05/10/2018 04:42 PM   Modules accepted: Orders

## 2018-05-19 ENCOUNTER — Ambulatory Visit: Payer: Federal, State, Local not specified - PPO | Admitting: Physician Assistant

## 2018-05-20 ENCOUNTER — Encounter: Payer: Self-pay | Admitting: Physician Assistant

## 2018-05-20 ENCOUNTER — Ambulatory Visit (INDEPENDENT_AMBULATORY_CARE_PROVIDER_SITE_OTHER): Payer: Federal, State, Local not specified - PPO | Admitting: Physician Assistant

## 2018-05-20 VITALS — BP 121/76 | HR 78 | Temp 97.7°F | Wt 195.0 lb

## 2018-05-20 DIAGNOSIS — Z716 Tobacco abuse counseling: Secondary | ICD-10-CM | POA: Diagnosis not present

## 2018-05-20 DIAGNOSIS — F17210 Nicotine dependence, cigarettes, uncomplicated: Secondary | ICD-10-CM | POA: Diagnosis not present

## 2018-05-20 DIAGNOSIS — I10 Essential (primary) hypertension: Secondary | ICD-10-CM

## 2018-05-20 DIAGNOSIS — E785 Hyperlipidemia, unspecified: Secondary | ICD-10-CM | POA: Diagnosis not present

## 2018-05-20 DIAGNOSIS — D72819 Decreased white blood cell count, unspecified: Secondary | ICD-10-CM

## 2018-05-20 MED ORDER — NICOTINE 21 MG/24HR TD PT24: 21.0000 mg | MEDICATED_PATCH | Freq: Every day | TRANSDERMAL | 0 refills | Status: DC

## 2018-05-20 NOTE — Patient Instructions (Addendum)
My Quit Plan - set a quit date for approximately 2 weeks from now - on your quit date, no more cigarettes - continue the patch daily - make a plan for dealing with cravings - download a quit app - MyQuit ([employer]'s free app) - call the [facility] Quit Line to speak a Quit Coach - Text START to [phone] to sign up for free text messages  Week 1: 21 mg patch, use gum or lozenge every 4 hours as needed for cravings (optional) Week 2: 21 mg patch, use gum/lozenge every 8 hours as needed for cravings (optional) Week 3: 21 mg patch only Week 4: 14 mg patch, use gum or lozenge every 4 hours as needed for cravings (optional) Week 5: 14 mg patch,  use gum/lozenge every 8 hours as needed for cravings (optional)

## 2018-05-20 NOTE — Progress Notes (Signed)
HPI:                                                                Hailey Robinson is a 57 y.o. female who presents to [facility] [place] today for headache and hypertension follow-up  HTN: BP elevated at last office visit, patient had run out of her HCTZ. She was re-started on thiazide and Amlodipine 5 mg was added 2 weeks ago. Compliant with medications. Checks BP's at home. BP range 130's/70's.  She has reduced smoking to 10 cigarettes per day. Headaches have improved. Denies vision change,  chest pain with exertion, orthopnea, lightheadedness, syncope and edema. Risk factors include: tobacco use, obesity  Headaches: improved, has had 4 in the last 2 weeks. Headaches are left-sided fronto-temporal. Headaches respond to Tylenol and Excedrin. 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 She is perimenopausal, LMP March 2019. Endorses vasomotor symptoms.   Tobacco use 1/2 ppd x 16 years Recently reduced to 10 cigarettes per day over the last 2 weeks. Began nicotine patch step 1, 2 weeks ago Longest Quit Attempt: 10 days, >5 years ago  Depression screen [facility] 2/9 05/20/2018  Decreased Interest 1  Down, Depressed, Hopeless 0  PHQ - 2 Score 1  Altered sleeping 3  Tired, decreased energy 1  Change in appetite 0  Feeling bad or failure about yourself  0  Trouble concentrating 0  Moving slowly or fidgety/restless 0  Suicidal thoughts 0  PHQ-9 Score 5  Difficult doing work/chores Somewhat difficult    GAD 7 : Generalized Anxiety Score 05/20/2018  Nervous, Anxious, on Edge 0  Control/stop worrying 0  Worry too much - different things 0  Trouble relaxing 1  Restless 0  Easily annoyed or irritable 1  Afraid - awful might happen 1  Total GAD 7 Score 3  Anxiety Difficulty Not difficult at all      Past Medical History:  Diagnosis Date  . Anemia   . Arthritis    "think I have some in my lower back" (02/10/2017)  . Functional systolic  murmur    normal ECHO 07/2016  . GERD (gastroesophageal reflux disease)    spicy foods  . Hypertension   . Left thyroid nodule 11/04/2016  . Pneumonia 10/2016  . Seasonal allergies   . Unilateral vocal cord paralysis    Past Surgical History:  Procedure Laterality Date  . BIOPSY THYROID  12/2016 X 2  . LACERATION REPAIR Right 1985   pinky  . THYROID LOBECTOMY Left 02/10/2017  . THYROID LOBECTOMY Left 02/10/2017   Procedure: LEFT THYROID LOBECTOMY;  Surgeon: Ralene Ok, MD;  Location: [place];  Service: General;  Laterality: Left;  . TUBAL LIGATION     Social History   Tobacco Use  . Smoking status: Current Every Day Smoker    Packs/day: 0.50    Years: 15.00    Pack years: 7.50    Types: Cigarettes  . Smokeless tobacco: Never Used  Substance Use Topics  . Alcohol use: Yes    Alcohol/week: 1.2 oz    Types: 2 Cans of beer per week    Comment: "weekends only"   family history includes Hyperlipidemia in her father and mother; Hypertension in her father and mother; Thyroid disease in  her sister.    ROS: negative except as noted in the HPI  Medications: Current Outpatient Medications  Medication Sig Dispense Refill  . amLODipine (NORVASC) 5 MG tablet Take 1 tablet (5 mg total) by mouth daily. 30 tablet 0  . aspirin EC 81 MG tablet Take 1 tablet (81 mg total) by mouth daily. 90 tablet 3  . atorvastatin (LIPITOR) 10 MG tablet Take 1 tablet (10 mg total) by mouth at bedtime. 90 tablet 1  . hydrochlorothiazide (HYDRODIURIL) 25 MG tablet Take 1 tablet (25 mg total) by mouth daily. 30 tablet 0  . Loratadine (TGT ALLERGY RELIEF NON-DROWSY PO) Take 10 mg by mouth daily with lunch.    . nicotine (NICODERM CQ - DOSED IN MG/24 HOURS) 21 mg/24hr patch Place 1 patch (21 mg total) onto the skin daily. 28 patch 0  . potassium chloride SA (K-DUR,KLOR-CON) 20 MEQ tablet TAKE 1 TABLET BY MOUTH DAILY 90 tablet 0   No current facility-administered medications for this visit.    Allergies   Allergen Reactions  . No Known Allergies        Objective:  BP 121/76   Pulse 78   Temp 97.7 F (36.5 C) (Oral)   Wt 195 lb (88.5 kg)   LMP 01/24/2018 (Approximate)   BMI 31.47 kg/m  Gen:  alert, not ill-appearing, no distress, appropriate for age, obese female HEENT: head normocephalic without obvious abnormality, conjunctiva and cornea clear, trachea midline Pulm: Normal work of breathing, voice is hoarse Neuro: alert and oriented x 3, no tremor MSK: extremities atraumatic, normal gait and station Skin: intact, no rashes on exposed skin, no jaundice, no cyanosis Psych: well-groomed, cooperative, good eye contact, euthymic mood, affect mood-congruent, speech is articulate, and thought processes clear and goal-directed    No results found for this or any previous visit (from the past 72 hour(s)). No results found.    Assessment and Plan: 57 y.o. female with   Encounter for smoking cessation counseling  Dyslipidemia, goal LDL below 70  Essential hypertension  Cigarette nicotine dependence without complication - Plan: nicotine (NICODERM CQ - DOSED IN MG/24 HOURS) 21 mg/24hr patch  Leukopenia, unspecified type - Plan: CBC with Differential/Platelet  HTN BP Readings from Last 3 Encounters:  05/20/18 121/76  05/05/18 (!) 143/85  06/17/17 124/82  - BP in range - continue Amlodipine 5 mg and HCTZ 25 mg - baby asa primary prevention  Smoking cessation counseling, nicotine dependence The patient was counseled on the dangers of tobacco use, and was advised to quit and counseled on nicotine replacement therapy.  Reviewed strategies to maximize success, including removing cigarettes and smoking materials from environment, substitution of other forms of reinforcement, support of family/friends, written materials and pharmacotherapy (Nicoderm).  Leukopenia WBC 3.2, 2 weeks ago. Patient had a viral illness at the time. Recheck WBC to ensure resolution  Patient education  and anticipatory guidance given Patient agrees with treatment plan Follow-up as needed if symptoms worsen or fail to improve  Darlyne Russian PA-C

## 2018-05-26 ENCOUNTER — Encounter: Payer: Self-pay | Admitting: Physician Assistant

## 2018-05-26 DIAGNOSIS — D72819 Decreased white blood cell count, unspecified: Secondary | ICD-10-CM | POA: Insufficient documentation

## 2018-05-26 DIAGNOSIS — Z716 Tobacco abuse counseling: Secondary | ICD-10-CM | POA: Insufficient documentation

## 2018-05-26 DIAGNOSIS — F17210 Nicotine dependence, cigarettes, uncomplicated: Secondary | ICD-10-CM | POA: Insufficient documentation

## 2018-05-26 HISTORY — DX: Tobacco abuse counseling: Z71.6

## 2018-05-26 HISTORY — DX: Decreased white blood cell count, unspecified: D72.819

## 2018-05-30 ENCOUNTER — Other Ambulatory Visit: Payer: Self-pay | Admitting: Physician Assistant

## 2018-05-30 DIAGNOSIS — I10 Essential (primary) hypertension: Secondary | ICD-10-CM

## 2018-06-01 ENCOUNTER — Ambulatory Visit: Payer: Federal, State, Local not specified - PPO | Admitting: Internal Medicine

## 2018-06-01 DIAGNOSIS — Z0289 Encounter for other administrative examinations: Secondary | ICD-10-CM

## 2018-06-01 NOTE — Progress Notes (Deleted)
Patient ID: Hailey Robinson, female   DOB: [birth year], 57 y.o.   MRN: [id number]    HPI  Hailey Robinson is a 57 y.o.-year-old female, returning for f/u for for Graves' disease, goiter, low TSH and a thyroid nodule >> now s/p L lobectomy.  Last visit 1 year ago.  Reviewed and addended history: She had the flu  In 10/2016 >> went to UC >> CXR (11/02/2016) showed PNA + incidental thyroid nodule: IMPRESSION: 1. Patchy right perihilar infiltrate. 2. No significant change rightward displacement of trachea due to complex enlarged left thyroid lobe on 07/05/2015 cervical spine CT. Thyroid ultrasound would be helpful in further evaluation.  Thyroid U/S (11/19/2016): Left lobe enlarged, 10.1 x 4 x 3.7 cm, containing a isoechoic solid 3.2 x 2.2 x 6.0 cm nodule  The nodule was attempted to be biopsied on 12/08/2016 but the sample was insufficient.  She had a repeat attempted biopsy on 12/17/2016. Results: Benign.  She had a left lobectomy 02/10/2017 (Dr. Rosendo Gros) as she had neck compression symptoms.  I reviewed patient's TFTs -remains normal after her surgery: Lab Results  Component Value Date   TSH 1.53 06/17/2017   TSH 0.82 12/18/2016   TSH 0.22 (L) 11/04/2016   FREET4 0.83 06/17/2017   FREET4 0.79 12/18/2016   FREET4 1.2 11/04/2016    Her TPO and ATA antibodies were normal but TSI and thyroglobulin were high: Component     Latest Ref Rng & Units 11/04/2016  Thyroperoxidase Ab SerPl-aCnc     <9 IU/mL 1  Thyroglobulin Ab     <2 IU/mL <1  Thyroglobulin     ng/mL >450000.0 (H)  TSI     <140 % baseline 338 (H)   Pt denies: - feeling nodules in neck - dysphagia - choking - SOB with lying down  But continues to have: - hoarseness  + FH of thyroid ds. In sister - hyperthyroidism - had thyroidectomy. No FH of thyroid cancer. No h/o radiation tx to head or neck.  No seaweed or kelp. No recent contrast studies. No herbal supplements. No Biotin use. No recent steroids use.   Pt  also has a history of HTN.  ROS: Constitutional: no weight gain/no weight loss, no fatigue, no subjective hyperthermia, no subjective hypothermia Eyes: no blurry vision, no xerophthalmia ENT: no sore throat, + see HPI Cardiovascular: no CP/no SOB/no palpitations/no leg swelling Respiratory: no cough/no SOB/no wheezing Gastrointestinal: no N/no V/no D/no C/no acid reflux Musculoskeletal: no muscle aches/no joint aches Skin: no rashes, no hair loss Neurological: no tremors/no numbness/no tingling/no dizziness  I reviewed pt's medications, allergies, PMH, social hx, family hx, and changes were documented in the history of present illness. Otherwise, unchanged from my initial visit note.  Past Medical History:  Diagnosis Date  . Anemia   . Arthritis    "think I have some in my lower back" (02/10/2017)  . Functional systolic murmur    normal ECHO 07/2016  . GERD (gastroesophageal reflux disease)    spicy foods  . Hypertension   . Left thyroid nodule 11/04/2016  . Pneumonia 10/2016  . Seasonal allergies   . Unilateral vocal cord paralysis    Past Surgical History:  Procedure Laterality Date  . BIOPSY THYROID  12/2016 X 2  . LACERATION REPAIR Right 1985   pinky  . THYROID LOBECTOMY Left 02/10/2017  . THYROID LOBECTOMY Left 02/10/2017   Procedure: LEFT THYROID LOBECTOMY;  Surgeon: Ralene Ok, MD;  Location: [place];  Service: General;  Laterality: Left;  .  TUBAL LIGATION     Social History   Social History  . Marital status: Married    Spouse name: N/A  . Number of children: 3   Occupational History  .  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 Smoking status: Current Every Day Smoker    Packs/day: 0.50    Years: 10.00  . Smokeless tobacco: Never Used  . Alcohol use 1.2 oz/week    2 Cans of beer per week  . Drug use: No   Current Outpatient Medications on File Prior to Visit  Medication Sig Dispense Refill  . amLODipine (NORVASC) 5  MG tablet TAKE 1 TABLET BY MOUTH EVERY DAY 30 tablet 1  . aspirin EC 81 MG tablet Take 1 tablet (81 mg total) by mouth daily. 90 tablet 3  . atorvastatin (LIPITOR) 10 MG tablet Take 1 tablet (10 mg total) by mouth at bedtime. 90 tablet 1  . hydrochlorothiazide (HYDRODIURIL) 25 MG tablet TAKE 1 TABLET BY MOUTH DAILY 30 tablet 1  . Loratadine (TGT ALLERGY RELIEF NON-DROWSY PO) Take 10 mg by mouth daily with lunch.    . nicotine (NICODERM CQ - DOSED IN MG/24 HOURS) 21 mg/24hr patch Place 1 patch (21 mg total) onto the skin daily. 28 patch 0  . potassium chloride SA (K-DUR,KLOR-CON) 20 MEQ tablet TAKE 1 TABLET BY MOUTH DAILY 90 tablet 0   No current facility-administered medications on file prior to visit.    Allergies  Allergen Reactions  . No Known Allergies    Family History  Problem Relation Age of Onset  . Hyperlipidemia Mother   . Hypertension Mother   . Hypertension Father   . Hyperlipidemia Father   . Thyroid disease Sister    PE: There were no vitals taken for this visit. Wt Readings from Last 3 Encounters:  05/20/18 195 lb (88.5 kg)  05/05/18 199 lb (90.3 kg)  06/17/17 190 lb (86.2 kg)   Constitutional: overweight, in NAD Eyes: PERRLA, EOMI, + B exophthalmos ENT: moist mucous membranes, thyroidectomy scar healed, no cervical lymphadenopathy Cardiovascular: RRR, No MRG Respiratory: CTA B Gastrointestinal: abdomen soft, NT, ND, BS+ Musculoskeletal: no deformities, strength intact in all 4 Skin: moist, warm, no rashes Neurological: no tremor with outstretched hands, DTR normal in all 4   ASSESSMENT: 1. S/p L thyroidectomy - h/o L thyroid nodule with neck compression sxs  2. H/o Graves ds - with elevated TSI antibodies  3.  Exophthalmos   PLAN: 1.  S/p left thyroidectomy - she has a history of a left large thyroid nodule with neck compression symptoms, for which she had left thyroidectomy in 01/2017.  She felt well after the surgery, however, she continued to have  hoarseness even 4 months after the surgery when I saw her last.  At that time, we discussed about the possibility of voice rehabilitation.  She saw ENT since then and she was found to have vocal cord paralysis and had injections in her vocal cords. - Her thyroidectomy scar has healed  - we will check her TFTs today  2. H/o Graves ds. - Patient had transiently low TSH with normal free hormones.  TSI antibodies and her thyroglobulin level were high and her TPO and ATA antibodies were normal. - She most likely had mild Graves' disease, however, with associated exophthalmos, improved after her left thyroidectomy, per latest TSH obtained in 06/2017 - today, will repeat a TSH, free T4, free T3, and TSI  3. Exophthalmos  - At last visit, eyes were protruding at 18 mm by exophthalmometry (at 113), which is normal, but this may be an increase from her previous values.   - She is seeing ophthalmology  Philemon Kingdom, MD PhD [facility]

## 2018-06-03 ENCOUNTER — Other Ambulatory Visit: Payer: Self-pay | Admitting: Physician Assistant

## 2018-06-03 DIAGNOSIS — T502X5A Adverse effect of carbonic-anhydrase inhibitors, benzothiadiazides and other diuretics, initial encounter: Principal | ICD-10-CM

## 2018-06-03 DIAGNOSIS — E876 Hypokalemia: Secondary | ICD-10-CM

## 2018-06-07 ENCOUNTER — Other Ambulatory Visit: Payer: Self-pay

## 2018-06-07 DIAGNOSIS — T502X5A Adverse effect of carbonic-anhydrase inhibitors, benzothiadiazides and other diuretics, initial encounter: Principal | ICD-10-CM

## 2018-06-07 DIAGNOSIS — E876 Hypokalemia: Secondary | ICD-10-CM

## 2018-06-07 MED ORDER — POTASSIUM CHLORIDE CRYS ER 20 MEQ PO TBCR: 20.0000 meq | EXTENDED_RELEASE_TABLET | Freq: Every day | ORAL | 0 refills | Status: DC

## 2018-06-15 ENCOUNTER — Ambulatory Visit: Payer: Federal, State, Local not specified - PPO | Admitting: Physician Assistant

## 2018-07-28 ENCOUNTER — Other Ambulatory Visit: Payer: Self-pay | Admitting: Physician Assistant

## 2018-07-28 DIAGNOSIS — I10 Essential (primary) hypertension: Secondary | ICD-10-CM

## 2018-08-23 DIAGNOSIS — B9689 Other specified bacterial agents as the cause of diseases classified elsewhere: Secondary | ICD-10-CM | POA: Diagnosis not present

## 2018-08-23 DIAGNOSIS — J019 Acute sinusitis, unspecified: Secondary | ICD-10-CM | POA: Diagnosis not present

## 2018-08-23 DIAGNOSIS — Z885 Allergy status to narcotic agent status: Secondary | ICD-10-CM | POA: Diagnosis not present

## 2018-08-23 DIAGNOSIS — Z79899 Other long term (current) drug therapy: Secondary | ICD-10-CM | POA: Diagnosis not present

## 2018-08-23 DIAGNOSIS — J3489 Other specified disorders of nose and nasal sinuses: Secondary | ICD-10-CM | POA: Diagnosis not present

## 2018-08-23 DIAGNOSIS — F17210 Nicotine dependence, cigarettes, uncomplicated: Secondary | ICD-10-CM | POA: Diagnosis not present

## 2018-10-26 ENCOUNTER — Other Ambulatory Visit: Payer: Self-pay | Admitting: Physician Assistant

## 2018-10-26 DIAGNOSIS — I10 Essential (primary) hypertension: Secondary | ICD-10-CM

## 2018-10-26 DIAGNOSIS — E785 Hyperlipidemia, unspecified: Secondary | ICD-10-CM

## 2018-10-26 DIAGNOSIS — Z9189 Other specified personal risk factors, not elsewhere classified: Secondary | ICD-10-CM

## 2018-12-07 DIAGNOSIS — F17210 Nicotine dependence, cigarettes, uncomplicated: Secondary | ICD-10-CM | POA: Diagnosis not present

## 2018-12-07 DIAGNOSIS — K0889 Other specified disorders of teeth and supporting structures: Secondary | ICD-10-CM | POA: Diagnosis not present

## 2018-12-07 DIAGNOSIS — Z885 Allergy status to narcotic agent status: Secondary | ICD-10-CM | POA: Diagnosis not present

## 2018-12-27 DIAGNOSIS — K029 Dental caries, unspecified: Secondary | ICD-10-CM | POA: Diagnosis not present

## 2018-12-27 DIAGNOSIS — F17210 Nicotine dependence, cigarettes, uncomplicated: Secondary | ICD-10-CM | POA: Diagnosis not present

## 2018-12-27 DIAGNOSIS — R6884 Jaw pain: Secondary | ICD-10-CM | POA: Diagnosis not present

## 2018-12-27 DIAGNOSIS — K0889 Other specified disorders of teeth and supporting structures: Secondary | ICD-10-CM | POA: Diagnosis not present

## 2018-12-27 DIAGNOSIS — Z791 Long term (current) use of non-steroidal anti-inflammatories (NSAID): Secondary | ICD-10-CM | POA: Diagnosis not present

## 2018-12-27 DIAGNOSIS — R0981 Nasal congestion: Secondary | ICD-10-CM | POA: Diagnosis not present

## 2018-12-27 DIAGNOSIS — Z885 Allergy status to narcotic agent status: Secondary | ICD-10-CM | POA: Diagnosis not present

## 2018-12-27 DIAGNOSIS — Z79899 Other long term (current) drug therapy: Secondary | ICD-10-CM | POA: Diagnosis not present

## 2018-12-27 DIAGNOSIS — E041 Nontoxic single thyroid nodule: Secondary | ICD-10-CM | POA: Diagnosis not present

## 2019-01-24 ENCOUNTER — Other Ambulatory Visit: Payer: Self-pay

## 2019-01-24 DIAGNOSIS — I10 Essential (primary) hypertension: Secondary | ICD-10-CM

## 2019-01-24 MED ORDER — AMLODIPINE BESYLATE 5 MG PO TABS: 5.0000 mg | ORAL_TABLET | Freq: Every day | ORAL | 0 refills | Status: DC

## 2019-01-24 MED ORDER — HYDROCHLOROTHIAZIDE 25 MG PO TABS: 25.0000 mg | ORAL_TABLET | Freq: Every day | ORAL | 0 refills | Status: DC

## 2019-02-12 DIAGNOSIS — K5900 Constipation, unspecified: Secondary | ICD-10-CM | POA: Diagnosis not present

## 2019-02-12 DIAGNOSIS — R1110 Vomiting, unspecified: Secondary | ICD-10-CM | POA: Diagnosis not present

## 2019-02-12 DIAGNOSIS — R110 Nausea: Secondary | ICD-10-CM | POA: Diagnosis not present

## 2019-02-14 DIAGNOSIS — J3802 Paralysis of vocal cords and larynx, bilateral: Secondary | ICD-10-CM | POA: Diagnosis not present

## 2019-02-14 DIAGNOSIS — Z7982 Long term (current) use of aspirin: Secondary | ICD-10-CM | POA: Diagnosis not present

## 2019-02-14 DIAGNOSIS — K529 Noninfective gastroenteritis and colitis, unspecified: Secondary | ICD-10-CM

## 2019-02-14 DIAGNOSIS — I10 Essential (primary) hypertension: Secondary | ICD-10-CM | POA: Diagnosis not present

## 2019-02-14 DIAGNOSIS — K8020 Calculus of gallbladder without cholecystitis without obstruction: Secondary | ICD-10-CM | POA: Diagnosis not present

## 2019-02-14 DIAGNOSIS — E876 Hypokalemia: Secondary | ICD-10-CM | POA: Diagnosis not present

## 2019-02-14 DIAGNOSIS — Z791 Long term (current) use of non-steroidal anti-inflammatories (NSAID): Secondary | ICD-10-CM | POA: Diagnosis not present

## 2019-02-14 DIAGNOSIS — E785 Hyperlipidemia, unspecified: Secondary | ICD-10-CM | POA: Diagnosis not present

## 2019-02-14 DIAGNOSIS — K6389 Other specified diseases of intestine: Secondary | ICD-10-CM | POA: Diagnosis not present

## 2019-02-14 DIAGNOSIS — Z79899 Other long term (current) drug therapy: Secondary | ICD-10-CM | POA: Diagnosis not present

## 2019-02-14 DIAGNOSIS — A0472 Enterocolitis due to Clostridium difficile, not specified as recurrent: Secondary | ICD-10-CM | POA: Diagnosis not present

## 2019-02-14 DIAGNOSIS — K760 Fatty (change of) liver, not elsewhere classified: Secondary | ICD-10-CM | POA: Diagnosis not present

## 2019-02-14 DIAGNOSIS — F17210 Nicotine dependence, cigarettes, uncomplicated: Secondary | ICD-10-CM | POA: Diagnosis not present

## 2019-02-14 HISTORY — DX: Noninfective gastroenteritis and colitis, unspecified: K52.9

## 2019-02-15 DIAGNOSIS — K529 Noninfective gastroenteritis and colitis, unspecified: Secondary | ICD-10-CM | POA: Diagnosis not present

## 2019-02-16 DIAGNOSIS — K529 Noninfective gastroenteritis and colitis, unspecified: Secondary | ICD-10-CM | POA: Diagnosis not present

## 2019-02-17 DIAGNOSIS — K529 Noninfective gastroenteritis and colitis, unspecified: Secondary | ICD-10-CM | POA: Diagnosis not present

## 2019-02-18 MED ORDER — POTASSIUM CHLORIDE CRYS ER 20 MEQ PO TBCR
40.00 | EXTENDED_RELEASE_TABLET | ORAL | Status: DC
Start: 2019-02-17 — End: 2019-02-18

## 2019-02-18 MED ORDER — AMLODIPINE BESYLATE 5 MG PO TABS
5.00 | ORAL_TABLET | ORAL | Status: DC
Start: 2019-02-18 — End: 2019-02-18

## 2019-02-18 MED ORDER — GENERIC EXTERNAL MEDICATION
Status: DC
Start: ? — End: 2019-02-18

## 2019-02-18 MED ORDER — HYDROCHLOROTHIAZIDE 25 MG PO TABS
25.00 | ORAL_TABLET | ORAL | Status: DC
Start: 2019-02-18 — End: 2019-02-18

## 2019-02-18 MED ORDER — SODIUM CHLORIDE 0.9 % IV SOLN
10.00 | INTRAVENOUS | Status: DC
Start: ? — End: 2019-02-18

## 2019-02-18 MED ORDER — ENOXAPARIN SODIUM 40 MG/0.4ML ~~LOC~~ SOLN
40.00 | SUBCUTANEOUS | Status: DC
Start: 2019-02-17 — End: 2019-02-18

## 2019-02-18 MED ORDER — VANCOMYCIN HCL 125 MG PO CAPS
125.00 | ORAL_CAPSULE | ORAL | Status: DC
Start: 2019-02-17 — End: 2019-02-18

## 2019-02-18 MED ORDER — HYDROCODONE-ACETAMINOPHEN 5-325 MG PO TABS
1.00 | ORAL_TABLET | ORAL | Status: DC
Start: ? — End: 2019-02-18

## 2019-02-18 MED ORDER — SIMETHICONE 80 MG PO CHEW
80.00 | CHEWABLE_TABLET | ORAL | Status: DC
Start: ? — End: 2019-02-18

## 2019-02-21 ENCOUNTER — Telehealth: Payer: Self-pay

## 2019-02-21 DIAGNOSIS — E876 Hypokalemia: Secondary | ICD-10-CM

## 2019-02-21 DIAGNOSIS — D72829 Elevated white blood cell count, unspecified: Secondary | ICD-10-CM

## 2019-02-21 DIAGNOSIS — R739 Hyperglycemia, unspecified: Secondary | ICD-10-CM

## 2019-02-21 NOTE — Telephone Encounter (Signed)
Surgeon from [facility] called - she was under the impression that Dr. Sheppard Coil was pt's primary provider. She informed me that pt was in [place] ER on 02/14/19. Pt was there for a few days. As per surgeon, pt labs showed abnormal levels for potassium and leukocytosis. Upon discharge, pt was given a rx for potassium. Pt was instructed by ER provider to follow up with PCP 7 - 10 days for recheck on labs. If provider has any questions, surgeon can be contacted at [phone].

## 2019-02-21 NOTE — Telephone Encounter (Signed)
Labs ordered Make sure she is scheduled for a hospital follow-up

## 2019-02-22 NOTE — Telephone Encounter (Signed)
Pt scheduled -EH/RMA

## 2019-02-23 ENCOUNTER — Other Ambulatory Visit: Payer: Self-pay | Admitting: Physician Assistant

## 2019-02-23 DIAGNOSIS — I10 Essential (primary) hypertension: Secondary | ICD-10-CM

## 2019-02-27 ENCOUNTER — Encounter: Payer: Self-pay | Admitting: Osteopathic Medicine

## 2019-02-27 ENCOUNTER — Inpatient Hospital Stay: Payer: Federal, State, Local not specified - PPO | Admitting: Osteopathic Medicine

## 2019-02-27 ENCOUNTER — Inpatient Hospital Stay: Payer: Federal, State, Local not specified - PPO | Admitting: Physician Assistant

## 2019-02-27 ENCOUNTER — Other Ambulatory Visit: Payer: Self-pay

## 2019-02-27 ENCOUNTER — Ambulatory Visit (INDEPENDENT_AMBULATORY_CARE_PROVIDER_SITE_OTHER): Payer: Federal, State, Local not specified - PPO | Admitting: Osteopathic Medicine

## 2019-02-27 VITALS — BP 138/77 | HR 90 | Temp 98.0°F | Wt 185.9 lb

## 2019-02-27 DIAGNOSIS — A0472 Enterocolitis due to Clostridium difficile, not specified as recurrent: Secondary | ICD-10-CM | POA: Diagnosis not present

## 2019-02-27 DIAGNOSIS — K529 Noninfective gastroenteritis and colitis, unspecified: Secondary | ICD-10-CM | POA: Diagnosis not present

## 2019-02-27 MED ORDER — ONDANSETRON 8 MG PO TBDP: 8.0000 mg | ORAL_TABLET | Freq: Three times a day (TID) | ORAL | 0 refills | Status: DC | PRN

## 2019-02-27 NOTE — Patient Instructions (Signed)
Plan: Finish antibiotics OK to go back to work as long as no fever Please seek medical care if you start to feel worse I sent anti-nausea medications to the pharmacy

## 2019-02-27 NOTE — Progress Notes (Signed)
HPI: Hailey Robinson is a 58 y.o. female who  has a past medical history of Anemia, Arthritis, Functional systolic murmur, GERD (gastroesophageal reflux disease), Hypertension, Left thyroid nodule (11/04/2016), Pneumonia (10/2016), Seasonal allergies, and Unilateral vocal cord paralysis.  she presents to [facility] today, 02/27/19,  for chief complaint of: Hospital follow-up, colitis  D/C summary reviewed:  Presented to ED 02/14/2019, admitted for colitis, found to be (+)CDiff, LOS 3 days then improvement and d/c home to f/u outpatient. Advised repeat labs, recheck K+. Also arranged f/u with GI 2 wk post d/c has apt for 03/08/19.   Today, pt reports feeling well, improving over the past 2 days, abdomen still a bit tender but not bad. Still on po vancomycin abx. 13 day total course.      Past medical, surgical, social and family history reviewed:  Patient Active Problem List   Diagnosis Date Noted  . Encounter for smoking cessation counseling 05/26/2018  . Cigarette nicotine dependence without complication 65/99/3570  . Leukopenia 05/26/2018  . At risk for cardiovascular event 05/10/2018  . Dyslipidemia, goal LDL below 70 05/06/2018  . H/O Graves' disease 05/05/2018  . New daily persistent headache 05/05/2018  . Unilateral vocal cord paralysis 12/07/2017  . 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 Grade I internal hemorrhoids 04/13/2017  . S/P partial thyroidectomy 02/10/2017  . Anemia 02/03/2017  . Menorrhagia 02/03/2017  . Encounter for medication management 11/04/2016  . Essential hypertension 11/04/2016  . Perimenopause 11/04/2016  . Pilonidal sinus 11/04/2016    Past Surgical History:  Procedure Laterality Date  . BIOPSY THYROID  12/2016 X 2  . LACERATION REPAIR Right 1985   pinky  . THYROID LOBECTOMY Left 02/10/2017  . THYROID LOBECTOMY Left 02/10/2017   Procedure: LEFT THYROID LOBECTOMY;  Surgeon: Ralene Ok, MD;  Location: [facility];   Service: General;  Laterality: Left;  . TUBAL LIGATION      Social History   Tobacco Use  . Smoking status: Current Every Day Smoker    Packs/day: 0.50    Years: 15.00    Pack years: 7.50    Types: Cigarettes  . Smokeless tobacco: Never Used  . Tobacco comment: As per pt - smokes 10 cigs a day  Substance Use Topics  . Alcohol use: Yes    Alcohol/week: 2.0 standard drinks    Types: 2 Cans of beer per week    Comment: "weekends only"    Family History  Problem Relation Age of Onset  . Hyperlipidemia Mother   . Hypertension Mother   . Hypertension Father   . Hyperlipidemia Father   . Thyroid disease Sister      Current medication list and allergy/intolerance information reviewed:    Current Outpatient Medications  Medication Sig Dispense Refill  . amLODipine (NORVASC) 5 MG tablet Take 1 tablet (5 mg total) by mouth daily. Due for follow up visit w/PCP 30 tablet 0  . aspirin EC 81 MG tablet Take 1 tablet (81 mg total) by mouth daily. 90 tablet 3  . atorvastatin (LIPITOR) 10 MG tablet TAKE ONE TABLET BT MOUTH AT BEDTIME 90 tablet 1  . hydrochlorothiazide (HYDRODIURIL) 25 MG tablet TAKE 1 TABLET BY MOUTH DAILY 30 tablet 0  . potassium chloride SA (K-DUR,KLOR-CON) 20 MEQ tablet TAKE 1 TABLET BY MOUTH DAILY (Patient taking differently: 40 mEq. ) 90 tablet 0  . potassium chloride SA (K-DUR,KLOR-CON) 20 MEQ tablet Take 1 tablet (20 mEq total) by mouth daily. 90 tablet 0  . Loratadine (TGT  ALLERGY RELIEF NON-DROWSY PO) Take 10 mg by mouth daily with lunch.    . nicotine (NICODERM CQ - DOSED IN MG/24 HOURS) 21 mg/24hr patch Place 1 patch (21 mg total) onto the skin daily. (Patient not taking: Reported on 02/27/2019) 28 patch 0  . ondansetron (ZOFRAN-ODT) 8 MG disintegrating tablet Take 1 tablet (8 mg total) by mouth every 8 (eight) hours as needed for nausea. 20 tablet 0   No current facility-administered medications for this visit.     Allergies  Allergen Reactions  . No Known  Allergies       Review of Systems:  Constitutional:  No  fever, no chills, No significant fatigue.   HEENT: No  headach  Cardiac: No  chest pain, No  pressure, No palpitations, No  Orthopnea  Respiratory:  No  shortness of breath. No  Cough  Gastrointestinal: +abdominal pain, +nausea, No  vomiting,  No  blood in stool, No  diarrhea, No  constipation   Musculoskeletal: No new myalgia/arthralgia  Skin: No  Rash  Neurologic: No  weakness, No  dizziness   Exam:  BP 138/77 (BP Location: Left Arm, Patient Position: Sitting, Cuff Size: Normal)   Pulse 90   Temp 98 F (36.7 C) (Oral)   Wt 185 lb 14.4 oz (84.3 kg)   BMI 30.01 kg/m   Constitutional: VS see above. General Appearance: alert, well-developed, well-nourished, NAD  Eyes: Normal lids and conjunctive, non-icteric sclera  Ears, Nose, Mouth, Throat: MMM, Normal external inspection ears/nares/mouth/lips/gums.   Neck: No masses, trachea midline.   Respiratory: Normal respiratory effort.  Gastrointestinal: Mild LLQ tenderness without rebound/guarding, no masses. No hepatomegaly, no splenomegaly. No hernia appreciated. Bowel sounds normal. Rectal exam deferred.   Musculoskeletal: Gait normal. No clubbing/cyanosis of digits.   Neurological: Normal balance/coordination. No tremor.   Skin: warm, dry, intact. No rash/ulcer.  Psychiatric: Normal judgment/insight. Normal mood and affect. Oriented x3.    No results found for this or any previous visit (from the past 72 hour(s)).  No results found.   ASSESSMENT/PLAN: The primary encounter diagnosis was Colitis. A diagnosis of C. difficile diarrhea was also pertinent to this visit.   Orders Placed This Encounter  Procedures  . CBC  . COMPLETE METABOLIC PANEL WITH GFR    Meds ordered this encounter  Medications  . ondansetron (ZOFRAN-ODT) 8 MG disintegrating tablet    Sig: Take 1 tablet (8 mg total) by mouth every 8 (eight) hours as needed for nausea.     Dispense:  20 tablet    Refill:  0    Patient Instructions  Plan: Finish antibiotics OK to go back to work as long as no fever Please seek medical care if you start to feel worse I sent anti-nausea medications to the pharmacy         Visit summary with medication list and pertinent instructions was printed for patient to review. All questions at time of visit were answered - patient instructed to contact office with any additional concerns or updates. ER/RTC precautions were reviewed with the patient.      Please note: voice recognition software was used to produce this document, and typos may escape review. Please contact Dr. Sheppard Coil for any needed clarifications.     Follow-up plan: Return for routine care as directed by PCP, otherwise as needed for new or concerning symptoms .

## 2019-02-28 LAB — COMPLETE METABOLIC PANEL WITH GFR
AG Ratio: 1.1 (calc) (ref 1.0–2.5)
ALT: 16 U/L (ref 6–29)
AST: 17 U/L (ref 10–35)
Albumin: 4 g/dL (ref 3.6–5.1)
Alkaline phosphatase (APISO): 78 U/L (ref 37–153)
BUN: 7 mg/dL (ref 7–25)
CO2: 30 mmol/L (ref 20–32)
Calcium: 9.8 mg/dL (ref 8.6–10.4)
Chloride: 100 mmol/L (ref 98–110)
Creat: 0.68 mg/dL (ref 0.50–1.05)
GFR, Est African American: 113 mL/min/{1.73_m2} (ref >=60)
GFR, Est Non African American: 98 mL/min/{1.73_m2} (ref >=60)
Globulin: 3.7 g/dL (calc) (ref 1.9–3.7)
Glucose, Bld: 98 mg/dL (ref 65–99)
Potassium: 3.6 mmol/L (ref 3.5–5.3)
Sodium: 141 mmol/L (ref 135–146)
Total Bilirubin: 0.3 mg/dL (ref 0.2–1.2)
Total Protein: 7.7 g/dL (ref 6.1–8.1)

## 2019-02-28 LAB — CBC
HCT: 40.5 % (ref 35.0–45.0)
Hemoglobin: 13.6 g/dL (ref 11.7–15.5)
MCH: 29.3 pg (ref 27.0–33.0)
MCHC: 33.6 g/dL (ref 32.0–36.0)
MCV: 87.3 fL (ref 80.0–100.0)
MPV: 10.7 fL (ref 7.5–12.5)
Platelets: 377 10*3/uL (ref 140–400)
RBC: 4.64 10*6/uL (ref 3.80–5.10)
RDW: 13.3 % (ref 11.0–15.0)
WBC: 5.6 10*3/uL (ref 3.8–10.8)

## 2019-03-08 DIAGNOSIS — R634 Abnormal weight loss: Secondary | ICD-10-CM | POA: Diagnosis not present

## 2019-03-08 DIAGNOSIS — R933 Abnormal findings on diagnostic imaging of other parts of digestive tract: Secondary | ICD-10-CM | POA: Diagnosis not present

## 2019-03-08 DIAGNOSIS — R110 Nausea: Secondary | ICD-10-CM | POA: Diagnosis not present

## 2019-03-08 DIAGNOSIS — K5900 Constipation, unspecified: Secondary | ICD-10-CM | POA: Diagnosis not present

## 2019-03-23 ENCOUNTER — Ambulatory Visit (INDEPENDENT_AMBULATORY_CARE_PROVIDER_SITE_OTHER): Payer: Federal, State, Local not specified - PPO | Admitting: Physician Assistant

## 2019-03-23 ENCOUNTER — Other Ambulatory Visit: Payer: Self-pay

## 2019-03-23 VITALS — BP 134/94 | HR 88

## 2019-03-23 DIAGNOSIS — F430 Acute stress reaction: Secondary | ICD-10-CM | POA: Diagnosis not present

## 2019-03-23 DIAGNOSIS — R5383 Other fatigue: Secondary | ICD-10-CM | POA: Diagnosis not present

## 2019-03-23 DIAGNOSIS — R0981 Nasal congestion: Secondary | ICD-10-CM

## 2019-03-23 DIAGNOSIS — G479 Sleep disorder, unspecified: Secondary | ICD-10-CM | POA: Diagnosis not present

## 2019-03-23 MED ORDER — LEVOCETIRIZINE DIHYDROCHLORIDE 5 MG PO TABS: 5.0000 mg | ORAL_TABLET | Freq: Every evening | ORAL | 3 refills | Status: DC

## 2019-03-23 MED ORDER — DOXEPIN HCL 25 MG PO CAPS: 25.0000 mg | ORAL_CAPSULE | Freq: Every evening | ORAL | 0 refills | Status: DC | PRN

## 2019-03-23 MED ORDER — FLUTICASONE PROPIONATE 50 MCG/ACT NA SUSP: 1.0000 | Freq: Every day | NASAL | 0 refills | Status: DC

## 2019-03-23 NOTE — Progress Notes (Signed)
Virtual Visit via Telephone Note  I connected with Hailey Robinson on 03/23/19 at  2:20 PM EDT by telephone and verified that I am speaking with the correct person using two identifiers.   I discussed the limitations, risks, security and privacy concerns of performing an evaluation and management service by telephone and the availability of in person appointments. I also discussed with the patient that there may be a patient responsible charge related to this service. The patient expressed understanding and agreed to proceed.   History of Present Illness: HPI:                                                                Hailey Robinson is a 58 y.o. female   CC: fatigue  Reports feeling very fatigued and tired for the last 3 days. Denies fever, chills, loss of appetite, weight change. Recently hospitalized for C. Diff colitis approx 1 month ago. Completed Vancomycin. Follow-up CBC and BMP were unremarkable. Denies abdominal pain or change in bowel habits  Reports "feels like I have a stuffy head." Denies purulent nasal drainage, sinus pain/dental pain, headache.  Admits to feeling stressed about her health, her job and the COVID-19 pandemic. Admits she has been having trouble falling asleep and occasional nighttime awakening.   Past Medical History:  Diagnosis Date  . Anemia   . Arthritis    "think I have some in my lower back" (02/10/2017)  . Functional systolic murmur    normal ECHO 07/2016  . GERD (gastroesophageal reflux disease)    spicy foods  . Hypertension   . Left thyroid nodule 11/04/2016  . Pneumonia 10/2016  . Seasonal allergies   . Unilateral vocal cord paralysis    Past Surgical History:  Procedure Laterality Date  . BIOPSY THYROID  12/2016 X 2  . LACERATION REPAIR Right 1985   pinky  . THYROID LOBECTOMY Left 02/10/2017  . THYROID LOBECTOMY Left 02/10/2017   Procedure: LEFT THYROID LOBECTOMY;  Surgeon: Ralene Ok, MD;  Location: [facility];  Service:  General;  Laterality: Left;  . TUBAL LIGATION     Social History   Tobacco Use  . Smoking status: Current Every Day Smoker    Packs/day: 0.50    Years: 15.00    Pack years: 7.50    Types: Cigarettes  . Smokeless tobacco: Never Used  . Tobacco comment: As per pt - smokes 10 cigs a day  Substance Use Topics  . Alcohol use: Yes    Alcohol/week: 2.0 standard drinks    Types: 2 Cans of beer per week    Comment: "weekends only"   family history includes Hyperlipidemia in her father and mother; Hypertension in her father and mother; Thyroid disease in her sister.    ROS: negative except as noted in the HPI  Medications: Current Outpatient Medications  Medication Sig Dispense Refill  . amLODipine (NORVASC) 5 MG tablet Take 1 tablet (5 mg total) by mouth daily. Due for follow up visit w/PCP 30 tablet 0  . aspirin EC 81 MG tablet Take 1 tablet (81 mg total) by mouth daily. 90 tablet 3  . atorvastatin (LIPITOR) 10 MG tablet TAKE ONE TABLET BT MOUTH AT BEDTIME 90 tablet 1  . hydrochlorothiazide (HYDRODIURIL) 25 MG tablet TAKE 1 TABLET BY  MOUTH DAILY 30 tablet 0  . potassium chloride SA (K-DUR,KLOR-CON) 20 MEQ tablet TAKE 1 TABLET BY MOUTH DAILY (Patient taking differently: 40 mEq. ) 90 tablet 0  . Loratadine (TGT ALLERGY RELIEF NON-DROWSY PO) Take 10 mg by mouth daily with lunch.    . nicotine (NICODERM CQ - DOSED IN MG/24 HOURS) 21 mg/24hr patch Place 1 patch (21 mg total) onto the skin daily. (Patient not taking: Reported on 02/27/2019) 28 patch 0  . ondansetron (ZOFRAN-ODT) 8 MG disintegrating tablet Take 1 tablet (8 mg total) by mouth every 8 (eight) hours as needed for nausea. (Patient not taking: Reported on 03/23/2019) 20 tablet 0   No current facility-administered medications for this visit.    Allergies  Allergen Reactions  . No Known Allergies        Objective:  BP (!) 134/94   Pulse 88  Pulm: speaking in full sentences Neuro: alert and oriented x 3  Wt Readings from  Last 3 Encounters:  02/27/19 185 lb 14.4 oz (84.3 kg)  05/20/18 195 lb (88.5 kg)  05/05/18 199 lb (90.3 kg)        Assessment and Plan: 58 y.o. female with   .Diagnoses and all orders for this visit:  Fatigue, unspecified type  Nasal congestion -     fluticasone (FLONASE) 50 MCG/ACT nasal spray; Place 1 spray into both nostrils daily. -     levocetirizine (XYZAL ALLERGY 24HR) 5 MG tablet; Take 1 tablet (5 mg total) by mouth every evening.  Acute stress reaction -     doxepin (SINEQUAN) 25 MG capsule; Take 1 capsule (25 mg total) by mouth at bedtime as needed (sleep/anxiety).  Sleep disturbance -     doxepin (SINEQUAN) 25 MG capsule; Take 1 capsule (25 mg total) by mouth at bedtime as needed (sleep/anxiety).   New onset fatigue x 2 days Weight is down about 10 pounds, likely due to colitis Normal CBC and BMP within the last 30 days Recent illness and increased stress likely contributory Starting Doxepin for anxiety/insomnia She declined work accommodations / time off Recommend follow-up in office for lab work-up if symptoms worsen or fail to improve  Follow Up Instructions:    I discussed the assessment and treatment plan with the patient. The patient was provided an opportunity to ask questions and all were answered. The patient agreed with the plan and demonstrated an understanding of the instructions.   The patient was advised to call back or seek an in-person evaluation if the symptoms worsen or if the condition fails to improve as anticipated.  I provided 11-20 minutes of non-face-to-face time during this encounter.   Trixie Dredge, [place]

## 2019-03-28 ENCOUNTER — Other Ambulatory Visit: Payer: Self-pay

## 2019-03-28 ENCOUNTER — Ambulatory Visit (INDEPENDENT_AMBULATORY_CARE_PROVIDER_SITE_OTHER): Payer: Federal, State, Local not specified - PPO

## 2019-03-28 ENCOUNTER — Ambulatory Visit (INDEPENDENT_AMBULATORY_CARE_PROVIDER_SITE_OTHER): Payer: Federal, State, Local not specified - PPO | Admitting: Physician Assistant

## 2019-03-28 ENCOUNTER — Encounter: Payer: Self-pay | Admitting: Physician Assistant

## 2019-03-28 VITALS — BP 127/79 | HR 75 | Temp 97.9°F | Wt 183.0 lb

## 2019-03-28 DIAGNOSIS — R3129 Other microscopic hematuria: Secondary | ICD-10-CM | POA: Diagnosis not present

## 2019-03-28 DIAGNOSIS — R29898 Other symptoms and signs involving the musculoskeletal system: Secondary | ICD-10-CM | POA: Diagnosis not present

## 2019-03-28 DIAGNOSIS — E890 Postprocedural hypothyroidism: Secondary | ICD-10-CM

## 2019-03-28 DIAGNOSIS — R918 Other nonspecific abnormal finding of lung field: Secondary | ICD-10-CM | POA: Diagnosis not present

## 2019-03-28 DIAGNOSIS — I10 Essential (primary) hypertension: Secondary | ICD-10-CM

## 2019-03-28 DIAGNOSIS — E876 Hypokalemia: Secondary | ICD-10-CM

## 2019-03-28 DIAGNOSIS — R5383 Other fatigue: Secondary | ICD-10-CM | POA: Diagnosis not present

## 2019-03-28 DIAGNOSIS — R7303 Prediabetes: Secondary | ICD-10-CM | POA: Diagnosis not present

## 2019-03-28 DIAGNOSIS — Z9889 Other specified postprocedural states: Secondary | ICD-10-CM

## 2019-03-28 DIAGNOSIS — M5030 Other cervical disc degeneration, unspecified cervical region: Secondary | ICD-10-CM

## 2019-03-28 DIAGNOSIS — Z114 Encounter for screening for human immunodeficiency virus [HIV]: Secondary | ICD-10-CM

## 2019-03-28 DIAGNOSIS — R4189 Other symptoms and signs involving cognitive functions and awareness: Secondary | ICD-10-CM

## 2019-03-28 DIAGNOSIS — M4722 Other spondylosis with radiculopathy, cervical region: Secondary | ICD-10-CM

## 2019-03-28 DIAGNOSIS — M50323 Other cervical disc degeneration at C6-C7 level: Secondary | ICD-10-CM | POA: Diagnosis not present

## 2019-03-28 HISTORY — DX: Other fatigue: R53.83

## 2019-03-28 HISTORY — DX: Other symptoms and signs involving cognitive functions and awareness: R41.89

## 2019-03-28 HISTORY — DX: Other symptoms and signs involving the musculoskeletal system: R29.898

## 2019-03-28 LAB — POCT URINALYSIS DIPSTICK
Bilirubin, UA: NEGATIVE
Glucose, UA: NEGATIVE
Ketones, UA: NEGATIVE
Leukocytes, UA: NEGATIVE
Nitrite, UA: NEGATIVE
Protein, UA: POSITIVE — AB
Spec Grav, UA: <=1.005 — AB (ref 1.010–1.025)
Urobilinogen, UA: 0.2 E.U./dL
pH, UA: 6 (ref 5.0–8.0)

## 2019-03-28 NOTE — Progress Notes (Signed)
HPI:                                                                Hailey Robinson is a 58 y.o. female who presents to [facility]: [facility] today for fatigue follow-up  New onset left arm weakness for unknown amount of time, possibly 1 month ago. Denies known injury. Denies neck/shoulder pain. Denies altered sensation/paresthesias. Denies lower extremity weakness/gait disturbance/falls. She is right-handed.   Also having intense fatigue associated with increased forgetfulness described as "mind foggy." Reports she had to call out of work on Sunday due to lack of energy. 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   She was recently hospitalized for C. Diff colitis on February 14, 2019 and completed treatment. Her follow-up CBC and CMP were normal approx 1 month ago. She denies abdominal pain, diarrhea, hematochezia/melena    Past Medical History:  Diagnosis Date  . Anemia   . Arthritis    "think I have some in my lower back" (02/10/2017)  . Functional systolic murmur    normal ECHO 07/2016  . GERD (gastroesophageal reflux disease)    spicy foods  . Hypertension   . Left thyroid nodule 11/04/2016  . Pneumonia 10/2016  . Seasonal allergies   . Unilateral vocal cord paralysis    Past Surgical History:  Procedure Laterality Date  . BIOPSY THYROID  12/2016 X 2  . LACERATION REPAIR Right 1985   pinky  . THYROID LOBECTOMY Left 02/10/2017  . THYROID LOBECTOMY Left 02/10/2017   Procedure: LEFT THYROID LOBECTOMY;  Surgeon: Ralene Ok, MD;  Location: [facility];  Service: General;  Laterality: Left;  . TUBAL LIGATION     Social History   Tobacco Use  . Smoking status: Current Every Day Smoker    Packs/day: 0.50    Years: 15.00    Pack years: 7.50    Types: Cigarettes  . Smokeless tobacco: Never Used  . Tobacco comment: As per pt - smokes 10 cigs a day  Substance Use Topics  . Alcohol use: Yes     Alcohol/week: 2.0 standard drinks    Types: 2 Cans of beer per week    Comment: "weekends only"   family history includes Hyperlipidemia in her father and mother; Hypertension in her father and mother; Thyroid disease in her sister.    Review of Systems  Constitutional: Positive for fatigue. Negative for appetite change, chills, fever and unexpected weight change.  HENT: Positive for congestion and trouble swallowing. Negative for sinus pressure and sinus pain.   Gastrointestinal: Negative for abdominal distention, abdominal pain, blood in stool, diarrhea and nausea.  Musculoskeletal: Positive for back pain (chronic LBP). Negative for arthralgias, gait problem, joint swelling, myalgias and neck pain.  Neurological: Positive for weakness (left upper extremity). Negative for light-headedness and headaches.  Psychiatric/Behavioral: Negative for dysphoric mood. The patient is nervous/anxious.   All other systems reviewed and are negative.    Medications: Current Outpatient Medications  Medication Sig Dispense Refill  . amLODipine (NORVASC) 5 MG tablet Take 1 tablet (5 mg total) by mouth daily. Due for follow up visit w/PCP 30 tablet 0  . aspirin EC 81 MG tablet Take 1  tablet (81 mg total) by mouth daily. 90 tablet 3  . atorvastatin (LIPITOR) 10 MG tablet TAKE ONE TABLET BT MOUTH AT BEDTIME 90 tablet 1  . doxepin (SINEQUAN) 25 MG capsule Take 1 capsule (25 mg total) by mouth at bedtime as needed (sleep/anxiety). 30 capsule 0  . fluticasone (FLONASE) 50 MCG/ACT nasal spray Place 1 spray into both nostrils daily. 16 g 0  . hydrochlorothiazide (HYDRODIURIL) 25 MG tablet TAKE 1 TABLET BY MOUTH DAILY 30 tablet 0  . levocetirizine (XYZAL ALLERGY 24HR) 5 MG tablet Take 1 tablet (5 mg total) by mouth every evening. 90 tablet 3   No current facility-administered medications for this visit.    Allergies  Allergen Reactions  . No Known Allergies        Objective:  BP 127/79   Pulse 75    Temp 97.9 F (36.6 C) (Oral)   Wt 183 lb (83 kg)   SpO2 98%   BMI 29.54 kg/m  Gen: well-groomed, not ill-appearing, no acute distress HEENT: head normocephalic, atraumatic; conjunctiva and cornea clear, oropharynx clear, moist mucus membranes; neck supple, no meningeal signs Pulm: Normal work of breathing, normal phonation, clear to auscultation bilaterally CV: Normal rate, regular rhythm, s1 and s2 distinct, no murmurs, clicks or rubs GI: abdomen soft, there is LLQ tenderness without guarding or rigidity Neuro:  cranial nerves II-XII intact, no nystagmus, normal rapid alternating movements, normal tone, no tremor MSK: strength 5/5 and symmetric in bilateral lower extremities, normal gait and station, diminished strength 4/5 of left upper extremity at the shoulder (flexion) and grip strength compared to 5/5 on the right, normal left arm extension strength Mental Status: alert and oriented x 3, speech articulate, and thought processes clear and goal-directed  Wt Readings from Last 3 Encounters:  03/28/19 183 lb (83 kg)  02/27/19 185 lb 14.4 oz (84.3 kg)  05/20/18 195 lb (88.5 kg)    Lab Results  Component Value Date   CREATININE 0.68 02/27/2019   BUN 7 02/27/2019   NA 141 02/27/2019   K 3.6 02/27/2019   CL 100 02/27/2019   CO2 30 02/27/2019   Lab Results  Component Value Date   WBC 5.6 02/27/2019   HGB 13.6 02/27/2019   HCT 40.5 02/27/2019   MCV 87.3 02/27/2019   PLT 377 02/27/2019     Assessment and Plan: 58 y.o. female with   .Amerika was seen today for follow-up.  Diagnoses and all orders for this visit:  Fatigue, unspecified type -     CBC with Differential/Platelet -     TSH + free T4 -     COMPLETE METABOLIC PANEL WITH GFR -     Fe+TIBC+Fer -     Hemoglobin A1c -     C-reactive protein -     Sedimentation rate -     RPR -     HIV antibody (with reflex) -     DG Chest 2 View  Status post partial thyroidectomy Comments: left lobectomy Orders: -     TSH  + free T4  Weakness of left upper extremity -     CBC with Differential/Platelet -     TSH + free T4 -     COMPLETE METABOLIC PANEL WITH GFR -     Fe+TIBC+Fer -     Hemoglobin A1c -     C-reactive protein -     Sedimentation rate -     RPR -     DG Chest 2 View -  DG Cervical Spine Complete; Future  Cognitive change -     RPR -     HIV antibody (with reflex)  Microscopic hematuria -     POCT Urinalysis Dipstick -     Urine Microscopic; Future  Encounter for screening for HIV -     HIV antibody (with reflex)  Essential hypertension -     hydrochlorothiazide (HYDRODIURIL) 25 MG tablet; Take 1 tablet (25 mg total) by mouth every morning. -     amLODipine (NORVASC) 5 MG tablet; Take 1 tablet (5 mg total) by mouth daily.  Hypokalemia due to excessive renal loss of potassium -     potassium chloride SA (K-DUR) 20 MEQ tablet; Take 1 tablet (20 mEq total) by mouth daily.   Fatigue New onset fatigue in the absence of other constitutional symptoms and recent treatment for C. Diff colitis approx 6 weeks ago.  CBC, iron indices, ESR, CRP, TSH, A1C pending Work note provided for remainder of this week  Left upper extremity weakness Subacute, onset unknown, possibly 1 month ago unilateral extremity weakness with decreased grip strength and shoulder flexion with normal arm flexion, normal sensation and no pain Low clinical suspicion for stroke in absence of other neurologic symptoms or deficitis DDx includes cervical radiculopathy, brachial plexopathy, myopathy Cervical and CXR pending  Patient education and anticipatory guidance given Patient agrees with treatment plan Follow-up in 2 weeks or sooner as needed if symptoms worsen or fail to improve  Darlyne Russian PA-C

## 2019-03-28 NOTE — Patient Instructions (Addendum)

## 2019-03-29 ENCOUNTER — Encounter: Payer: Self-pay | Admitting: Physician Assistant

## 2019-03-29 DIAGNOSIS — E876 Hypokalemia: Secondary | ICD-10-CM | POA: Insufficient documentation

## 2019-03-29 DIAGNOSIS — R7982 Elevated C-reactive protein (CRP): Secondary | ICD-10-CM

## 2019-03-29 DIAGNOSIS — J42 Unspecified chronic bronchitis: Secondary | ICD-10-CM

## 2019-03-29 DIAGNOSIS — M47812 Spondylosis without myelopathy or radiculopathy, cervical region: Secondary | ICD-10-CM | POA: Insufficient documentation

## 2019-03-29 DIAGNOSIS — E611 Iron deficiency: Secondary | ICD-10-CM | POA: Insufficient documentation

## 2019-03-29 DIAGNOSIS — R7303 Prediabetes: Secondary | ICD-10-CM

## 2019-03-29 HISTORY — DX: Spondylosis without myelopathy or radiculopathy, cervical region: M47.812

## 2019-03-29 HISTORY — DX: Prediabetes: R73.03

## 2019-03-29 HISTORY — DX: Unspecified chronic bronchitis: J42

## 2019-03-29 HISTORY — DX: Iron deficiency: E61.1

## 2019-03-29 HISTORY — DX: Elevated C-reactive protein (CRP): R79.82

## 2019-03-29 LAB — URINALYSIS, MICROSCOPIC ONLY
Bacteria, UA: NONE SEEN /HPF
Hyaline Cast: NONE SEEN /LPF

## 2019-03-29 MED ORDER — AMLODIPINE BESYLATE 5 MG PO TABS: 5.0000 mg | ORAL_TABLET | Freq: Every day | ORAL | 1 refills | Status: DC

## 2019-03-29 MED ORDER — HYDROCHLOROTHIAZIDE 25 MG PO TABS: 25.0000 mg | ORAL_TABLET | ORAL | 1 refills | Status: DC

## 2019-03-29 MED ORDER — POTASSIUM CHLORIDE CRYS ER 20 MEQ PO TBCR: 20.0000 meq | EXTENDED_RELEASE_TABLET | Freq: Every day | ORAL | 1 refills | Status: DC

## 2019-03-29 NOTE — Progress Notes (Signed)
In terms of fatigue: Iron level is a little low, which may be due to recent colitis. Start ferrous sulfate 325 mg one dose every other day with a meal Inflammatory markers are elevated. I have added on some other tests to rule out lyme disease and autoimmune illness. These may take 3 days or so to come back  Other abnormal findings: Potassium level is low, likely due to the blood pressure medication Start KlorCon 20 meq daily and continue for as long as you are taking Hydrochlorothiazide A1C is in a prediabetic range. Recommend following DASH diet and limiting carbs to 30g per meal. Recheck in 6 months

## 2019-03-30 ENCOUNTER — Encounter: Payer: Self-pay | Admitting: Physician Assistant

## 2019-03-31 LAB — CBC WITH DIFFERENTIAL/PLATELET
Absolute Monocytes: 333 cells/uL (ref 200–950)
Basophils Absolute: 18 cells/uL (ref 0–200)
Basophils Relative: 0.4 %
Eosinophils Absolute: 261 cells/uL (ref 15–500)
Eosinophils Relative: 5.8 %
HCT: 38.5 % (ref 35.0–45.0)
Hemoglobin: 12.8 g/dL (ref 11.7–15.5)
Lymphs Abs: 1431 cells/uL (ref 850–3900)
MCH: 28.5 pg (ref 27.0–33.0)
MCHC: 33.2 g/dL (ref 32.0–36.0)
MCV: 85.7 fL (ref 80.0–100.0)
MPV: 10.7 fL (ref 7.5–12.5)
Monocytes Relative: 7.4 %
Neutro Abs: 2457 cells/uL (ref 1500–7800)
Neutrophils Relative %: 54.6 %
Platelets: 235 10*3/uL (ref 140–400)
RBC: 4.49 10*6/uL (ref 3.80–5.10)
RDW: 14.3 % (ref 11.0–15.0)
Total Lymphocyte: 31.8 %
WBC: 4.5 10*3/uL (ref 3.8–10.8)

## 2019-03-31 LAB — COMPLETE METABOLIC PANEL WITH GFR
AG Ratio: 1.5 (calc) (ref 1.0–2.5)
ALT: 11 U/L (ref 6–29)
AST: 14 U/L (ref 10–35)
Albumin: 4.3 g/dL (ref 3.6–5.1)
Alkaline phosphatase (APISO): 69 U/L (ref 37–153)
BUN: 9 mg/dL (ref 7–25)
CO2: 30 mmol/L (ref 20–32)
Calcium: 9.4 mg/dL (ref 8.6–10.4)
Chloride: 104 mmol/L (ref 98–110)
Creat: 0.53 mg/dL (ref 0.50–1.05)
GFR, Est African American: 122 mL/min/{1.73_m2} (ref >=60)
GFR, Est Non African American: 105 mL/min/{1.73_m2} (ref >=60)
Globulin: 2.9 g/dL (calc) (ref 1.9–3.7)
Glucose, Bld: 92 mg/dL (ref 65–99)
Potassium: 3 mmol/L — ABNORMAL LOW (ref 3.5–5.3)
Sodium: 142 mmol/L (ref 135–146)
Total Bilirubin: 0.3 mg/dL (ref 0.2–1.2)
Total Protein: 7.2 g/dL (ref 6.1–8.1)

## 2019-03-31 LAB — HEMOGLOBIN A1C
Hgb A1c MFr Bld: 6 % of total Hgb — ABNORMAL HIGH (ref <5.7)
Mean Plasma Glucose: 126 (calc)
eAG (mmol/L): 7 (calc)

## 2019-03-31 LAB — TEST AUTHORIZATION

## 2019-03-31 LAB — B. BURGDORFI ANTIBODIES: B burgdorferi Ab IgG+IgM: <0.9 index

## 2019-03-31 LAB — SEDIMENTATION RATE: Sed Rate: 39 mm/h — ABNORMAL HIGH (ref 0–30)

## 2019-03-31 LAB — ANA: Anti Nuclear Antibody (ANA): NEGATIVE

## 2019-03-31 LAB — HIV ANTIBODY (ROUTINE TESTING W REFLEX): HIV 1&2 Ab, 4th Generation: NONREACTIVE

## 2019-03-31 LAB — RPR: RPR Ser Ql: NONREACTIVE

## 2019-03-31 LAB — RHEUMATOID FACTOR: Rheumatoid fact SerPl-aCnc: <14 IU/mL (ref <14)

## 2019-03-31 LAB — C-REACTIVE PROTEIN: CRP: 41.7 mg/L — ABNORMAL HIGH (ref <8.0)

## 2019-03-31 LAB — IRON,TIBC AND FERRITIN PANEL
%SAT: 14 % (calc) — ABNORMAL LOW (ref 16–45)
Ferritin: 75 ng/mL (ref 16–232)
Iron: 41 ug/dL — ABNORMAL LOW (ref 45–160)
TIBC: 303 mcg/dL (calc) (ref 250–450)

## 2019-03-31 LAB — TSH+FREE T4: TSH W/REFLEX TO FT4: 1.6 mIU/L (ref 0.40–4.50)

## 2019-04-05 ENCOUNTER — Ambulatory Visit: Payer: Federal, State, Local not specified - PPO | Admitting: Rehabilitative and Restorative Service Providers"

## 2019-04-11 ENCOUNTER — Ambulatory Visit: Payer: Federal, State, Local not specified - PPO | Admitting: Rehabilitative and Restorative Service Providers"

## 2019-04-11 ENCOUNTER — Ambulatory Visit (INDEPENDENT_AMBULATORY_CARE_PROVIDER_SITE_OTHER): Payer: Federal, State, Local not specified - PPO | Admitting: Physician Assistant

## 2019-04-11 ENCOUNTER — Encounter: Payer: Self-pay | Admitting: Physician Assistant

## 2019-04-11 VITALS — BP 143/82 | HR 80 | Temp 98.2°F | Wt 184.0 lb

## 2019-04-11 DIAGNOSIS — R21 Rash and other nonspecific skin eruption: Secondary | ICD-10-CM | POA: Diagnosis not present

## 2019-04-11 DIAGNOSIS — L308 Other specified dermatitis: Secondary | ICD-10-CM | POA: Diagnosis not present

## 2019-04-11 DIAGNOSIS — K047 Periapical abscess without sinus: Secondary | ICD-10-CM | POA: Diagnosis not present

## 2019-04-11 DIAGNOSIS — E876 Hypokalemia: Secondary | ICD-10-CM

## 2019-04-11 DIAGNOSIS — R7982 Elevated C-reactive protein (CRP): Secondary | ICD-10-CM | POA: Diagnosis not present

## 2019-04-11 MED ORDER — TRIAMCINOLONE ACETONIDE 0.5 % EX OINT: 1.0000 "application " | TOPICAL_OINTMENT | Freq: Two times a day (BID) | CUTANEOUS | 0 refills | Status: DC

## 2019-04-11 MED ORDER — CLINDAMYCIN HCL 300 MG PO CAPS: 300.0000 mg | ORAL_CAPSULE | Freq: Three times a day (TID) | ORAL | 0 refills | Status: AC

## 2019-04-11 NOTE — Patient Instructions (Signed)
Skin Biopsy, Care After  This sheet gives you information about how to care for yourself after your procedure. Your health care provider may also give you more specific instructions. If you have problems or questions, contact your health care provider.  What can I expect after the procedure?  After the procedure, it is common to have:   Soreness.   Bruising.   Itching.  Follow these instructions at home:  Biopsy site care  Follow instructions from your health care provider about how to take care of your biopsy site. Make sure you:   Wash your hands with soap and water before and after you change your bandage (dressing). If soap and water are not available, use hand sanitizer.   Apply ointment on your biopsy site as directed by your health care provider.   Change your dressing as told by your health care provider.   Leave stitches (sutures), skin glue, or adhesive strips in place. These skin closures may need to stay in place for 2 weeks or longer. If adhesive strip edges start to loosen and curl up, you may trim the loose edges. Do not remove adhesive strips completely unless your health care provider tells you to do that.   If the biopsy area bleeds, apply gentle pressure for 10 minutes.  Check your biopsy site every day for signs of infection. Check for:   Redness, swelling, or pain.   Fluid or blood.   Warmth.   Pus or a bad smell.    General instructions   Rest and then return to your normal activities as told by your health care provider.   Take over-the-counter and prescription medicines only as told by your health care provider.   Keep all follow-up visits as told by your health care provider. This is important.  Contact a health care provider if:   You have redness, swelling, or pain around your biopsy site.   You have fluid or blood coming from your biopsy site.   Your biopsy site feels warm to the touch.   You have pus or a bad smell coming from your biopsy site.   You have a  fever.   Your sutures, skin glue, or adhesive strips loosen or come off sooner than expected.  Get help right away if:   You have bleeding that does not stop with pressure or a dressing.  Summary   After the procedure, it is common to have soreness, bruising, and itching at the site.   Follow instructions from your health care provider about how to take care of your biopsy site.   Check your biopsy site every day for signs of infection.   Contact a health care provider if you have redness, swelling, or pain around your biopsy site, or your biopsy site feels warm to the touch.   Keep all follow-up visits as told by your health care provider. This is important.  This information is not intended to replace advice given to you by your health care provider. Make sure you discuss any questions you have with your health care provider.  Document Released: 12/06/2015 Document Revised: 05/09/2018 Document Reviewed: 05/09/2018  [employer]vier Interactive Patient Education  2019 [employer]

## 2019-04-11 NOTE — Progress Notes (Signed)
HPI:                                                                Hailey Robinson is a 58 y.o. female who presents to [facility]: [place] today for follow-up fatigue  Patient presented to the office approximately 2 weeks ago with new onset intense fatigue and loss of energy as well as left arm weakness.  Symptoms were severe enough that it prompted her to come in for a visit and she was unable to go to work. Her physical exam at that time was benign. Her laboratory work-up was remarkable for elevated ESR and CRP as well as iron deficiency without anemia.  There is no other rheumatologic abnormalities including negative rheumatoid factor, ANA, Lyme antibodies.  She was also found to have a mild hypokalemia and was started on Klor-Con 20 mEq daily.  She was also found to have early prediabetes and counseled on dietary recommendations.  Patient presents today for follow-up.  She reports her energy is back to normal.  4 days ago she developed an itchy rash of her right elbow.  She self treated with hydrocortisone and Gold[place] and she states that this helped with itching.  She also reports for the last 4 days she has had pain in her gums in the area of her lower molars bilaterally.  She states she has a history of chronic dental infections and abscesses and has been told that her teeth need to be extracted.  However due to the COVID-19 pandemic she has been unable to schedule these elective teeth extractions.  She denies any fever, facial swelling, altered taste.  She has been treating with Tylenol and rinsing mouth with warm salt water.  Past Medical History:  Diagnosis Date  . Anemia   . Arthritis    "think I have some in my lower back" (02/10/2017)  . Chronic bronchitis ([place]) 03/29/2019  . Degenerative arthritis of cervical spine 03/29/2019  . Functional systolic murmur    normal ECHO 07/2016  . GERD (gastroesophageal reflux disease)    spicy foods   . Hypertension   . Left thyroid nodule 11/04/2016  . Pneumonia 10/2016  . Prediabetes 03/29/2019  . Seasonal allergies   . Unilateral vocal cord paralysis    Past Surgical History:  Procedure Laterality Date  . BIOPSY THYROID  12/2016 X 2  . LACERATION REPAIR Right 1985   pinky  . THYROID LOBECTOMY Left 02/10/2017  . THYROID LOBECTOMY Left 02/10/2017   Procedure: LEFT THYROID LOBECTOMY;  Surgeon: Ralene Ok, MD;  Location: [facility];  Service: General;  Laterality: Left;  . TUBAL LIGATION     Social History   Tobacco Use  . Smoking status: Current Every Day Smoker    Packs/day: 0.50    Years: 15.00    Pack years: 7.50    Types: Cigarettes  . Smokeless tobacco: Never Used  . Tobacco comment: As per pt - smokes 10 cigs a day  Substance Use Topics  . Alcohol use: Yes    Alcohol/week: 2.0 standard drinks    Types: 2 Cans of beer per week    Comment: "weekends only"   family history includes Hyperlipidemia in her father and mother; Hypertension in her father and mother;  Thyroid disease in her sister.    ROS: negative except as noted in the HPI  Medications: Current Outpatient Medications  Medication Sig Dispense Refill  . amLODipine (NORVASC) 5 MG tablet Take 1 tablet (5 mg total) by mouth daily. 90 tablet 1  . aspirin EC 81 MG tablet Take 1 tablet (81 mg total) by mouth daily. 90 tablet 3  . atorvastatin (LIPITOR) 10 MG tablet TAKE ONE TABLET BT MOUTH AT BEDTIME 90 tablet 1  . doxepin (SINEQUAN) 25 MG capsule Take 1 capsule (25 mg total) by mouth at bedtime as needed (sleep/anxiety). 30 capsule 0  . fluticasone (FLONASE) 50 MCG/ACT nasal spray Place 1 spray into both nostrils daily. 16 g 0  . hydrochlorothiazide (HYDRODIURIL) 25 MG tablet Take 1 tablet (25 mg total) by mouth every morning. 90 tablet 1  . levocetirizine (XYZAL ALLERGY 24HR) 5 MG tablet Take 1 tablet (5 mg total) by mouth every evening. 90 tablet 3  . potassium chloride SA (K-DUR) 20 MEQ tablet Take 1 tablet  (20 mEq total) by mouth daily. 90 tablet 1   No current facility-administered medications for this visit.    Allergies  Allergen Reactions  . No Known Allergies        Objective:  BP (!) 143/82   Pulse 80   Temp 98.2 F (36.8 C) (Oral)   Wt 184 lb (83.5 kg)   BMI 29.70 kg/m  Gen:  alert, not ill-appearing, no distress, appropriate for age 57: head normocephalic without obvious abnormality, conjunctiva and cornea clear, trachea midline Pulm: Normal work of breathing, normal phonation Neuro: alert and oriented x 3, no tremor MSK: extremities atraumatic, normal gait and station Skin extensor aspect of right elbow there is a 5.5 cm x 7.5 cm plaque that appears slightly red but mostly hyperpigmented    Recent Results (from the past 2160 hour(s))  CBC     Status: None   Collection Time: 02/27/19  9:59 AM  Result Value Ref Range   WBC 5.6 3.8 - 10.8 Thousand/uL   RBC 4.64 3.80 - 5.10 Million/uL   Hemoglobin 13.6 11.7 - 15.5 g/dL   HCT 40.5 35.0 - 45.0 %   MCV 87.3 80.0 - 100.0 fL   MCH 29.3 27.0 - 33.0 pg   MCHC 33.6 32.0 - 36.0 g/dL   RDW 13.3 11.0 - 15.0 %   Platelets 377 140 - 400 Thousand/uL   MPV 10.7 7.5 - 12.5 fL  COMPLETE METABOLIC PANEL WITH GFR     Status: None   Collection Time: 02/27/19  9:59 AM  Result Value Ref Range   Glucose, Bld 98 65 - 99 mg/dL    Comment: .            Fasting reference interval .    BUN 7 7 - 25 mg/dL   Creat 0.68 0.50 - 1.05 mg/dL    Comment: For patients >75 years of age, the reference limit for Creatinine is approximately 13% higher for people identified as African-American. .    GFR, Est Non African American 98 > OR = 60 mL/min/1.38m   GFR, Est African American 113 > OR = 60 mL/min/1.760m  BUN/Creatinine Ratio NOT APPLICABLE 6 - 22 (calc)   Sodium 141 135 - 146 mmol/L   Potassium 3.6 3.5 - 5.3 mmol/L   Chloride 100 98 - 110 mmol/L   CO2 30 20 - 32 mmol/L   Calcium 9.8 8.6 - 10.4 mg/dL   Total Protein 7.7 6.1 -  8.1  g/dL   Albumin 4.0 3.6 - 5.1 g/dL   Globulin 3.7 1.9 - 3.7 g/dL (calc)   AG Ratio 1.1 1.0 - 2.5 (calc)   Total Bilirubin 0.3 0.2 - 1.2 mg/dL   Alkaline phosphatase (APISO) 78 37 - 153 U/L   AST 17 10 - 35 U/L   ALT 16 6 - 29 U/L  CBC with Differential/Platelet     Status: None   Collection Time: 03/28/19  3:14 PM  Result Value Ref Range   WBC 4.5 3.8 - 10.8 Thousand/uL   RBC 4.49 3.80 - 5.10 Million/uL   Hemoglobin 12.8 11.7 - 15.5 g/dL   HCT 38.5 35.0 - 45.0 %   MCV 85.7 80.0 - 100.0 fL   MCH 28.5 27.0 - 33.0 pg   MCHC 33.2 32.0 - 36.0 g/dL   RDW 14.3 11.0 - 15.0 %   Platelets 235 140 - 400 Thousand/uL   MPV 10.7 7.5 - 12.5 fL   Neutro Abs 2,457 1,500 - 7,800 cells/uL   Lymphs Abs 1,431 850 - 3,900 cells/uL   Absolute Monocytes 333 200 - 950 cells/uL   Eosinophils Absolute 261 15 - 500 cells/uL   Basophils Absolute 18 0 - 200 cells/uL   Neutrophils Relative % 54.6 %   Total Lymphocyte 31.8 %   Monocytes Relative 7.4 %   Eosinophils Relative 5.8 %   Basophils Relative 0.4 %  TSH + free T4     Status: None   Collection Time: 03/28/19  3:14 PM  Result Value Ref Range   TSH W/REFLEX TO FT4 1.60 0.40 - 4.50 mIU/L  COMPLETE METABOLIC PANEL WITH GFR     Status: Abnormal   Collection Time: 03/28/19  3:14 PM  Result Value Ref Range   Glucose, Bld 92 65 - 99 mg/dL    Comment: .            Fasting reference interval .    BUN 9 7 - 25 mg/dL   Creat 0.53 0.50 - 1.05 mg/dL    Comment: For patients >75 years of age, the reference limit for Creatinine is approximately 13% higher for people identified as African-American. .    GFR, Est Non African American 105 > OR = 60 mL/min/1.34m   GFR, Est African American 122 > OR = 60 mL/min/1.768m  BUN/Creatinine Ratio NOT APPLICABLE 6 - 22 (calc)   Sodium 142 135 - 146 mmol/L   Potassium 3.0 (L) 3.5 - 5.3 mmol/L   Chloride 104 98 - 110 mmol/L   CO2 30 20 - 32 mmol/L   Calcium 9.4 8.6 - 10.4 mg/dL   Total Protein 7.2 6.1 - 8.1 g/dL    Albumin 4.3 3.6 - 5.1 g/dL   Globulin 2.9 1.9 - 3.7 g/dL (calc)   AG Ratio 1.5 1.0 - 2.5 (calc)   Total Bilirubin 0.3 0.2 - 1.2 mg/dL   Alkaline phosphatase (APISO) 69 37 - 153 U/L   AST 14 10 - 35 U/L   ALT 11 6 - 29 U/L  Fe+TIBC+Fer     Status: Abnormal   Collection Time: 03/28/19  3:14 PM  Result Value Ref Range   Iron 41 (L) 45 - 160 mcg/dL   TIBC 303 250 - 450 mcg/dL (calc)   %SAT 14 (L) 16 - 45 % (calc)   Ferritin 75 16 - 232 ng/mL  Hemoglobin A1c     Status: Abnormal   Collection Time: 03/28/19  3:14 PM  Result Value Ref  Range   Hgb A1c MFr Bld 6.0 (H) <5.7 % of total Hgb    Comment: For someone without known diabetes, a hemoglobin  A1c value between 5.7% and 6.4% is consistent with prediabetes and should be confirmed with a  follow-up test. . For someone with known diabetes, a value <7% indicates that their diabetes is well controlled. A1c targets should be individualized based on duration of diabetes, age, comorbid conditions, and other considerations. . This assay result is consistent with an increased risk of diabetes. . Currently, no consensus exists regarding use of hemoglobin A1c for diagnosis of diabetes for children. .    Mean Plasma Glucose 126 (calc)   eAG (mmol/L) 7.0 (calc)  C-reactive protein     Status: Abnormal   Collection Time: 03/28/19  3:14 PM  Result Value Ref Range   CRP 41.7 (H) <8.0 mg/L  Sedimentation rate     Status: Abnormal   Collection Time: 03/28/19  3:14 PM  Result Value Ref Range   Sed Rate 39 (H) 0 - 30 mm/h  RPR     Status: None   Collection Time: 03/28/19  3:14 PM  Result Value Ref Range   RPR Ser Ql NON-REACTIVE NON-REACTI  HIV antibody (with reflex)     Status: None   Collection Time: 03/28/19  3:14 PM  Result Value Ref Range   HIV 1&2 Ab, 4th Generation NON-REACTIVE NON-REACTI    Comment: HIV-1 antigen and HIV-1/HIV-2 antibodies were not detected. There is no laboratory evidence of HIV infection. Marland Kitchen PLEASE NOTE:  This information has been disclosed to you from records whose confidentiality may be protected by state law.  If your state requires such protection, then the state law prohibits you from making any further disclosure of the information without the specific written consent of the person to whom it pertains, or as otherwise permitted by law. A general authorization for the release of medical or other information is NOT sufficient for this purpose. . For additional information please refer to http://education.questdiagnostics.com/faq/FAQ106 (This link is being provided for informational/ educational purposes only.) . Marland Kitchen The performance of this assay has not been clinically validated in patients less than 22 years old. Evonnie Pat antibodies     Status: None   Collection Time: 03/28/19  3:14 PM  Result Value Ref Range   B burgdorferi Ab IgG+IgM <0.90 index    Comment:                    Index                Interpretation                    -----                --------------                    < 0.90               Negative                    0.90-1.09            Equivocal                    > 1.09               Positive . As recommended by the Food and Drug Administration  (FDA),  all samples with positive or equivocal  results in a Borrelia burgdorferi antibody screen will be tested using a blot method. Positive or  equivocal screening test results should not be  interpreted as truly positive until verified as such  using a supplemental assay (e.g., B. burgdorferi blot). . The screening test and/or blot for B. burgdorferi  antibodies may be falsely negative in early stages of Lyme disease, including the period when erythema  migrans is apparent. .   ANA     Status: None   Collection Time: 03/28/19  3:14 PM  Result Value Ref Range   Anti Nuclear Antibody (ANA) NEGATIVE NEGATIVE    Comment: ANA IFA is a first line screen for detecting the presence of up to  approximately 150 autoantibodies in various autoimmune diseases. A negative ANA IFA result suggests an ANA-associated autoimmune disease is not present at this time, but is not definitive. If there is high clinical suspicion for Sjogren's syndrome, testing for anti-SS-A/Ro antibody should be considered. Anti-Jo-1 antibody should be considered for clinically suspected inflammatory myopathies. . AC-0: Negative . International Consensus on ANA Patterns (https://www.hernandez-brewer.com/) . For additional information, please refer to http://education.[facility].com/faq/FAQ177 (This link is being provided for informational/ educational purposes only.) .   Rheumatoid factor     Status: None   Collection Time: 03/28/19  3:14 PM  Result Value Ref Range   Rhuematoid fact SerPl-aCnc <14 <14 IU/mL  TEST AUTHORIZATION     Status: None   Collection Time: 03/28/19  3:14 PM  Result Value Ref Range   TEST NAME: ANA SCREEN, IFA, W/REFL RHEUM    TEST CODE: [id number]    CLIENT CONTACT: Towana Badger    REPORT ALWAYS MESSAGE SIGNATURE      Comment: . The laboratory testing on this patient was verbally requested or confirmed by the ordering physician or his or her authorized representative after contact with an employee of [employer]. Federal regulations require that we maintain on file written authorization for all laboratory testing.  Accordingly we are asking that the ordering physician or his or her authorized representative sign a copy of this report and promptly return it to the client service representative. . . Signature:____________________________________________________ . Please fax this signed page to [phone] or return it via your [employer] courier.   POCT Urinalysis Dipstick     Status: Abnormal   Collection Time: 03/28/19  3:20 PM  Result Value Ref Range   Color, UA yellow    Clarity, UA clear    Glucose, UA Negative Negative    Bilirubin, UA negative    Ketones, UA negative    Spec Grav, UA <=1.005 (A) 1.010 - 1.025   Blood, UA trace    pH, UA 6.0 5.0 - 8.0   Protein, UA Positive (A) Negative   Urobilinogen, UA 0.2 0.2 or 1.0 E.U./dL   Nitrite, UA negative    Leukocytes, UA Negative Negative   Appearance     Odor    Urine Microscopic     Status: None   Collection Time: 03/28/19  3:29 PM  Result Value Ref Range   WBC, UA 0-5 0 - 5 /HPF   RBC / HPF 0-2 0 - 2 /HPF   Squamous Epithelial / LPF 0-5 < OR = 5 /HPF   Bacteria, UA NONE SEEN NONE SEEN /HPF   Hyaline Cast NONE SEEN NONE SEEN /LPF  Sedimentation rate     Status: Abnormal   Collection Time: 04/11/19  3:59 PM  Result Value Ref Range   Sed Rate 39 (H) 0 - 30 mm/h  C-reactive protein     Status: Abnormal   Collection Time: 04/11/19  3:59 PM  Result Value Ref Range   CRP 30.5 (H) <8.0 mg/L  BASIC METABOLIC PANEL WITH GFR     Status: None   Collection Time: 04/11/19  3:59 PM  Result Value Ref Range   Glucose, Bld 79 65 - 99 mg/dL    Comment: .            Fasting reference interval .    BUN 8 7 - 25 mg/dL   Creat 0.63 0.50 - 1.05 mg/dL    Comment: For patients >35 years of age, the reference limit for Creatinine is approximately 13% higher for people identified as African-American. .    GFR, Est Non African American 100 > OR = 60 mL/min/1.44m   GFR, Est African American 115 > OR = 60 mL/min/1.74m  BUN/Creatinine Ratio NOT APPLICABLE 6 - 22 (calc)   Sodium 140 135 - 146 mmol/L   Potassium 3.7 3.5 - 5.3 mmol/L   Chloride 102 98 - 110 mmol/L   CO2 24 20 - 32 mmol/L   Calcium 10.3 8.6 - 10.4 mg/dL     No results found for this or any previous visit (from the past 72 hour(s)). No results found.    Assessment and Plan: [age].o. female with   .Somya was seen today for follow-up.  Diagnoses and all orders for this visit:  Hypokalemia due to excessive renal loss of potassium -     BASIC METABOLIC PANEL WITH GFR  Elevated C-reactive  protein (CRP) -     Sedimentation rate -     C-reactive protein  Rash and nonspecific skin eruption -     Dermatology pathology -     triamcinolone ointment (KENALOG) 0.5 %; Apply 1 application topically 2 (two) times daily for 14 days. To affected area, avoid eyes and face  Chronic dental infection -     clindamycin (CLEOCIN) 300 MG capsule; Take 1 capsule (300 mg total) by mouth 3 (three) times daily for 7 days.   Given patient's history of fatigue, elevated inflammatory markers, new onset upper extremity weakness and new onset rash I am concerned about the possibility of dermatomyositis Punch biopsy obtained of the rash on the extensor aspect of the right elbow (see procedure note) Rechecking ESR and CRP to trend  Rechecking potassium today for mild hypokalemia likely due to thiazide diuretic  I have given her a written prescription for clindamycin to fill if she develops signs of acute dental infection including fever, facial swelling, worsening pain.  She was instructed to contact her dentist if she needs to begin the clindamycin    Patient education and anticipatory guidance given Patient agrees with treatment plan Follow-up in 10 days for suture removal or sooner as needed if symptoms worsen or fail to improve  ChDarlyne RussianA-C

## 2019-04-12 LAB — BASIC METABOLIC PANEL WITH GFR
BUN: 8 mg/dL (ref 7–25)
CO2: 24 mmol/L (ref 20–32)
Calcium: 10.3 mg/dL (ref 8.6–10.4)
Chloride: 102 mmol/L (ref 98–110)
Creat: 0.63 mg/dL (ref 0.50–1.05)
GFR, Est African American: 115 mL/min/{1.73_m2} (ref >=60)
GFR, Est Non African American: 100 mL/min/{1.73_m2} (ref >=60)
Glucose, Bld: 79 mg/dL (ref 65–99)
Potassium: 3.7 mmol/L (ref 3.5–5.3)
Sodium: 140 mmol/L (ref 135–146)

## 2019-04-12 LAB — SEDIMENTATION RATE: Sed Rate: 39 mm/h — ABNORMAL HIGH (ref 0–30)

## 2019-04-12 LAB — C-REACTIVE PROTEIN: CRP: 30.5 mg/L — ABNORMAL HIGH (ref <8.0)

## 2019-04-14 ENCOUNTER — Encounter: Payer: Self-pay | Admitting: Physician Assistant

## 2019-04-14 NOTE — Progress Notes (Signed)
Punch Biopsy Procedure Note  Pre-operative Diagnosis: Rash, suspect dermatitis  Post-operative Diagnosis: Same  Locations: Extensor aspect of right elbow  Indications: Elevated ESR/CRP, fatigue, confirm diagnosis  Anesthesia: 1-1/2 cc lidocaine 1% with epinephrine  Procedure Details   Patient informed of the risks (including bleeding and infection) and benefits of the  procedure and Verbal informed consent obtained.  The lesion and surrounding area was given a sterile prep using chlorhexidine and draped in the usual sterile fashion. The skin was then stretched perpendicular to the skin tension lines and the lesion removed using the 16m punch. The resulting ellipse was then closed. The wound was closed with 1, 4-0 Prolene simple interrupted suture. Antibiotic ointment and a sterile dressing applied. The specimen was sent for pathologic examination. The patient tolerated the procedure well.  Condition: Stable  Complications: none.  Plan: 1. Instructed to keep the wound dry and covered for 24-48h and clean thereafter. 2. Warning signs of infection were reviewed.   3. Recommended that the patient use OTC analgesics as needed for pain.  4. Return for suture removal in 10 days.

## 2019-04-18 ENCOUNTER — Other Ambulatory Visit: Payer: Self-pay

## 2019-04-18 DIAGNOSIS — G479 Sleep disorder, unspecified: Secondary | ICD-10-CM

## 2019-04-18 DIAGNOSIS — F430 Acute stress reaction: Secondary | ICD-10-CM

## 2019-04-18 MED ORDER — DOXEPIN HCL 25 MG PO CAPS: 25.0000 mg | ORAL_CAPSULE | Freq: Every evening | ORAL | 0 refills | Status: DC | PRN

## 2019-04-19 DIAGNOSIS — K5730 Diverticulosis of large intestine without perforation or abscess without bleeding: Secondary | ICD-10-CM | POA: Diagnosis not present

## 2019-04-19 DIAGNOSIS — K6389 Other specified diseases of intestine: Secondary | ICD-10-CM | POA: Diagnosis not present

## 2019-04-19 DIAGNOSIS — R933 Abnormal findings on diagnostic imaging of other parts of digestive tract: Secondary | ICD-10-CM | POA: Diagnosis not present

## 2019-04-21 ENCOUNTER — Encounter: Payer: Self-pay | Admitting: Physician Assistant

## 2019-04-21 ENCOUNTER — Ambulatory Visit: Payer: Federal, State, Local not specified - PPO | Admitting: Physician Assistant

## 2019-04-21 VITALS — BP 124/74 | HR 90 | Wt 184.0 lb

## 2019-04-21 DIAGNOSIS — Z4802 Encounter for removal of sutures: Secondary | ICD-10-CM

## 2019-04-21 DIAGNOSIS — L308 Other specified dermatitis: Secondary | ICD-10-CM

## 2019-04-21 NOTE — Progress Notes (Signed)
Subjective:    Anahita Cua is a 58 y.o. female who underwent punch biopsy of right elbow on 04/11/19. She denies pain, redness, or drainage from the wound.  The following portions of the patient's history were reviewed and updated as appropriate: allergies, current medications, past family history, past medical history, past social history, past surgical history and problem list.  Interestingly biopsy showed a perivascular dermatitis with eosinophils. Pathologist's differential included drug eruption, lyme and syphilis. Her B. burgodorfi antibodies and RPR were negative. Potential offending drugs include Amlodipine and HCTZ. Itching and rash have improved with topical triamcinolone. Rash has not spread and there are no additional skin lesions.   Her fatigue has resolved. Her CRP is trending down. Her left arm weakness has also resolved.  Review of Systems Pertinent items are noted in HPI.    Objective:    BP 124/74   Pulse 90   Wt 184 lb (83.5 kg)   BMI 29.70 kg/m  Extensor aspect right elbow:  Biopsy site is healing well without evidence of infection.    Recent Results (from the past 2160 hour(s))  CBC     Status: None   Collection Time: 02/27/19  9:59 AM  Result Value Ref Range   WBC 5.6 3.8 - 10.8 Thousand/uL   RBC 4.64 3.80 - 5.10 Million/uL   Hemoglobin 13.6 11.7 - 15.5 g/dL   HCT 40.5 35.0 - 45.0 %   MCV 87.3 80.0 - 100.0 fL   MCH 29.3 27.0 - 33.0 pg   MCHC 33.6 32.0 - 36.0 g/dL   RDW 13.3 11.0 - 15.0 %   Platelets 377 140 - 400 Thousand/uL   MPV 10.7 7.5 - 12.5 fL  COMPLETE METABOLIC PANEL WITH GFR     Status: None   Collection Time: 02/27/19  9:59 AM  Result Value Ref Range   Glucose, Bld 98 65 - 99 mg/dL    Comment: .            Fasting reference interval .    BUN 7 7 - 25 mg/dL   Creat 0.68 0.50 - 1.05 mg/dL    Comment: For patients >31 years of age, the reference limit for Creatinine is approximately 13% higher for people identified as  African-American. .    GFR, Est Non African American 98 > OR = 60 mL/min/1.67m   GFR, Est African American 113 > OR = 60 mL/min/1.715m  BUN/Creatinine Ratio NOT APPLICABLE 6 - 22 (calc)   Sodium 141 135 - 146 mmol/L   Potassium 3.6 3.5 - 5.3 mmol/L   Chloride 100 98 - 110 mmol/L   CO2 30 20 - 32 mmol/L   Calcium 9.8 8.6 - 10.4 mg/dL   Total Protein 7.7 6.1 - 8.1 g/dL   Albumin 4.0 3.6 - 5.1 g/dL   Globulin 3.7 1.9 - 3.7 g/dL (calc)   AG Ratio 1.1 1.0 - 2.5 (calc)   Total Bilirubin 0.3 0.2 - 1.2 mg/dL   Alkaline phosphatase (APISO) 78 37 - 153 U/L   AST 17 10 - 35 U/L   ALT 16 6 - 29 U/L  CBC with Differential/Platelet     Status: None   Collection Time: 03/28/19  3:14 PM  Result Value Ref Range   WBC 4.5 3.8 - 10.8 Thousand/uL   RBC 4.49 3.80 - 5.10 Million/uL   Hemoglobin 12.8 11.7 - 15.5 g/dL   HCT 38.5 35.0 - 45.0 %   MCV 85.7 80.0 - 100.0 fL   MCH  28.5 27.0 - 33.0 pg   MCHC 33.2 32.0 - 36.0 g/dL   RDW 14.3 11.0 - 15.0 %   Platelets 235 140 - 400 Thousand/uL   MPV 10.7 7.5 - 12.5 fL   Neutro Abs 2,457 1,500 - 7,800 cells/uL   Lymphs Abs 1,431 850 - 3,900 cells/uL   Absolute Monocytes 333 200 - 950 cells/uL   Eosinophils Absolute 261 15 - 500 cells/uL   Basophils Absolute 18 0 - 200 cells/uL   Neutrophils Relative % 54.6 %   Total Lymphocyte 31.8 %   Monocytes Relative 7.4 %   Eosinophils Relative 5.8 %   Basophils Relative 0.4 %  TSH + free T4     Status: None   Collection Time: 03/28/19  3:14 PM  Result Value Ref Range   TSH W/REFLEX TO FT4 1.60 0.40 - 4.50 mIU/L  COMPLETE METABOLIC PANEL WITH GFR     Status: Abnormal   Collection Time: 03/28/19  3:14 PM  Result Value Ref Range   Glucose, Bld 92 65 - 99 mg/dL    Comment: .            Fasting reference interval .    BUN 9 7 - 25 mg/dL   Creat 0.53 0.50 - 1.05 mg/dL    Comment: For patients >79 years of age, the reference limit for Creatinine is approximately 13% higher for people identified as  African-American. .    GFR, Est Non African American 105 > OR = 60 mL/min/1.26m   GFR, Est African American 122 > OR = 60 mL/min/1.72m  BUN/Creatinine Ratio NOT APPLICABLE 6 - 22 (calc)   Sodium 142 135 - 146 mmol/L   Potassium 3.0 (L) 3.5 - 5.3 mmol/L   Chloride 104 98 - 110 mmol/L   CO2 30 20 - 32 mmol/L   Calcium 9.4 8.6 - 10.4 mg/dL   Total Protein 7.2 6.1 - 8.1 g/dL   Albumin 4.3 3.6 - 5.1 g/dL   Globulin 2.9 1.9 - 3.7 g/dL (calc)   AG Ratio 1.5 1.0 - 2.5 (calc)   Total Bilirubin 0.3 0.2 - 1.2 mg/dL   Alkaline phosphatase (APISO) 69 37 - 153 U/L   AST 14 10 - 35 U/L   ALT 11 6 - 29 U/L  Fe+TIBC+Fer     Status: Abnormal   Collection Time: 03/28/19  3:14 PM  Result Value Ref Range   Iron 41 (L) 45 - 160 mcg/dL   TIBC 303 250 - 450 mcg/dL (calc)   %SAT 14 (L) 16 - 45 % (calc)   Ferritin 75 16 - 232 ng/mL  Hemoglobin A1c     Status: Abnormal   Collection Time: 03/28/19  3:14 PM  Result Value Ref Range   Hgb A1c MFr Bld 6.0 (H) <5.7 % of total Hgb    Comment: For someone without known diabetes, a hemoglobin  A1c value between 5.7% and 6.4% is consistent with prediabetes and should be confirmed with a  follow-up test. . For someone with known diabetes, a value <7% indicates that their diabetes is well controlled. A1c targets should be individualized based on duration of diabetes, age, comorbid conditions, and other considerations. . This assay result is consistent with an increased risk of diabetes. . Currently, no consensus exists regarding use of hemoglobin A1c for diagnosis of diabetes for children. .    Mean Plasma Glucose 126 (calc)   eAG (mmol/L) 7.0 (calc)  C-reactive protein     Status: Abnormal  Collection Time: 03/28/19  3:14 PM  Result Value Ref Range   CRP 41.7 (H) <8.0 mg/L  Sedimentation rate     Status: Abnormal   Collection Time: 03/28/19  3:14 PM  Result Value Ref Range   Sed Rate 39 (H) 0 - 30 mm/h  RPR     Status: None   Collection  Time: 03/28/19  3:14 PM  Result Value Ref Range   RPR Ser Ql NON-REACTIVE NON-REACTI  HIV antibody (with reflex)     Status: None   Collection Time: 03/28/19  3:14 PM  Result Value Ref Range   HIV 1&2 Ab, 4th Generation NON-REACTIVE NON-REACTI    Comment: HIV-1 antigen and HIV-1/HIV-2 antibodies were not detected. There is no laboratory evidence of HIV infection. Marland Kitchen PLEASE NOTE: This information has been disclosed to you from records whose confidentiality may be protected by state law.  If your state requires such protection, then the state law prohibits you from making any further disclosure of the information without the specific written consent of the person to whom it pertains, or as otherwise permitted by law. A general authorization for the release of medical or other information is NOT sufficient for this purpose. . For additional information please refer to http://education.questdiagnostics.com/faq/FAQ106 (This link is being provided for informational/ educational purposes only.) . Marland Kitchen The performance of this assay has not been clinically validated in patients less than 42 years old. Evonnie Pat antibodies     Status: None   Collection Time: 03/28/19  3:14 PM  Result Value Ref Range   B burgdorferi Ab IgG+IgM <0.90 index    Comment:                    Index                Interpretation                    -----                --------------                    < 0.90               Negative                    0.90-1.09            Equivocal                    > 1.09               Positive . As recommended by the Food and Drug Administration  (FDA), all samples with positive or equivocal  results in a Borrelia burgdorferi antibody screen will be tested using a blot method. Positive or  equivocal screening test results should not be  interpreted as truly positive until verified as such  using a supplemental assay (e.g., B. burgdorferi blot). . The screening test  and/or blot for B. burgdorferi  antibodies may be falsely negative in early stages of Lyme disease, including the period when erythema  migrans is apparent. .   ANA     Status: None   Collection Time: 03/28/19  3:14 PM  Result Value Ref Range   Anti Nuclear Antibody (ANA) NEGATIVE NEGATIVE    Comment: ANA IFA is a first line screen for detecting the presence of up to approximately 150  autoantibodies in various autoimmune diseases. A negative ANA IFA result suggests an ANA-associated autoimmune disease is not present at this time, but is not definitive. If there is high clinical suspicion for Sjogren's syndrome, testing for anti-SS-A/Ro antibody should be considered. Anti-Jo-1 antibody should be considered for clinically suspected inflammatory myopathies. . AC-0: Negative . International Consensus on ANA Patterns (https://www.hernandez-brewer.com/) . For additional information, please refer to http://education.[facility].com/faq/FAQ177 (This link is being provided for informational/ educational purposes only.) .   Rheumatoid factor     Status: None   Collection Time: 03/28/19  3:14 PM  Result Value Ref Range   Rhuematoid fact SerPl-aCnc <14 <14 IU/mL  TEST AUTHORIZATION     Status: None   Collection Time: 03/28/19  3:14 PM  Result Value Ref Range   TEST NAME: ANA SCREEN, IFA, W/REFL RHEUM    TEST CODE: [id number]    CLIENT CONTACT: Towana Badger    REPORT ALWAYS MESSAGE SIGNATURE      Comment: . The laboratory testing on this patient was verbally requested or confirmed by the ordering physician or his or her authorized representative after contact with an employee of [employer]. Federal regulations require that we maintain on file written authorization for all laboratory testing.  Accordingly we are asking that the ordering physician or his or her authorized representative sign a copy of this report and promptly return it to the  client service representative. . . Signature:____________________________________________________ . Please fax this signed page to [phone] or return it via your [employer] courier.   POCT Urinalysis Dipstick     Status: Abnormal   Collection Time: 03/28/19  3:20 PM  Result Value Ref Range   Color, UA yellow    Clarity, UA clear    Glucose, UA Negative Negative   Bilirubin, UA negative    Ketones, UA negative    Spec Grav, UA <=1.005 (A) 1.010 - 1.025   Blood, UA trace    pH, UA 6.0 5.0 - 8.0   Protein, UA Positive (A) Negative   Urobilinogen, UA 0.2 0.2 or 1.0 E.U./dL   Nitrite, UA negative    Leukocytes, UA Negative Negative   Appearance     Odor    Urine Microscopic     Status: None   Collection Time: 03/28/19  3:29 PM  Result Value Ref Range   WBC, UA 0-5 0 - 5 /HPF   RBC / HPF 0-2 0 - 2 /HPF   Squamous Epithelial / LPF 0-5 < OR = 5 /HPF   Bacteria, UA NONE SEEN NONE SEEN /HPF   Hyaline Cast NONE SEEN NONE SEEN /LPF  Sedimentation rate     Status: Abnormal   Collection Time: 04/11/19  3:59 PM  Result Value Ref Range   Sed Rate 39 (H) 0 - 30 mm/h  C-reactive protein     Status: Abnormal   Collection Time: 04/11/19  3:59 PM  Result Value Ref Range   CRP 30.5 (H) <8.0 mg/L  BASIC METABOLIC PANEL WITH GFR     Status: None   Collection Time: 04/11/19  3:59 PM  Result Value Ref Range   Glucose, Bld 79 65 - 99 mg/dL    Comment: .            Fasting reference interval .    BUN 8 7 - 25 mg/dL   Creat 0.63 0.50 - 1.05 mg/dL    Comment: For patients >54 years of age, the reference limit for Creatinine is approximately  13% higher for people identified as African-American. .    GFR, Est Non African American 100 > OR = 60 mL/min/1.72m   GFR, Est African American 115 > OR = 60 mL/min/1.744m  BUN/Creatinine Ratio NOT APPLICABLE 6 - 22 (calc)   Sodium 140 135 - 146 mmol/L   Potassium 3.7 3.5 - 5.3 mmol/L   Chloride 102 98 - 110 mmol/L   CO2 24 20 - 32  mmol/L   Calcium 10.3 8.6 - 10.4 mg/dL      Assessment:     Plan:   .Katti was seen today for suture / staple removal.  Diagnoses and all orders for this visit:  Superficial perivascular dermatitis -     Ambulatory referral to Dermatology      Unclear etiology of this rash. Drug eruption is possible, but unusual given extremely localized distribution of rash and improvement in rash in absence of discontinuing potentially offending agent. Lyme and Syphilis ruled out. Rheumatologic work-up unremarkable apart from raised ESR/CRP. Resolution of other systemic symptoms including fatigue and left arm weakness. Referring to dermatology for second opinion   1 suture were removed. Wound care discussed. Follow up as needed.    Patient education and anticipatory guidance given Patient agrees with treatment plan Follow-up as needed if symptoms worsen or fail to improve  ChDarlyne RussianA-C

## 2019-04-25 ENCOUNTER — Other Ambulatory Visit: Payer: Self-pay

## 2019-04-25 DIAGNOSIS — Z9189 Other specified personal risk factors, not elsewhere classified: Secondary | ICD-10-CM

## 2019-04-25 DIAGNOSIS — E785 Hyperlipidemia, unspecified: Secondary | ICD-10-CM

## 2019-04-25 MED ORDER — ATORVASTATIN CALCIUM 10 MG PO TABS: 10.0000 mg | ORAL_TABLET | Freq: Every day | ORAL | 0 refills | Status: DC

## 2019-05-15 ENCOUNTER — Other Ambulatory Visit: Payer: Self-pay

## 2019-05-15 DIAGNOSIS — F430 Acute stress reaction: Secondary | ICD-10-CM

## 2019-05-15 DIAGNOSIS — G479 Sleep disorder, unspecified: Secondary | ICD-10-CM

## 2019-05-15 MED ORDER — DOXEPIN HCL 25 MG PO CAPS: 25.0000 mg | ORAL_CAPSULE | Freq: Every evening | ORAL | 0 refills | Status: DC | PRN

## 2019-06-23 ENCOUNTER — Telehealth: Payer: Self-pay

## 2019-06-23 NOTE — Telephone Encounter (Signed)
Pt left vm stating that her job sent her for covid testing due to a co-worker testing positive.  She said that her employer told her to inform us just in case she need meds if test is positive. -EH/RMA

## 2019-06-23 NOTE — Telephone Encounter (Signed)
There are no medications for COVID-19 infection. Treatment is supportive with fever reducers, cough suppression, hydration. But I would like her to notify us if she is positive

## 2019-06-30 ENCOUNTER — Ambulatory Visit (INDEPENDENT_AMBULATORY_CARE_PROVIDER_SITE_OTHER): Payer: Federal, State, Local not specified - PPO | Admitting: Physician Assistant

## 2019-06-30 ENCOUNTER — Encounter: Payer: Self-pay | Admitting: Physician Assistant

## 2019-06-30 DIAGNOSIS — R040 Epistaxis: Secondary | ICD-10-CM

## 2019-06-30 DIAGNOSIS — B9689 Other specified bacterial agents as the cause of diseases classified elsewhere: Secondary | ICD-10-CM

## 2019-06-30 DIAGNOSIS — J019 Acute sinusitis, unspecified: Secondary | ICD-10-CM

## 2019-06-30 MED ORDER — DOXYCYCLINE HYCLATE 100 MG PO TABS: 100.0000 mg | ORAL_TABLET | Freq: Two times a day (BID) | ORAL | 0 refills | Status: AC

## 2019-06-30 NOTE — Progress Notes (Signed)
Virtual Visit via Video Note  I connected with Hailey Robinson on 06/30/19 at  9:50 AM EDT by a video enabled telemedicine application and verified that I am speaking with the correct person using two identifiers.   I discussed the limitations of evaluation and management by telemedicine and the availability of in person appointments. The patient expressed understanding and agreed to proceed.  History of Present Illness: HPI:                                                                Hailey Robinson is a 58 y.o. female   CC: sinusitis  Patient reports 1 month of right-sided maxillary sinus pressure, frontal headaches and right-sided nasal congestion.  She has had some intermittent nosebleeds with forceful blowing.  She had some dizziness last week, which resolved. Denies fever, chills, malaise, cough, SOB. She has tried treating with over-the-counter Flonase and antihistamine. Has been taking Tylenol and OTC migraine medication for the headaches. She states she was exposed to COVID-19 has been tested for COVID-19 and tested negative on June 27, 2019 at the [place]. She is requesting a note to return to work.  Past Medical History:  Diagnosis Date  . Anemia   . Arthritis    "think I have some in my lower back" (02/10/2017)  . Chronic bronchitis ([place]) 03/29/2019  . Degenerative arthritis of cervical spine 03/29/2019  . Functional systolic murmur    normal ECHO 07/2016  . GERD (gastroesophageal reflux disease)    spicy foods  . Hypertension   . Left thyroid nodule 11/04/2016  . Pneumonia 10/2016  . Prediabetes 03/29/2019  . Seasonal allergies   . Unilateral vocal cord paralysis    Past Surgical History:  Procedure Laterality Date  . BIOPSY THYROID  12/2016 X 2  . LACERATION REPAIR Right 1985   pinky  . THYROID LOBECTOMY Left 02/10/2017  . THYROID LOBECTOMY Left 02/10/2017   Procedure: LEFT THYROID LOBECTOMY;  Surgeon: Ralene Ok, MD;  Location: [facility];  Service: General;   Laterality: Left;  . TUBAL LIGATION     Social History   Tobacco Use  . Smoking status: Current Every Day Smoker    Packs/day: 0.50    Years: 15.00    Pack years: 7.50    Types: Cigarettes  . Smokeless tobacco: Never Used  . Tobacco comment: As per pt - smokes 10 cigs a day  Substance Use Topics  . Alcohol use: Yes    Alcohol/week: 2.0 standard drinks    Types: 2 Cans of beer per week    Comment: "weekends only"   family history includes Hyperlipidemia in her father and mother; Hypertension in her father and mother; Thyroid disease in her sister.    ROS: negative except as noted in the HPI  Medications: Current Outpatient Medications  Medication Sig Dispense Refill  . amLODipine (NORVASC) 5 MG tablet Take 1 tablet (5 mg total) by mouth daily. 90 tablet 1  . aspirin EC 81 MG tablet Take 1 tablet (81 mg total) by mouth daily. 90 tablet 3  . atorvastatin (LIPITOR) 10 MG tablet Take 1 tablet (10 mg total) by mouth daily. Due for fasting labs 30 tablet 0  . doxepin (SINEQUAN) 25 MG capsule Take 1 capsule (25 mg total) by  mouth at bedtime as needed (sleep/anxiety). 90 capsule 0  . fluticasone (FLONASE) 50 MCG/ACT nasal spray Place 1 spray into both nostrils daily. 16 g 0  . hydrochlorothiazide (HYDRODIURIL) 25 MG tablet Take 1 tablet (25 mg total) by mouth every morning. 90 tablet 1  . levocetirizine (XYZAL ALLERGY 24HR) 5 MG tablet Take 1 tablet (5 mg total) by mouth every evening. 90 tablet 3  . potassium chloride SA (K-DUR) 20 MEQ tablet Take 1 tablet (20 mEq total) by mouth daily. 90 tablet 1  . doxycycline (VIBRA-TABS) 100 MG tablet Take 1 tablet (100 mg total) by mouth 2 (two) times daily for 7 days. 14 tablet 0   No current facility-administered medications for this visit.    No Known Allergies     Objective:  There were no vitals taken for this visit.  Wt Readings from Last 3 Encounters:  04/21/19 184 lb (83.5 kg)  04/11/19 184 lb (83.5 kg)  03/28/19 183 lb (83 kg)    Temp Readings from Last 3 Encounters:  04/11/19 98.2 F (36.8 C) (Oral)  03/28/19 97.9 F (36.6 C) (Oral)  02/27/19 98 F (36.7 C) (Oral)   BP Readings from Last 3 Encounters:  04/21/19 124/74  04/11/19 (!) 143/82  03/28/19 127/79   Pulse Readings from Last 3 Encounters:  04/21/19 90  04/11/19 80  03/28/19 75    Gen:  alert, not ill-appearing, no distress, appropriate for age 13: head normocephalic without obvious abnormality, conjunctiva and cornea clear, trachea midline Pulm: Normal work of breathing, normal phonation Neuro: alert and oriented x 3 Psych: cooperative, euthymic mood, affect mood-congruent, speech is articulate, normal rate and volume; thought processes clear and goal-directed, normal judgment, good insight     No results found for this or any previous visit (from the past 72 hour(s)). No results found.    Assessment and Plan: 58 y.o. female with   .Azul was seen today for sinusitis and nasal congestion.  Diagnoses and all orders for this visit:  Acute bacterial sinusitis -     doxycycline (VIBRA-TABS) 100 MG tablet; Take 1 tablet (100 mg total) by mouth 2 (two) times daily for 7 days.  Epistaxis   Patient unable to provide VS. Physical exam limited by video visit 1 month of persistent symptoms including R maxillary sinus pressure, R sided nasal congestion and headache Treating empirically for bacterial sinusitis I do not feel clinically symptoms are consistent with COVID-19 infection  Counseled to stop Flonase for the next 1-2 weeks until epistaxis resolves and use Aquafor/Vaseline in the nasal cavity Patient encouraged to follow-up if no improvement in symptoms with antibiotic  Return to work note faxed to [phone]   Follow Up Instructions:    I discussed the assessment and treatment plan with the patient. The patient was provided an opportunity to ask questions and all were answered. The patient agreed with the plan and  demonstrated an understanding of the instructions.   The patient was advised to call back or seek an in-person evaluation if the symptoms worsen or if the condition fails to improve as anticipated.  I provided 10 minutes of non-face-to-face time during this encounter.   Trixie Dredge, [place]

## 2019-07-03 DIAGNOSIS — F17210 Nicotine dependence, cigarettes, uncomplicated: Secondary | ICD-10-CM | POA: Diagnosis not present

## 2019-07-03 DIAGNOSIS — R29818 Other symptoms and signs involving the nervous system: Secondary | ICD-10-CM | POA: Diagnosis not present

## 2019-07-03 DIAGNOSIS — R202 Paresthesia of skin: Secondary | ICD-10-CM | POA: Diagnosis not present

## 2019-07-03 DIAGNOSIS — G510 Bell's palsy: Secondary | ICD-10-CM | POA: Diagnosis not present

## 2019-07-03 DIAGNOSIS — Z888 Allergy status to other drugs, medicaments and biological substances status: Secondary | ICD-10-CM | POA: Diagnosis not present

## 2019-07-03 DIAGNOSIS — Z7982 Long term (current) use of aspirin: Secondary | ICD-10-CM | POA: Diagnosis not present

## 2019-07-03 DIAGNOSIS — Z79899 Other long term (current) drug therapy: Secondary | ICD-10-CM | POA: Diagnosis not present

## 2019-07-03 DIAGNOSIS — R200 Anesthesia of skin: Secondary | ICD-10-CM | POA: Diagnosis not present

## 2019-07-03 DIAGNOSIS — R51 Headache: Secondary | ICD-10-CM | POA: Diagnosis not present

## 2019-07-03 DIAGNOSIS — J329 Chronic sinusitis, unspecified: Secondary | ICD-10-CM | POA: Diagnosis not present

## 2019-07-04 ENCOUNTER — Telehealth: Payer: Self-pay | Admitting: Physician Assistant

## 2019-07-04 NOTE — Telephone Encounter (Addendum)
Pt called.  She was diagnosed with Bells Palsey in the ER and needs to be referred to a Neurologist. Pt wants a phone call.

## 2019-07-04 NOTE — Telephone Encounter (Signed)
Thank you :)

## 2019-07-04 NOTE — Telephone Encounter (Signed)
Please schedule patient for telephone visit

## 2019-07-04 NOTE — Telephone Encounter (Signed)
I scheduled her tomorrow morning for a doximity

## 2019-07-05 ENCOUNTER — Other Ambulatory Visit: Payer: Self-pay | Admitting: Physician Assistant

## 2019-07-05 ENCOUNTER — Encounter: Payer: Self-pay | Admitting: Physician Assistant

## 2019-07-05 ENCOUNTER — Telehealth: Payer: Self-pay | Admitting: Physician Assistant

## 2019-07-05 ENCOUNTER — Ambulatory Visit (INDEPENDENT_AMBULATORY_CARE_PROVIDER_SITE_OTHER): Payer: Federal, State, Local not specified - PPO | Admitting: Physician Assistant

## 2019-07-05 DIAGNOSIS — G510 Bell's palsy: Secondary | ICD-10-CM

## 2019-07-05 DIAGNOSIS — J0130 Acute sphenoidal sinusitis, unspecified: Secondary | ICD-10-CM | POA: Insufficient documentation

## 2019-07-05 DIAGNOSIS — I6781 Acute cerebrovascular insufficiency: Secondary | ICD-10-CM

## 2019-07-05 DIAGNOSIS — E876 Hypokalemia: Secondary | ICD-10-CM

## 2019-07-05 HISTORY — DX: Acute sphenoidal sinusitis, unspecified: J01.30

## 2019-07-05 HISTORY — DX: Acute cerebrovascular insufficiency: I67.81

## 2019-07-05 HISTORY — DX: Bell's palsy: G51.0

## 2019-07-05 MED ORDER — POTASSIUM CHLORIDE CRYS ER 20 MEQ PO TBCR: 20.0000 meq | EXTENDED_RELEASE_TABLET | Freq: Two times a day (BID) | ORAL | 0 refills | Status: DC

## 2019-07-05 NOTE — Progress Notes (Signed)
Virtual Visit via Telephone Note  I connected with Sholanda Sifford-Chalk on 07/17/19 at  8:50 AM EDT by telephone and verified that I am speaking with the correct person using two identifiers.   I discussed the limitations, risks, security and privacy concerns of performing an evaluation and management service by telephone and the availability of in person appointments. I also discussed with the patient that there may be a patient responsible charge related to this service. The patient expressed understanding and agreed to proceed.   History of Present Illness: HPI:                                                                Hailey Robinson is a 58 y.o. female   CC: hospital follow-up  She reports waking up Monday morning with sudden onset right sided facial weakness. She presented to [facility] ED on 07/03/19 and underwent [facility] which showed the following:  1. Interval development of complete opacification of the right sphenoid chamber as well as the majority of the right ethmoid air cells. This includes central hyperattenuating material, consistent with chronic inspissated secretions and/or fungal  colonization. There is also partially-imaged soft tissue in the posterior right nasal cavity, such that a small right nasal cavity polyp cannot be excluded. Please correlate clinically.  2. 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  3. No definite acute intracranial abnormality; however, if the patient has an acute neurological deficit, an MRI of the brain may be indicated (assuming no contraindication to MRI).  She was diagnosed with Bell's palsy and treated with Prednisone 60 mg burst x 6 days and Valtrex 1000 mg tid x 7 days. She was also referred to ENT (Dr. Dimas Millin) and Neurology (Dr. Joretta Bachelor) for the abnormal CT findings.     Past Medical History:  Diagnosis Date  . Anemia   . Arthritis    "think I have some in my lower back" (02/10/2017)  . Chronic bronchitis ([place]) 03/29/2019  .  Degenerative arthritis of cervical spine 03/29/2019  . Functional systolic murmur    normal ECHO 07/2016  . GERD (gastroesophageal reflux disease)    spicy foods  . Hypertension   . Left thyroid nodule 11/04/2016  . Pneumonia 10/2016  . Prediabetes 03/29/2019  . Seasonal allergies   . Unilateral vocal cord paralysis    Past Surgical History:  Procedure Laterality Date  . BIOPSY THYROID  12/2016 X 2  . LACERATION REPAIR Right 1985   pinky  . THYROID LOBECTOMY Left 02/10/2017  . THYROID LOBECTOMY Left 02/10/2017   Procedure: LEFT THYROID LOBECTOMY;  Surgeon: Ralene Ok, MD;  Location: [place];  Service: General;  Laterality: Left;  . TUBAL LIGATION     Social History   Tobacco Use  . Smoking status: Current Every Day Smoker    Packs/day: 0.50    Years: 15.00    Pack years: 7.50    Types: Cigarettes  . Smokeless tobacco: Never Used  . Tobacco comment: As per pt - smokes 10 cigs a day  Substance Use Topics  . Alcohol use: Yes    Alcohol/week: 2.0 standard drinks    Types: 2 Cans of beer per week    Comment: "weekends only"   family history includes Hyperlipidemia in her father and mother; Hypertension in her  father and mother; Thyroid disease in her sister.    ROS: negative except as noted in the HPI  Medications: Current Outpatient Medications  Medication Sig Dispense Refill  . amLODipine (NORVASC) 5 MG tablet Take 1 tablet (5 mg total) by mouth daily. 90 tablet 1  . aspirin EC 81 MG tablet Take 1 tablet (81 mg total) by mouth daily. 90 tablet 3  . atorvastatin (LIPITOR) 10 MG tablet Take 1 tablet (10 mg total) by mouth daily. Due for fasting labs 30 tablet 0  . doxepin (SINEQUAN) 25 MG capsule Take 1 capsule (25 mg total) by mouth at bedtime as needed (sleep/anxiety). 90 capsule 0  . fluticasone (FLONASE) 50 MCG/ACT nasal spray Place 1 spray into both nostrils daily. 16 g 0  . hydrochlorothiazide (HYDRODIURIL) 25 MG tablet Take 1 tablet (25 mg total) by mouth every  morning. 90 tablet 1  . levocetirizine (XYZAL ALLERGY 24HR) 5 MG tablet Take 1 tablet (5 mg total) by mouth every evening. 90 tablet 3  . potassium chloride SA (K-DUR) 20 MEQ tablet TAKE 1 TABLET BY MOUTH TWICE DAILY 180 tablet 0   No current facility-administered medications for this visit.    No Known Allergies     Objective:  There were no vitals taken for this visit.  Pulm: Normal work of breathing, normal phonation Neuro: alert and oriented x 3 Psych: cooperative,  speech is articulate, normal rate and volume; thought processes clear and goal-directed, normal judgment, good insight  Labs Care Everywhere 07/03/19 WBC 5.5 Hgb 13.4 Hct 40.8 Plt 208  K 3.0 Na 139 Scr 0.5 BUN 12 GFR 124    Acute Interface, Incoming Rad Results - 07/03/2019  5:29 PM EDT CT HEAD WITHOUT CONTRAST  INDICATION: Neuro deficit, acute, stroke suspected.  Additional information: Right facial numbness and headache, onset today.  TECHNIQUE: Axial CT images from skull base to vertex without IV contrast.  COMPARISON: 09/07/2008.  FINDINGS: Scattered small patchy areas of mild hypoattenuation in the bilateral cerebral white matter, without mass effect, are strictly nonspecific, but consistent with mild-to-moderate chronic microvascular changes (leukoaraiosis).  No evidence of an acute cortical-based (large artery territory) infarction.  No acute intracranial hemorrhage.  No intracranial mass.  No hydrocephalus.  The density of the dural venous sinuses is within normal limits.  Interval development of complete opacification of the right sphenoid chamber as well as the majority of the right ethmoid air cells. This includes central hyperattenuating material, consistent with chronic inspissated secretions and/or fungal  colonization. There is also partially-imaged soft tissue in the posterior right nasal cavity, such that a small right nasal cavity polyp cannot be excluded. Where imaged, the  right orbital fat is clear.  The tympanic cavities and mastoid air cells are clear.  The skull base and calvaria show no acute or aggressive-appearing abnormality.   IMPRESSION:   1. Interval development of complete opacification of the right sphenoid chamber as well as the majority of the right ethmoid air cells. This includes central hyperattenuating material, consistent with chronic inspissated secretions and/or fungal  colonization. There is also partially-imaged soft tissue in the posterior right nasal cavity, such that a small right nasal cavity polyp cannot be excluded. Please correlate clinically.  2. 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  3. No definite acute intracranial abnormality; however, if the patient has an acute neurological deficit, an MRI of the brain may be indicated (assuming no contraindication to MRI).  Electronically Signed by: Mali Holder  Assessment and Plan: 58 y.o. female with   .  Mazie was seen today for hospitalization follow-up.  Diagnoses and all orders for this visit:  Facial paralysis/Bells palsy  Subacute sphenoidal sinusitis  Leukoaraiosis  Hypokalemia due to excessive renal loss of potassium -     Discontinue: potassium chloride SA (K-DUR) 20 MEQ tablet; Take 1 tablet (20 mEq total) by mouth 2 (two) times daily. -     BASIC METABOLIC PANEL WITH GFR    Continue Doxycycline, Prednisone and Valtrex Work note provided for today and tomorrow She already has a referral placed by the ED to [facility] (Dr. Joretta Bachelor) and I gave her the contact info for that practice She is already established with ENT (Dr. Joya Gaskins) at [facility] and I instructed her to contact their office regarding her sphenoid sinusitis follow-up  Return to lab this week to re-check potassium Cont K-dur 20 meq bid  Follow-up as needed if symptoms worsen or fail to improve  Follow Up Instructions:    I discussed the assessment and treatment plan with the patient. The patient was  provided an opportunity to ask questions and all were answered. The patient agreed with the plan and demonstrated an understanding of the instructions.   The patient was advised to call back or seek an in-person evaluation if the symptoms worsen or if the condition fails to improve as anticipated.  I provided 13 minutes of non-face-to-face time during this encounter.   Trixie Dredge, [place]

## 2019-07-05 NOTE — Telephone Encounter (Signed)
Pt notified of results -EH/RMA   

## 2019-07-05 NOTE — Telephone Encounter (Signed)
Left voicemail for patient to callback clinic regarding her potassium level Potassium is low I want her to increase her potassium supplement to 20 mEq twice a day and recheck potassium next week

## 2019-07-06 DIAGNOSIS — J328 Other chronic sinusitis: Secondary | ICD-10-CM | POA: Diagnosis not present

## 2019-07-06 DIAGNOSIS — Z7952 Long term (current) use of systemic steroids: Secondary | ICD-10-CM | POA: Diagnosis not present

## 2019-07-06 DIAGNOSIS — F17210 Nicotine dependence, cigarettes, uncomplicated: Secondary | ICD-10-CM | POA: Diagnosis not present

## 2019-07-06 DIAGNOSIS — J302 Other seasonal allergic rhinitis: Secondary | ICD-10-CM | POA: Diagnosis not present

## 2019-07-06 DIAGNOSIS — R29810 Facial weakness: Secondary | ICD-10-CM | POA: Diagnosis not present

## 2019-07-06 DIAGNOSIS — J301 Allergic rhinitis due to pollen: Secondary | ICD-10-CM | POA: Diagnosis not present

## 2019-07-06 DIAGNOSIS — Z792 Long term (current) use of antibiotics: Secondary | ICD-10-CM | POA: Diagnosis not present

## 2019-07-17 ENCOUNTER — Encounter: Payer: Self-pay | Admitting: Physician Assistant

## 2019-07-20 DIAGNOSIS — F17200 Nicotine dependence, unspecified, uncomplicated: Secondary | ICD-10-CM | POA: Diagnosis not present

## 2019-07-20 DIAGNOSIS — J328 Other chronic sinusitis: Secondary | ICD-10-CM | POA: Diagnosis not present

## 2019-07-20 DIAGNOSIS — J301 Allergic rhinitis due to pollen: Secondary | ICD-10-CM | POA: Diagnosis not present

## 2019-07-20 DIAGNOSIS — K029 Dental caries, unspecified: Secondary | ICD-10-CM | POA: Diagnosis not present

## 2019-07-20 DIAGNOSIS — J302 Other seasonal allergic rhinitis: Secondary | ICD-10-CM | POA: Diagnosis not present

## 2019-07-20 DIAGNOSIS — R29810 Facial weakness: Secondary | ICD-10-CM | POA: Diagnosis not present

## 2019-07-20 DIAGNOSIS — F17210 Nicotine dependence, cigarettes, uncomplicated: Secondary | ICD-10-CM | POA: Diagnosis not present

## 2019-07-20 DIAGNOSIS — J342 Deviated nasal septum: Secondary | ICD-10-CM | POA: Diagnosis not present

## 2019-08-11 ENCOUNTER — Telehealth: Payer: Self-pay | Admitting: Physician Assistant

## 2019-08-11 DIAGNOSIS — G479 Sleep disorder, unspecified: Secondary | ICD-10-CM

## 2019-08-11 DIAGNOSIS — F430 Acute stress reaction: Secondary | ICD-10-CM

## 2019-08-11 MED ORDER — DOXEPIN HCL 25 MG PO CAPS: 25.0000 mg | ORAL_CAPSULE | Freq: Every evening | ORAL | 1 refills | Status: DC | PRN

## 2019-08-11 NOTE — Telephone Encounter (Signed)
Ok to refill? She was seen recently in office but not for diagnosis associated with that rx, routing.

## 2019-08-11 NOTE — Telephone Encounter (Signed)
Patient states that she ran out of this medication 2 days ago. States that she would like a refill on this. Please advise. This is for the: doxepin (SINEQUAN) 25 MG capsule [id number].

## 2019-08-14 NOTE — Telephone Encounter (Signed)
Pt advised.

## 2019-09-05 ENCOUNTER — Other Ambulatory Visit: Payer: Self-pay | Admitting: Physician Assistant

## 2019-09-05 DIAGNOSIS — E785 Hyperlipidemia, unspecified: Secondary | ICD-10-CM

## 2019-09-05 DIAGNOSIS — Z9189 Other specified personal risk factors, not elsewhere classified: Secondary | ICD-10-CM

## 2019-09-06 ENCOUNTER — Other Ambulatory Visit: Payer: Self-pay | Admitting: Neurology

## 2019-09-06 DIAGNOSIS — I10 Essential (primary) hypertension: Secondary | ICD-10-CM

## 2019-09-06 MED ORDER — HYDROCHLOROTHIAZIDE 25 MG PO TABS: 25.0000 mg | ORAL_TABLET | ORAL | 0 refills | Status: DC

## 2019-09-08 DIAGNOSIS — J328 Other chronic sinusitis: Secondary | ICD-10-CM | POA: Diagnosis not present

## 2019-09-08 DIAGNOSIS — Z01812 Encounter for preprocedural laboratory examination: Secondary | ICD-10-CM | POA: Diagnosis not present

## 2019-09-08 DIAGNOSIS — Z20828 Contact with and (suspected) exposure to other viral communicable diseases: Secondary | ICD-10-CM | POA: Diagnosis not present

## 2019-09-08 DIAGNOSIS — J342 Deviated nasal septum: Secondary | ICD-10-CM | POA: Diagnosis not present

## 2019-09-15 DIAGNOSIS — J3489 Other specified disorders of nose and nasal sinuses: Secondary | ICD-10-CM | POA: Diagnosis not present

## 2019-09-15 DIAGNOSIS — J324 Chronic pansinusitis: Secondary | ICD-10-CM | POA: Diagnosis not present

## 2019-09-15 DIAGNOSIS — J328 Other chronic sinusitis: Secondary | ICD-10-CM | POA: Diagnosis not present

## 2019-09-15 DIAGNOSIS — J342 Deviated nasal septum: Secondary | ICD-10-CM | POA: Diagnosis not present

## 2019-09-15 DIAGNOSIS — F17210 Nicotine dependence, cigarettes, uncomplicated: Secondary | ICD-10-CM | POA: Diagnosis not present

## 2019-09-15 DIAGNOSIS — J343 Hypertrophy of nasal turbinates: Secondary | ICD-10-CM | POA: Diagnosis not present

## 2019-09-21 DIAGNOSIS — J3489 Other specified disorders of nose and nasal sinuses: Secondary | ICD-10-CM | POA: Diagnosis not present

## 2019-09-21 DIAGNOSIS — J328 Other chronic sinusitis: Secondary | ICD-10-CM | POA: Diagnosis not present

## 2019-09-21 DIAGNOSIS — Z79899 Other long term (current) drug therapy: Secondary | ICD-10-CM | POA: Diagnosis not present

## 2019-10-02 ENCOUNTER — Other Ambulatory Visit: Payer: Self-pay

## 2019-10-02 DIAGNOSIS — Z9189 Other specified personal risk factors, not elsewhere classified: Secondary | ICD-10-CM

## 2019-10-02 DIAGNOSIS — E785 Hyperlipidemia, unspecified: Secondary | ICD-10-CM

## 2019-10-02 MED ORDER — ATORVASTATIN CALCIUM 10 MG PO TABS: 10.0000 mg | ORAL_TABLET | Freq: Every day | ORAL | 0 refills | Status: DC

## 2019-10-05 DIAGNOSIS — Z48810 Encounter for surgical aftercare following surgery on the sense organs: Secondary | ICD-10-CM | POA: Diagnosis not present

## 2019-10-05 DIAGNOSIS — Z7951 Long term (current) use of inhaled steroids: Secondary | ICD-10-CM | POA: Diagnosis not present

## 2019-10-05 DIAGNOSIS — J3489 Other specified disorders of nose and nasal sinuses: Secondary | ICD-10-CM | POA: Diagnosis not present

## 2019-11-02 ENCOUNTER — Other Ambulatory Visit: Payer: Self-pay | Admitting: Physician Assistant

## 2019-11-02 DIAGNOSIS — Z1231 Encounter for screening mammogram for malignant neoplasm of breast: Secondary | ICD-10-CM

## 2019-11-19 ENCOUNTER — Other Ambulatory Visit: Payer: Self-pay | Admitting: Physician Assistant

## 2019-11-19 DIAGNOSIS — I10 Essential (primary) hypertension: Secondary | ICD-10-CM

## 2019-11-20 ENCOUNTER — Other Ambulatory Visit: Payer: Self-pay | Admitting: Physician Assistant

## 2019-11-20 DIAGNOSIS — I10 Essential (primary) hypertension: Secondary | ICD-10-CM

## 2019-11-20 MED ORDER — AMLODIPINE BESYLATE 5 MG PO TABS: 5.0000 mg | ORAL_TABLET | Freq: Every day | ORAL | 0 refills | Status: DC

## 2019-12-19 ENCOUNTER — Other Ambulatory Visit: Payer: Self-pay | Admitting: Physician Assistant

## 2019-12-19 DIAGNOSIS — I10 Essential (primary) hypertension: Secondary | ICD-10-CM

## 2020-01-10 ENCOUNTER — Other Ambulatory Visit: Payer: Self-pay | Admitting: Neurology

## 2020-01-10 DIAGNOSIS — G479 Sleep disorder, unspecified: Secondary | ICD-10-CM

## 2020-01-10 DIAGNOSIS — F430 Acute stress reaction: Secondary | ICD-10-CM

## 2020-01-10 MED ORDER — DOXEPIN HCL 25 MG PO CAPS: 25.0000 mg | ORAL_CAPSULE | Freq: Every evening | ORAL | 0 refills | Status: DC | PRN

## 2020-02-02 ENCOUNTER — Ambulatory Visit: Payer: Federal, State, Local not specified - PPO | Admitting: Nurse Practitioner

## 2020-02-12 ENCOUNTER — Ambulatory Visit: Payer: Federal, State, Local not specified - PPO | Admitting: Nurse Practitioner

## 2020-02-12 ENCOUNTER — Other Ambulatory Visit (HOSPITAL_COMMUNITY)
Admission: RE | Admit: 2020-02-12 | Discharge: 2020-02-12 | Disposition: A | Discharge status: Home / Self Care | Payer: Federal, State, Local not specified - PPO | Source: Ambulatory Visit | Attending: Nurse Practitioner | Admitting: Nurse Practitioner

## 2020-02-12 ENCOUNTER — Other Ambulatory Visit: Payer: Self-pay

## 2020-02-12 ENCOUNTER — Encounter: Payer: Self-pay | Admitting: Nurse Practitioner

## 2020-02-12 VITALS — BP 136/83 | HR 90 | Temp 98.1°F | Ht 66.0 in | Wt 201.0 lb

## 2020-02-12 DIAGNOSIS — Z124 Encounter for screening for malignant neoplasm of cervix: Secondary | ICD-10-CM

## 2020-02-12 DIAGNOSIS — Z0000 Encounter for general adult medical examination without abnormal findings: Secondary | ICD-10-CM | POA: Diagnosis not present

## 2020-02-12 DIAGNOSIS — R7303 Prediabetes: Secondary | ICD-10-CM

## 2020-02-12 DIAGNOSIS — G479 Sleep disorder, unspecified: Secondary | ICD-10-CM

## 2020-02-12 DIAGNOSIS — E785 Hyperlipidemia, unspecified: Secondary | ICD-10-CM

## 2020-02-12 DIAGNOSIS — R21 Rash and other nonspecific skin eruption: Secondary | ICD-10-CM | POA: Diagnosis not present

## 2020-02-12 DIAGNOSIS — Z8639 Personal history of other endocrine, nutritional and metabolic disease: Secondary | ICD-10-CM

## 2020-02-12 DIAGNOSIS — B353 Tinea pedis: Secondary | ICD-10-CM

## 2020-02-12 DIAGNOSIS — I10 Essential (primary) hypertension: Secondary | ICD-10-CM | POA: Diagnosis not present

## 2020-02-12 DIAGNOSIS — K0889 Other specified disorders of teeth and supporting structures: Secondary | ICD-10-CM

## 2020-02-12 DIAGNOSIS — E876 Hypokalemia: Secondary | ICD-10-CM

## 2020-02-12 DIAGNOSIS — Z9189 Other specified personal risk factors, not elsewhere classified: Secondary | ICD-10-CM

## 2020-02-12 LAB — TSH+FREE T4: TSH W/REFLEX TO FT4: 2.5 mIU/L (ref 0.40–4.50)

## 2020-02-12 MED ORDER — DOXEPIN HCL 25 MG PO CAPS: 25.0000 mg | ORAL_CAPSULE | Freq: Every evening | ORAL | 3 refills | Status: DC | PRN

## 2020-02-12 MED ORDER — POTASSIUM CHLORIDE CRYS ER 20 MEQ PO TBCR: 20.0000 meq | EXTENDED_RELEASE_TABLET | Freq: Two times a day (BID) | ORAL | 3 refills | Status: DC

## 2020-02-12 MED ORDER — TRIAMCINOLONE ACETONIDE 0.5 % EX OINT: 1.0000 "application " | TOPICAL_OINTMENT | Freq: Two times a day (BID) | CUTANEOUS | 1 refills | Status: DC

## 2020-02-12 MED ORDER — AMLODIPINE BESYLATE 5 MG PO TABS: 5.0000 mg | ORAL_TABLET | Freq: Every day | ORAL | 5 refills | Status: DC

## 2020-02-12 MED ORDER — HYDROCHLOROTHIAZIDE 25 MG PO TABS: 25.0000 mg | ORAL_TABLET | Freq: Every day | ORAL | 5 refills | Status: DC

## 2020-02-12 MED ORDER — CLOTRIMAZOLE-BETAMETHASONE 1-0.05 % EX CREA: 1.0000 "application " | TOPICAL_CREAM | Freq: Two times a day (BID) | CUTANEOUS | 0 refills | Status: DC

## 2020-02-12 MED ORDER — ATORVASTATIN CALCIUM 10 MG PO TABS: 10.0000 mg | ORAL_TABLET | Freq: Every day | ORAL | 5 refills | Status: DC

## 2020-02-12 NOTE — Progress Notes (Signed)
Established Patient Office Visit  Subjective:  Patient ID: Hailey Robinson, female    DOB: January 12, 1961  Age: 59 y.o. MRN: [id number]  CC:  Chief Complaint  Patient presents with  . Gynecologic Exam    HPI Hailey Robinson presents for annual physical exam with gynecological exam. She reports she is overall healthy and does not have many concerns today. She would like to discuss refills on medication today and requests referrals. She is in need of a mammogram. She was a patient of Nelson Chimes, PA-c and would like to establish care with me today.   HYPERTENSION / HYPERLIPIDEMIA Current Medications and Doses: amlodipine 5mg  - has been out for a while, HCTZ 25mg , atorvastatin 10mg - has been out for a while Satisfied with current treatment? yes Duration of hypertension: chronic Medication compliance: fair compliance BP monitoring frequency: a few times a month BP range: 120's-130's/70's-80's BP medication side effects: no Cholesterol medication side effects: no Cholesterol supplements: none Duration of hyperlipidemia: chronic Aspirin: yes Recent stressors: no Recurrent headaches: no Visual changes: no Palpitations: no Dyspnea: no Chest pain: no Lower extremity edema: no Dizzy/lightheaded: no  HYPOTHYROIDISM Current Medication and Dosages:none Thyroid control status:No follow-up in some time- will send referral today Satisfied with current treatment? yes Medication side effects: N/A Medication compliance: N/A Etiology of hypothyroidism:  Recent dose adjustment:N/A Fatigue: yes Cold intolerance: no Heat intolerance: no Weight gain: no Weight loss: no Constipation: yes Diarrhea/loose stools: no Palpitations: no Lower extremity edema: no Anxiety/depressed mood: no  ECZEMA Pt reports a red, itchy rash on her elbow. She reports this is an ongoing issue that exacerbates in the winter months and resolves in the spring. She reports it typically only appears on the  extensor surface of the elbow, mostly on the right side. She has used steroid cream in the past with success and is requesting a refill today.   TINEA PEDIS Pt reports itching between her toes for the past several weeks. She reports the itching is at its worst when she gets out of the shower or her feet are hot. She has been using hydrocortisone cream daily and reports this is somewhat helpful of the symptoms.   TOOTH/GUM PAIN Patient reports she had a tooth break off a few months ago and is now experiencing some tenderness to the area. She has not seen a dentist in quite some time and would like a referral today to someone in the [facility] area. She denies fever, swelling, or redness to the area. She has not tried anything to make it better and nothing seems to make it worse.    She also reports she had sinus surgery last fall when it was found that a polyp was blocking her ethmoid sinuses. She reports that since the surgery she has not had any issues with frequent headaches or sinus pain or pressure. She has a follow-up appointment tomorrow with the specialist for this.   Past Medical History:  Diagnosis Date  . Anemia   . Arthritis    "think I have some in my lower back" (02/10/2017)  . Chronic bronchitis ([place]) 03/29/2019  . Degenerative arthritis of cervical spine 03/29/2019  . Functional systolic murmur    normal ECHO 07/2016  . GERD (gastroesophageal reflux disease)    spicy foods  . Hypertension   . Left thyroid nodule 11/04/2016  . Pneumonia 10/2016  . Prediabetes 03/29/2019  . Seasonal allergies   . Unilateral vocal cord paralysis     Past Surgical History:  Procedure  Laterality Date  . BIOPSY THYROID  12/2016 X 2  . LACERATION REPAIR Right 1985   pinky  . THYROID LOBECTOMY Left 02/10/2017  . THYROID LOBECTOMY Left 02/10/2017   Procedure: LEFT THYROID LOBECTOMY;  Surgeon: Ralene Ok, MD;  Location: [facility];  Service: General;  Laterality: Left;  . TUBAL LIGATION       Family History  Problem Relation Age of Onset  . Hyperlipidemia Mother   . Hypertension Mother   . Hypertension Father   . Hyperlipidemia Father   . Thyroid disease Sister     Social History   Socioeconomic History  . Marital status: Married    Spouse name: Not on file  . Number of children: Not on file  . Years of education: Not on file  . Highest education level: Not on file  Occupational History  . Not on file  Tobacco Use  . Smoking status: Current Every Day Smoker    Packs/day: 0.50    Years: 15.00    Pack years: 7.50    Types: Cigarettes  . Smokeless tobacco: Never Used  . Tobacco comment: As per pt - smokes 10 cigs a day  Substance and Sexual Activity  . Alcohol use: Yes    Alcohol/week: 2.0 standard drinks    Types: 2 Cans of beer per week    Comment: "weekends only"  . Drug use: No  . Sexual activity: Yes  Other Topics Concern  . Not on file  Social History Narrative  . Not on file   Social Determinants of Health   Financial Resource Strain:   . Difficulty of Paying Living Expenses:   Food Insecurity:   . Worried About Charity fundraiser in the Last Year:   . Arboriculturist in the Last Year:   Transportation Needs:   . Film/video editor (Medical):   Marland Kitchen Lack of Transportation (Non-Medical):   Physical Activity:   . Days of Exercise per Week:   . Minutes of Exercise per Session:   Stress:   . Feeling of Stress :   Social Connections:   . Frequency of Communication with Friends and Family:   . Frequency of Social Gatherings with Friends and Family:   . Attends Religious Services:   . Active Member of Clubs or Organizations:   . Attends Archivist Meetings:   Marland Kitchen Marital Status:   Intimate Partner Violence:   . Fear of Current or Ex-Partner:   . Emotionally Abused:   Marland Kitchen Physically Abused:   . Sexually Abused:     Outpatient Medications Prior to Visit  Medication Sig Dispense Refill  . aspirin EC 81 MG tablet Take 1 tablet  (81 mg total) by mouth daily. 90 tablet 3  . doxepin (SINEQUAN) 25 MG capsule Take 1 capsule (25 mg total) by mouth at bedtime as needed (sleep/anxiety). OFFICE VISIT FOR REFILLS 90 capsule 0  . fexofenadine (ALLEGRA) 180 MG tablet Take 180 mg by mouth daily.    . fluticasone (FLONASE) 50 MCG/ACT nasal spray Place 1 spray into both nostrils daily. 16 g 0  . hydrochlorothiazide (HYDRODIURIL) 25 MG tablet Take 1 tablet (25 mg total) by mouth daily. NEEDS LABS 15 tablet 0  . potassium chloride SA (K-DUR) 20 MEQ tablet TAKE 1 TABLET BY MOUTH TWICE DAILY 180 tablet 0  . amLODipine (NORVASC) 5 MG tablet Take 1 tablet (5 mg total) by mouth daily. **PATIENT NEEDS OFFICE VISIT FOR ADDITIONAL REFILLS** (Patient not taking: Reported on  02/12/2020) 30 tablet 0  . atorvastatin (LIPITOR) 10 MG tablet Take 1 tablet (10 mg total) by mouth daily. NEED LABS (Patient not taking: Reported on 02/12/2020) 7 tablet 0  . levocetirizine (XYZAL ALLERGY 24HR) 5 MG tablet Take 1 tablet (5 mg total) by mouth every evening. (Patient not taking: Reported on 02/12/2020) 90 tablet 3   No facility-administered medications prior to visit.    Allergies  Allergen Reactions  . Codeine Hives and Itching    ROS Review of Systems  Constitutional: Negative for appetite change, chills, fatigue, fever and unexpected weight change.  HENT: Positive for dental problem. Negative for congestion, drooling, facial swelling, hearing loss, mouth sores, nosebleeds, postnasal drip, rhinorrhea, sinus pressure, sinus pain, sore throat and trouble swallowing.   Respiratory: Negative for cough, chest tightness and shortness of breath.   Cardiovascular: Negative for chest pain, palpitations and leg swelling.  Gastrointestinal: Positive for constipation. Negative for abdominal distention, abdominal pain, diarrhea, nausea and vomiting.  Endocrine: Negative for cold intolerance and heat intolerance.  Genitourinary: Negative for difficulty urinating,  dysuria, urgency, vaginal bleeding, vaginal discharge and vaginal pain.  Musculoskeletal: Negative for arthralgias, gait problem, joint swelling and myalgias.  Skin: Positive for rash. Negative for color change and wound.  Neurological: Negative for dizziness, weakness, light-headedness and headaches.  Psychiatric/Behavioral: Negative for behavioral problems and sleep disturbance. The patient is not nervous/anxious.       Objective:    Physical Exam  Constitutional: She is oriented to person, place, and time. Vital signs are normal. She appears well-developed and well-nourished.  HENT:  Head: Normocephalic and atraumatic.  Right Ear: External ear normal.  Left Ear: External ear normal.  Nose: Nose normal.  Mouth/Throat: Oropharynx is clear and moist.  Eyes: Pupils are equal, round, and reactive to light. Conjunctivae and EOM are normal.  Neck: No JVD present. No thyromegaly present.  Cardiovascular: Normal rate, regular rhythm and normal heart sounds.  Pulmonary/Chest: Effort normal. No accessory muscle usage. No respiratory distress. She has no decreased breath sounds. She has wheezes in the right lower field. She has no rhonchi. She has no rales. She exhibits no tenderness, no edema and no deformity. Right breast exhibits no nipple discharge, no skin change and no tenderness. Left breast exhibits no nipple discharge, no skin change and no tenderness. No breast swelling, tenderness, discharge or bleeding. Breasts are symmetrical.  Diffuse, non-tender, mobile, densities noted bilaterally in both breasts on exam. No changes in skin texture or color. No nipple discharge.   Abdominal: Soft. Bowel sounds are normal. She exhibits no distension. There is no abdominal tenderness. There is no rebound and no guarding.  Genitourinary:    Pelvic exam was performed with patient prone.  There is no rash, tenderness, lesion or injury on the right labia. There is no rash, tenderness, lesion or injury on  the left labia. Uterus is not deviated, not enlarged and not tender. Cervix exhibits discharge. Cervix exhibits no motion tenderness and no friability.    Vaginal discharge present.     No vaginal tenderness.  No tenderness in the vagina.     Musculoskeletal:        General: No tenderness or edema. Normal range of motion.     Cervical back: Normal range of motion and neck supple.  Lymphadenopathy:    She has no cervical adenopathy.    She has no axillary adenopathy.  Neurological: She is alert and oriented to person, place, and time. She has normal strength and normal  reflexes. No cranial nerve deficit or sensory deficit. Coordination normal.  Skin: Skin is warm, dry and intact. Rash noted.     Psychiatric: She has a normal mood and affect. Her speech is normal and behavior is normal. Judgment and thought content normal. Cognition and memory are normal.  Nursing note and vitals reviewed.   BP 136/83   Pulse 90   Temp 98.1 F (36.7 C) (Oral)   Ht 5\' 6"  (1.676 m)   Wt 201 lb (91.2 kg)   SpO2 95%   BMI 32.44 kg/m  Wt Readings from Last 3 Encounters:  02/12/20 201 lb (91.2 kg)  04/21/19 184 lb (83.5 kg)  04/11/19 184 lb (83.5 kg)     Health Maintenance Due  Topic Date Due  . PAP SMEAR-Modifier  Never done  . MAMMOGRAM  Never done    There are no preventive care reminders to display for this patient.  Lab Results  Component Value Date   TSH 1.53 06/17/2017   Lab Results  Component Value Date   WBC 4.5 03/28/2019   HGB 12.8 03/28/2019   HCT 38.5 03/28/2019   MCV 85.7 03/28/2019   PLT 235 03/28/2019   Lab Results  Component Value Date   NA 140 04/11/2019   K 3.7 04/11/2019   CO2 24 04/11/2019   GLUCOSE 79 04/11/2019   BUN 8 04/11/2019   CREATININE 0.63 04/11/2019   BILITOT 0.3 03/28/2019   ALKPHOS 77 11/04/2016   AST 14 03/28/2019   ALT 11 03/28/2019   PROT 7.2 03/28/2019   ALBUMIN 4.1 11/04/2016   CALCIUM 10.3 04/11/2019   ANIONGAP 12 02/10/2017    Lab Results  Component Value Date   CHOL 169 05/05/2018   Lab Results  Component Value Date   HDL 47 (L) 05/05/2018   Lab Results  Component Value Date   LDLCALC 108 (H) 05/05/2018   Lab Results  Component Value Date   TRIG 56 05/05/2018   Lab Results  Component Value Date   CHOLHDL 3.6 05/05/2018   Lab Results  Component Value Date   HGBA1C 6.0 (H) 03/28/2019      Assessment & Plan:  1. Encounter for annual physical exam Pap performed today. She does have a history of previous cervical cryo therapy- discussed the presence of a lesion today.  Will follow-up with the patient if further evaluation is needed once results of pap and HPV testing received .  Will obtain labs today.  - CBC with Differential/Platelet - COMPLETE METABOLIC PANEL WITH GFR - Lipid panel - Hemoglobin A1c - PAP and HPV  2. Pre-diabetes History of pre-diabetes. Patient is historically diet controlled and not currently taking any medication. She is at high risk of complication with other co-morbidities including hypertension and hyperlipidemia.  Will obtain labs today and make changes to the plan of care as needed based on results.  - CBC with Differential/Platelet - COMPLETE METABOLIC PANEL WITH GFR - Lipid panel - Hemoglobin A1c  3. Rash and nonspecific skin eruption Eczema-like rash present on right arm. Chronic in nature with previous relief with triamcinolone. No indication of infection or poor wound healing at this time.    Refill provided for triamcinolone. Patient to follow-up if symptoms worsen or fail to improve.  - triamcinolone ointment (KENALOG) 0.5 %; Apply 1 application topically 2 (two) times daily. To elbows for rash- wash hands after use- avoid eyes and face  Dispense: 30 g; Refill: 1  4. Sleep disturbance Self-reported improvement of sleep  with doxepin.  Refill provided today.  - doxepin (SINEQUAN) 25 MG capsule; Take 1 capsule (25 mg total) by mouth at bedtime as needed  (sleep/anxiety).  Dispense: 90 capsule; Refill: 3  5. Essential hypertension Well controlled blood pressure with current regimen. Patient is at an increased risk of complication with other co-morbidities including hyperlipidemia and pre-diabetes. Will follow closely.  Will obtain labs today.   Refills provided. Will follow-up with patient on any changes that need to be made on plan of care based on lab results.  - hydrochlorothiazide (HYDRODIURIL) 25 MG tablet; Take 1 tablet (25 mg total) by mouth daily.  Dispense: 30 tablet; Refill: 5 - COMPLETE METABOLIC PANEL WITH GFR - Lipid panel - amLODipine (NORVASC) 5 MG tablet; Take 1 tablet (5 mg total) by mouth daily.  Dispense: 30 tablet; Refill: 5 - Hemoglobin A1c  6. Hypokalemia due to excessive renal loss of potassium Will obtain labs today to determine if medication management is still required.  Refill provided.  - potassium chloride SA (KLOR-CON) 20 MEQ tablet; Take 1 tablet (20 mEq total) by mouth 2 (two) times daily.  Dispense: 180 tablet; Refill: 3  7. At risk for cardiovascular event Will obtain labs today. No symptoms for cardiovascular event present at this time. Patient has significant co-morbidities that increase her cardiovascular risks.  Patient education on diet and prevention including smoking cessation.  - hydrochlorothiazide (HYDRODIURIL) 25 MG tablet; Take 1 tablet (25 mg total) by mouth daily.  Dispense: 30 tablet; Refill: 5 - CBC with Differential/Platelet - COMPLETE METABOLIC PANEL WITH GFR - Lipid panel - amLODipine (NORVASC) 5 MG tablet; Take 1 tablet (5 mg total) by mouth daily.  Dispense: 30 tablet; Refill: 5 - atorvastatin (LIPITOR) 10 MG tablet; Take 1 tablet (10 mg total) by mouth daily.  Dispense: 30 tablet; Refill: 5 - Hemoglobin A1c  8. Dyslipidemia, goal LDL below 70 No concerning symptoms at this time, but patient at increased risk of complications due to co-morbidities of hypertension and pre diabetes.  Will obtain labs today to evaluate effectiveness of medication.  - Lipid panel - atorvastatin (LIPITOR) 10 MG tablet; Take 1 tablet (10 mg total) by mouth daily.  Dispense: 30 tablet; Refill: 5 - Hemoglobin A1c  9. Pap smear for cervical cancer screening PAP and HPV testing today. Patient does have a history of abnormal pap requiring cryo therapy. Cervical lesion observed today. It is unclear if this is scarring from previous treatment. Will follow closely. - Cytology - PAP  10. Tinea pedis of both feet Symptoms and presentation correlate with tinea pedis. Patient instructed to keep feet dry and to wear protective footwear when in showers other than home.  Prescription provided today.  - clotrimazole-betamethasone (LOTRISONE) cream; Apply 1 application topically 2 (two) times daily. To feet.  Dispense: 45 g; Refill: 0  11. H/O Graves' disease Will obtain labs today and refer to endocrinology for follow-up. The patient has not had an evaluation by a specialist in some time.  - TSH + free T4 - Ambulatory referral to Endocrinology  12. Tooth pain Referral to dentistry provided.  - Ambulatory referral to Dentistry  Return in about 6 months (around 08/14/2020) for BP .  Orma Render, NP

## 2020-02-12 NOTE — Patient Instructions (Signed)
Preventive Care 40-58 Years Old, Female Preventive care refers to visits with your health care provider and lifestyle choices that can promote health and wellness. This includes:  A yearly physical exam. This may also be called an annual well check.  Regular dental visits and eye exams.  Immunizations.  Screening for certain conditions.  Healthy lifestyle choices, such as eating a healthy diet, getting regular exercise, not using drugs or products that contain nicotine and tobacco, and limiting alcohol use. What can I expect for my preventive care visit? Physical exam Your health care provider will check your:  Height and weight. This may be used to calculate body mass index (BMI), which tells if you are at a healthy weight.  Heart rate and blood pressure.  Skin for abnormal spots. Counseling Your health care provider may ask you questions about your:  Alcohol, tobacco, and drug use.  Emotional well-being.  Home and relationship well-being.  Sexual activity.  Eating habits.  Work and work environment.  Method of birth control.  Menstrual cycle.  Pregnancy history. What immunizations do I need?  Influenza (flu) vaccine  This is recommended every year. Tetanus, diphtheria, and pertussis (Tdap) vaccine  You may need a Td booster every 10 years. Varicella (chickenpox) vaccine  You may need this if you have not been vaccinated. Zoster (shingles) vaccine  You may need this after age 60. Measles, mumps, and rubella (MMR) vaccine  You may need at least one dose of MMR if you were born in 1957 or later. You may also need a second dose. Pneumococcal conjugate (PCV13) vaccine  You may need this if you have certain conditions and were not previously vaccinated. Pneumococcal polysaccharide (PPSV23) vaccine  You may need one or two doses if you smoke cigarettes or if you have certain conditions. Meningococcal conjugate (MenACWY) vaccine  You may need this if you  have certain conditions. Hepatitis A vaccine  You may need this if you have certain conditions or if you travel or work in places where you may be exposed to hepatitis A. Hepatitis B vaccine  You may need this if you have certain conditions or if you travel or work in places where you may be exposed to hepatitis B. Haemophilus influenzae type b (Hib) vaccine  You may need this if you have certain conditions. Human papillomavirus (HPV) vaccine  If recommended by your health care provider, you may need three doses over 6 months. You may receive vaccines as individual doses or as more than one vaccine together in one shot (combination vaccines). Talk with your health care provider about the risks and benefits of combination vaccines. What tests do I need? Blood tests  Lipid and cholesterol levels. These may be checked every 5 years, or more frequently if you are over 50 years old.  Hepatitis C test.  Hepatitis B test. Screening  Lung cancer screening. You may have this screening every year starting at age 55 if you have a 30-pack-year history of smoking and currently smoke or have quit within the past 15 years.  Colorectal cancer screening. All adults should have this screening starting at age 50 and continuing until age 75. Your health care provider may recommend screening at age 45 if you are at increased risk. You will have tests every 1-10 years, depending on your results and the type of screening test.  Diabetes screening. This is done by checking your blood sugar (glucose) after you have not eaten for a while (fasting). You may have this   done every 1-3 years.  Mammogram. This may be done every 1-2 years. Talk with your health care provider about when you should start having regular mammograms. This may depend on whether you have a family history of breast cancer.  BRCA-related cancer screening. This may be done if you have a family history of breast, ovarian, tubal, or peritoneal  cancers.  Pelvic exam and Pap test. This may be done every 3 years starting at age 61. Starting at age 91, this may be done every 5 years if you have a Pap test in combination with an HPV test. Other tests  Sexually transmitted disease (STD) testing.  Bone density scan. This is done to screen for osteoporosis. You may have this scan if you are at high risk for osteoporosis. Follow these instructions at home: Eating and drinking  Eat a diet that includes fresh fruits and vegetables, whole grains, lean protein, and low-fat dairy.  Take vitamin and mineral supplements as recommended by your health care provider.  Do not drink alcohol if: ? Your health care provider tells you not to drink. ? You are pregnant, may be pregnant, or are planning to become pregnant.  If you drink alcohol: ? Limit how much you have to 0-1 drink a day. ? Be aware of how much alcohol is in your drink. In the [place] one drink equals one 12 oz bottle of beer (355 mL), one 5 oz glass of wine (148 mL), or one 1 oz glass of hard liquor (44 mL). Lifestyle  Take daily care of your teeth and gums.  Stay active. Exercise for at least 30 minutes on 5 or more days each week.  Do not use any products that contain nicotine or tobacco, such as cigarettes, e-cigarettes, and chewing tobacco. If you need help quitting, ask your health care provider.  If you are sexually active, practice safe sex. Use a condom or other form of birth control (contraception) in order to prevent pregnancy and STIs (sexually transmitted infections).  If told by your health care provider, take low-dose aspirin daily starting at age 43. What's next?  Visit your health care provider once a year for a well check visit.  Ask your health care provider how often you should have your eyes and teeth checked.  Stay up to date on all vaccines. This information is not intended to replace advice given to you by your health care provider. Make sure you  discuss any questions you have with your health care provider. Document Revised: 07/21/2018 Document Reviewed: 07/21/2018 Elsevier Patient Education  [place]  Athlete's foot (tinea pedis) is a fungal infection of the skin on your feet. It often occurs on the skin that is between or underneath the toes. It can also occur on the soles of your feet. The infection can spread from person to person (is contagious). It can also spread when a person's bare feet come in contact with the fungus on shower floors or on items such as shoes. What are the causes? This condition is caused by a fungus that grows in warm, moist places. You can get athlete's foot by sharing shoes, shower stalls, towels, and wet floors with someone who is infected. Not washing your feet or changing your socks often enough can also lead to athlete's foot. What increases the risk? This condition is more likely to develop in:  Men.  People who have a weak body defense system (immune system).  People who have diabetes.  People who use public showers, such as at a gym.  People who wear heavy-duty shoes, such as Environmental manager.  Seasons with warm, humid weather. What are the signs or symptoms? Symptoms of this condition include:  Itchy areas between your toes or on the soles of your feet.  White, flaky, or scaly areas between your toes or on the soles of your feet.  Very itchy small blisters between your toes or on the soles of your feet.  Small cuts in your skin. These cuts can become infected.  Thick or discolored toenails. How is this diagnosed? This condition may be diagnosed with a physical exam and a review of your medical history. Your health care provider may also take a skin or toenail sample to examine under a microscope. How is this treated? This condition is treated with antifungal medicines. These may be applied as powders, ointments, or creams. In severe cases, an  oral antifungal medicine may be given. Follow these instructions at home: Medicines  Apply or take over-the-counter and prescription medicines only as told by your health care provider.  Apply your antifungal medicine as told by your health care provider. Do not stop using the antifungal even if your condition improves. Foot care  Do not scratch your feet.  Keep your feet dry: ? Wear cotton or wool socks. Change your socks every day or if they become wet. ? Wear shoes that allow air to flow, such as sandals or canvas tennis shoes.  Wash and dry your feet, including the area between your toes. Also, wash and dry your feet: ? Every day or as told by your health care provider. ? After exercising. General instructions  Do not let others use towels, shoes, nail clippers, or other personal items that touch your feet.  Protect your feet by wearing sandals in wet areas, such as locker rooms and shared showers.  Keep all follow-up visits as told by your health care provider. This is important.  If you have diabetes, keep your blood sugar under control. Contact a health care provider if:  You have a fever.  You have swelling, soreness, warmth, or redness in your foot.  Your feet are not getting better with treatment.  Your symptoms get worse.  You have new symptoms. Summary  Athlete's foot (tinea pedis) is a fungal infection of the skin on your feet. It often occurs on skin that is between or underneath the toes.  This condition is caused by a fungus that grows in warm, moist places.  Symptoms include white, flaky, or scaly areas between your toes or on the soles of your feet.  This condition is treated with antifungal medicines.  Keep your feet clean. Always dry them thoroughly. This information is not intended to replace advice given to you by your health care provider. Make sure you discuss any questions you have with your health care provider. Document Revised: 11/04/2017  Document Reviewed: 08/30/2017 Elsevier Patient Education  [place].   Eczema Eczema is a broad term for a group of skin conditions that cause skin to become rough and inflamed. Each type of eczema has different triggers, symptoms, and treatments. Eczema of any type is usually itchy and symptoms range from mild to severe. Eczema and its symptoms are not spread from person to person (are not contagious). It can appear on different parts of the body at different times. Your eczema may not look the same as someone else's eczema. What are the types of  eczema? Atopic dermatitis This is a long-term (chronic) skin disease that keeps coming back (recurring). Usual symptoms are dry skin and small, solid pimples that may swell and leak fluid (weep). Contact dermatitis  This happens when something irritates the skin and causes a rash. The irritation can come from substances that you are allergic to (allergens), such as poison ivy, chemicals, or medicines that were applied to your skin. Dyshidrotic eczema This is a form of eczema on the hands and feet. It shows up as very itchy, fluid-filled blisters. It can affect people of any age, but is more common before age 68. Hand eczema  This causes very itchy areas of skin on the palms and sides of the hands and fingers. This type of eczema is common in industrial jobs where you may be exposed to many different types of irritants. Lichen simplex chronicus This type of eczema occurs when a person constantly scratches one area of the body. Repeated scratching of the area leads to thickened skin (lichenification). Lichen simplex chronicus can occur along with other types of eczema. It is more common in adults, but may be seen in children as well. Nummular eczema This is a common type of eczema. It has no known cause. It typically causes a red, circular, crusty lesion (plaque) that may be itchy. Scratching may become a habit and can cause bleeding. Nummular  eczema occurs most often in people of middle-age or older. It most often affects the hands. Seborrheic dermatitis This is a common skin disease that mainly affects the scalp. It may also affect any oily areas of the body, such as the face, sides of nose, eyebrows, ears, eyelids, and chest. It is marked by small scaling and redness of the skin (erythema). This can affect people of all ages. In infants, this condition is known as Chartered certified accountant." Stasis dermatitis This is a common skin disease that usually appears on the legs and feet. It most often occurs in people who have a condition that prevents blood from being pumped through the veins in the legs (chronic venous insufficiency). Stasis dermatitis is a chronic condition that needs long-term management. How is eczema diagnosed? Your health care provider will examine your skin and review your medical history. He or she may also give you skin patch tests. These tests involve taking patches that contain possible allergens and placing them on your back. He or she will then check in a few days to see if an allergic reaction occurred. What are the common treatments? Treatment for eczema is based on the type of eczema you have. Hydrocortisone steroid medicine can relieve itching quickly and help reduce inflammation. This medicine may be prescribed or obtained over-the-counter, depending on the strength of the medicine that is needed. Follow these instructions at home:  Take over-the-counter and prescription medicines only as told by your health care provider.  Use creams or ointments to moisturize your skin. Do not use lotions.  Learn what triggers or irritates your symptoms. Avoid these things.  Treat symptom flare-ups quickly.  Do not itch your skin. This can make your rash worse.  Keep all follow-up visits as told by your health care provider. This is important. Where to find more information  The American Academy of Dermatology:  http://jones-macias.info/  The National Eczema Association: www.nationaleczema.org Contact a health care provider if:  You have serious itching, even with treatment.  You regularly scratch your skin until it bleeds.  Your rash looks different than usual.  Your skin is painful,  swollen, or more red than usual.  You have a fever. Summary  There are eight general types of eczema. Each type has different triggers.  Eczema of any type causes itching that may range from mild to severe.  Treatment varies based on the type of eczema you have. Hydrocortisone steroid medicine can help with itching and inflammation.  Protecting your skin is the best way to prevent eczema. Use moisturizers and lotions. Avoid triggers and irritants, and treat flare-ups quickly. This information is not intended to replace advice given to you by your health care provider. Make sure you discuss any questions you have with your health care provider. Document Revised: 10/22/2017 Document Reviewed: 03/25/2017 Elsevier Patient Education  [place].

## 2020-02-13 DIAGNOSIS — J302 Other seasonal allergic rhinitis: Secondary | ICD-10-CM | POA: Diagnosis not present

## 2020-02-13 DIAGNOSIS — Z9889 Other specified postprocedural states: Secondary | ICD-10-CM | POA: Diagnosis not present

## 2020-02-13 DIAGNOSIS — Z885 Allergy status to narcotic agent status: Secondary | ICD-10-CM | POA: Diagnosis not present

## 2020-02-13 DIAGNOSIS — Z79899 Other long term (current) drug therapy: Secondary | ICD-10-CM | POA: Diagnosis not present

## 2020-02-13 DIAGNOSIS — J301 Allergic rhinitis due to pollen: Secondary | ICD-10-CM | POA: Diagnosis not present

## 2020-02-13 DIAGNOSIS — J329 Chronic sinusitis, unspecified: Secondary | ICD-10-CM | POA: Diagnosis not present

## 2020-02-13 DIAGNOSIS — F17210 Nicotine dependence, cigarettes, uncomplicated: Secondary | ICD-10-CM | POA: Diagnosis not present

## 2020-02-13 DIAGNOSIS — J328 Other chronic sinusitis: Secondary | ICD-10-CM | POA: Diagnosis not present

## 2020-02-13 LAB — HEMOGLOBIN A1C
Hgb A1c MFr Bld: 6 % of total Hgb — ABNORMAL HIGH (ref <5.7)
Mean Plasma Glucose: 126 (calc)
eAG (mmol/L): 7 (calc)

## 2020-02-13 LAB — COMPLETE METABOLIC PANEL WITH GFR
AG Ratio: 1.5 (calc) (ref 1.0–2.5)
ALT: 12 U/L (ref 6–29)
AST: 15 U/L (ref 10–35)
Albumin: 4.4 g/dL (ref 3.6–5.1)
Alkaline phosphatase (APISO): 76 U/L (ref 37–153)
BUN: 8 mg/dL (ref 7–25)
CO2: 30 mmol/L (ref 20–32)
Calcium: 9.6 mg/dL (ref 8.6–10.4)
Chloride: 102 mmol/L (ref 98–110)
Creat: 0.59 mg/dL (ref 0.50–1.05)
GFR, Est African American: 118 mL/min/{1.73_m2} (ref >=60)
GFR, Est Non African American: 102 mL/min/{1.73_m2} (ref >=60)
Globulin: 3 g/dL (calc) (ref 1.9–3.7)
Glucose, Bld: 104 mg/dL — ABNORMAL HIGH (ref 65–99)
Potassium: 3.3 mmol/L — ABNORMAL LOW (ref 3.5–5.3)
Sodium: 141 mmol/L (ref 135–146)
Total Bilirubin: 0.4 mg/dL (ref 0.2–1.2)
Total Protein: 7.4 g/dL (ref 6.1–8.1)

## 2020-02-13 LAB — CBC WITH DIFFERENTIAL/PLATELET
Absolute Monocytes: 622 cells/uL (ref 200–950)
Basophils Absolute: 22 cells/uL (ref 0–200)
Basophils Relative: 0.4 %
Eosinophils Absolute: 112 cells/uL (ref 15–500)
Eosinophils Relative: 2 %
HCT: 42.9 % (ref 35.0–45.0)
Hemoglobin: 14.1 g/dL (ref 11.7–15.5)
Lymphs Abs: 1193 cells/uL (ref 850–3900)
MCH: 29 pg (ref 27.0–33.0)
MCHC: 32.9 g/dL (ref 32.0–36.0)
MCV: 88.3 fL (ref 80.0–100.0)
MPV: 10.8 fL (ref 7.5–12.5)
Monocytes Relative: 11.1 %
Neutro Abs: 3651 cells/uL (ref 1500–7800)
Neutrophils Relative %: 65.2 %
Platelets: 219 10*3/uL (ref 140–400)
RBC: 4.86 10*6/uL (ref 3.80–5.10)
RDW: 13.8 % (ref 11.0–15.0)
Total Lymphocyte: 21.3 %
WBC: 5.6 10*3/uL (ref 3.8–10.8)

## 2020-02-13 LAB — LIPID PANEL
Cholesterol: 196 mg/dL (ref <200)
HDL: 46 mg/dL — ABNORMAL LOW (ref >=50)
LDL Cholesterol (Calc): 127 mg/dL (calc) — ABNORMAL HIGH
Non-HDL Cholesterol (Calc): 150 mg/dL (calc) — ABNORMAL HIGH (ref <130)
Total CHOL/HDL Ratio: 4.3 (calc) (ref <5.0)
Triglycerides: 119 mg/dL (ref <150)

## 2020-02-13 LAB — CYTOLOGY - PAP: Diagnosis: NEGATIVE

## 2020-02-29 ENCOUNTER — Ambulatory Visit: Payer: Federal, State, Local not specified - PPO

## 2020-03-06 ENCOUNTER — Other Ambulatory Visit: Payer: Self-pay

## 2020-03-06 ENCOUNTER — Ambulatory Visit (INDEPENDENT_AMBULATORY_CARE_PROVIDER_SITE_OTHER): Payer: Federal, State, Local not specified - PPO

## 2020-03-06 DIAGNOSIS — Z1231 Encounter for screening mammogram for malignant neoplasm of breast: Secondary | ICD-10-CM | POA: Diagnosis not present

## 2020-03-06 DIAGNOSIS — Z0000 Encounter for general adult medical examination without abnormal findings: Secondary | ICD-10-CM

## 2020-03-07 ENCOUNTER — Other Ambulatory Visit: Payer: Self-pay | Admitting: Nurse Practitioner

## 2020-03-07 DIAGNOSIS — R928 Other abnormal and inconclusive findings on diagnostic imaging of breast: Secondary | ICD-10-CM

## 2020-03-13 ENCOUNTER — Other Ambulatory Visit: Payer: Self-pay | Admitting: Nurse Practitioner

## 2020-03-13 ENCOUNTER — Other Ambulatory Visit: Payer: Federal, State, Local not specified - PPO

## 2020-03-13 ENCOUNTER — Ambulatory Visit
Admission: RE | Admit: 2020-03-13 | Discharge: 2020-03-13 | Disposition: A | Discharge status: Home / Self Care | Payer: Federal, State, Local not specified - PPO | Source: Ambulatory Visit | Attending: Nurse Practitioner | Admitting: Nurse Practitioner

## 2020-03-13 ENCOUNTER — Other Ambulatory Visit: Payer: Self-pay

## 2020-03-13 DIAGNOSIS — R921 Mammographic calcification found on diagnostic imaging of breast: Secondary | ICD-10-CM | POA: Diagnosis not present

## 2020-03-13 DIAGNOSIS — R928 Other abnormal and inconclusive findings on diagnostic imaging of breast: Secondary | ICD-10-CM

## 2020-03-13 DIAGNOSIS — N6320 Unspecified lump in the left breast, unspecified quadrant: Secondary | ICD-10-CM

## 2020-03-13 DIAGNOSIS — N6489 Other specified disorders of breast: Secondary | ICD-10-CM | POA: Diagnosis not present

## 2020-03-13 DIAGNOSIS — N6313 Unspecified lump in the right breast, lower outer quadrant: Secondary | ICD-10-CM | POA: Diagnosis not present

## 2020-03-27 ENCOUNTER — Ambulatory Visit
Admission: RE | Admit: 2020-03-27 | Discharge: 2020-03-27 | Disposition: A | Discharge status: Home / Self Care | Payer: Federal, State, Local not specified - PPO | Source: Ambulatory Visit | Attending: Nurse Practitioner | Admitting: Nurse Practitioner

## 2020-03-27 ENCOUNTER — Other Ambulatory Visit: Payer: Self-pay

## 2020-03-27 DIAGNOSIS — N6489 Other specified disorders of breast: Secondary | ICD-10-CM | POA: Diagnosis not present

## 2020-03-27 DIAGNOSIS — N6320 Unspecified lump in the left breast, unspecified quadrant: Secondary | ICD-10-CM

## 2020-03-27 DIAGNOSIS — N6324 Unspecified lump in the left breast, lower inner quadrant: Secondary | ICD-10-CM | POA: Diagnosis not present

## 2020-03-27 DIAGNOSIS — N6321 Unspecified lump in the left breast, upper outer quadrant: Secondary | ICD-10-CM | POA: Diagnosis not present

## 2020-03-29 ENCOUNTER — Other Ambulatory Visit: Payer: Self-pay | Admitting: Nurse Practitioner

## 2020-04-01 ENCOUNTER — Other Ambulatory Visit: Payer: Self-pay | Admitting: Nurse Practitioner

## 2020-04-01 DIAGNOSIS — R928 Other abnormal and inconclusive findings on diagnostic imaging of breast: Secondary | ICD-10-CM

## 2020-04-01 NOTE — Progress Notes (Signed)
Referral for breast surgeon placed to Dr. Ponciano Ort with [facility] for evaluation and removal of complex sclerosing lesion with calcifications identified in the left breast at 1 o'clock position on mammogram. Needle core biopsy reveals immunohistochemistry for basal cell markers.

## 2020-04-08 ENCOUNTER — Other Ambulatory Visit: Payer: Self-pay | Admitting: Neurology

## 2020-04-08 DIAGNOSIS — N6320 Unspecified lump in the left breast, unspecified quadrant: Secondary | ICD-10-CM | POA: Diagnosis not present

## 2020-04-08 DIAGNOSIS — Z9189 Other specified personal risk factors, not elsewhere classified: Secondary | ICD-10-CM

## 2020-04-08 DIAGNOSIS — I10 Essential (primary) hypertension: Secondary | ICD-10-CM

## 2020-04-11 DIAGNOSIS — N6022 Fibroadenosis of left breast: Secondary | ICD-10-CM | POA: Diagnosis not present

## 2020-05-06 DIAGNOSIS — R928 Other abnormal and inconclusive findings on diagnostic imaging of breast: Secondary | ICD-10-CM | POA: Diagnosis not present

## 2020-05-06 DIAGNOSIS — Z885 Allergy status to narcotic agent status: Secondary | ICD-10-CM | POA: Diagnosis not present

## 2020-05-06 DIAGNOSIS — D0502 Lobular carcinoma in situ of left breast: Secondary | ICD-10-CM | POA: Diagnosis not present

## 2020-05-06 DIAGNOSIS — E669 Obesity, unspecified: Secondary | ICD-10-CM | POA: Diagnosis not present

## 2020-05-06 DIAGNOSIS — F17210 Nicotine dependence, cigarettes, uncomplicated: Secondary | ICD-10-CM | POA: Diagnosis not present

## 2020-05-06 DIAGNOSIS — N6489 Other specified disorders of breast: Secondary | ICD-10-CM | POA: Diagnosis not present

## 2020-05-06 DIAGNOSIS — F419 Anxiety disorder, unspecified: Secondary | ICD-10-CM | POA: Diagnosis not present

## 2020-05-06 DIAGNOSIS — Z79899 Other long term (current) drug therapy: Secondary | ICD-10-CM | POA: Diagnosis not present

## 2020-05-06 DIAGNOSIS — Z7982 Long term (current) use of aspirin: Secondary | ICD-10-CM | POA: Diagnosis not present

## 2020-05-06 DIAGNOSIS — C50912 Malignant neoplasm of unspecified site of left female breast: Secondary | ICD-10-CM | POA: Diagnosis not present

## 2020-05-06 DIAGNOSIS — I10 Essential (primary) hypertension: Secondary | ICD-10-CM | POA: Diagnosis not present

## 2020-05-06 DIAGNOSIS — R921 Mammographic calcification found on diagnostic imaging of breast: Secondary | ICD-10-CM | POA: Diagnosis not present

## 2020-05-06 DIAGNOSIS — N6320 Unspecified lump in the left breast, unspecified quadrant: Secondary | ICD-10-CM | POA: Diagnosis not present

## 2020-05-06 DIAGNOSIS — Z6833 Body mass index (BMI) 33.0-33.9, adult: Secondary | ICD-10-CM | POA: Diagnosis not present

## 2020-07-21 ENCOUNTER — Other Ambulatory Visit: Payer: Self-pay | Admitting: Nurse Practitioner

## 2020-07-21 DIAGNOSIS — I10 Essential (primary) hypertension: Secondary | ICD-10-CM

## 2020-07-21 DIAGNOSIS — E785 Hyperlipidemia, unspecified: Secondary | ICD-10-CM

## 2020-07-21 DIAGNOSIS — Z9189 Other specified personal risk factors, not elsewhere classified: Secondary | ICD-10-CM

## 2020-07-22 ENCOUNTER — Other Ambulatory Visit: Payer: Self-pay | Admitting: Nurse Practitioner

## 2020-07-22 DIAGNOSIS — I10 Essential (primary) hypertension: Secondary | ICD-10-CM

## 2020-07-22 DIAGNOSIS — Z9189 Other specified personal risk factors, not elsewhere classified: Secondary | ICD-10-CM

## 2020-07-31 DIAGNOSIS — K0889 Other specified disorders of teeth and supporting structures: Secondary | ICD-10-CM | POA: Diagnosis not present

## 2020-07-31 DIAGNOSIS — F17210 Nicotine dependence, cigarettes, uncomplicated: Secondary | ICD-10-CM | POA: Diagnosis not present

## 2020-07-31 DIAGNOSIS — I10 Essential (primary) hypertension: Secondary | ICD-10-CM | POA: Diagnosis not present

## 2020-07-31 DIAGNOSIS — E785 Hyperlipidemia, unspecified: Secondary | ICD-10-CM | POA: Diagnosis not present

## 2020-07-31 DIAGNOSIS — K047 Periapical abscess without sinus: Secondary | ICD-10-CM | POA: Diagnosis not present

## 2020-11-19 ENCOUNTER — Encounter: Payer: Self-pay | Admitting: Nurse Practitioner

## 2020-11-19 ENCOUNTER — Telehealth (INDEPENDENT_AMBULATORY_CARE_PROVIDER_SITE_OTHER): Payer: Federal, State, Local not specified - PPO | Admitting: Nurse Practitioner

## 2020-11-19 DIAGNOSIS — R0989 Other specified symptoms and signs involving the circulatory and respiratory systems: Secondary | ICD-10-CM

## 2020-11-19 DIAGNOSIS — R509 Fever, unspecified: Secondary | ICD-10-CM | POA: Diagnosis not present

## 2020-11-19 DIAGNOSIS — R059 Cough, unspecified: Secondary | ICD-10-CM

## 2020-11-19 MED ORDER — ALBUTEROL SULFATE HFA 108 (90 BASE) MCG/ACT IN AERS: 1.0000 | INHALATION_SPRAY | RESPIRATORY_TRACT | 1 refills | Status: DC | PRN

## 2020-11-19 MED ORDER — BENZONATATE 200 MG PO CAPS: 200.0000 mg | ORAL_CAPSULE | Freq: Three times a day (TID) | ORAL | 2 refills | Status: DC | PRN

## 2020-11-19 MED ORDER — PSEUDOEPHEDRINE-GUAIFENESIN ER 120-1200 MG PO TB12: 1.0000 | ORAL_TABLET | Freq: Two times a day (BID) | ORAL | 0 refills | Status: DC

## 2020-11-19 MED ORDER — PREDNISONE 20 MG PO TABS: 40.0000 mg | ORAL_TABLET | Freq: Every day | ORAL | 0 refills | Status: DC

## 2020-11-19 MED ORDER — AZITHROMYCIN 250 MG PO TABS: ORAL_TABLET | ORAL | 0 refills | Status: DC

## 2020-11-19 NOTE — Patient Instructions (Signed)
Over the counter medications that may be helpful for symptoms:  Guaifenesin 1200 mg extended release tabs twice daily, with plenty of water o For cough and congestion o Brand name: Mucinex    Pseudoephedrine 30 mg, one or two tabs every 4 to 6 hours o For sinus congestion o Brand name: Sudafed o You must get this from the pharmacy counter.   Oxymetazoline nasal spray each morning, one spray in each nostril, for NO MORE THAN 3 days  o For nasal and sinus congestion o Brand name: Afrin  Saline nasal spray or Saline Nasal Irrigation 3-5 times a day o For nasal and sinus congestion o Brand names: Ocean or AYR  Fluticasone nasal spray, one spray in each nostril, each morning after oxymetazoline and saline, if used o For nasal and sinus congestion o Brand name: Flonase  Warm salt water gargles  o For sore throat o Every few hours as needed  Alternate ibuprofen 400-600 mg and acetaminophen 1000 mg every 4-6 hours o For fever, body aches, headache o Brand names: Motrin or Advil and Tylenol  Dextromethorphan 12-hour cough version 30 mg every 12 hours  o For cough o Brand name: Delsym Stop all other cold medications for now (Nyquil, Dayquil, Tylenol Cold, Theraflu, etc) and other non-prescription cough/cold preparations. Many of these have the same ingredients listed above and could cause an overdose of medication.   General Instructions  Allow your body to rest  Drink PLENTY of fluids  Isolate yourself from everyone, even family, until test results have returned   If your COVID-19 test is positive  Then you ARE INFECTED and you can pass the virus to others  You must quarantine from others for a minimum of  o 10 days since symptoms started AND o You are fever free for 24 hours WITHOUT any medication to reduce fever AND o Your symptoms are improving  Do not go to the store or other public areas  Do not go around household members who are not known to be infected with  COVID-19  If you MUST leave you area of quarantine (example: go to a bathroom you share with others in your home), you must o Wear a mask o Wash your hands thoroughly o Wipe down any surfaces you touch  Do not share food, drinks, towels, or other items with other persons  Dispose of your own tissues, food containers, etc  Once you have recovered, please continue good preventive care measures, including:   wearing a mask when in public  wash your hands frequently  avoid touching your face/nose/eyes  cover coughs/sneezes with the inside of your elbow  stay out of crowds  keep a 6 foot distance from others  Go to the nearest hospital emergency room if fever/cough/breathlessness are severe or illness seems like a threat to life.   If you begin to feel better and are fever free, you may return to work if your COVID test is negative at work. Be sure to keep yourself safe by wearing a mask and keeping your hands clean so that you do not get sick with something else while your immune system is down.   Please let me know if your symptoms worsen or do not get better.   SaraBeth

## 2020-11-19 NOTE — Progress Notes (Signed)
Virtual Visit via Telephone Note  I connected with  Konya Sifford-Chalk on 11/19/20 at  2:50 PM EST by telephone and verified that I am speaking with the correct person using two identifiers.   I discussed the limitations, risks, security and privacy concerns of performing an evaluation and management service by telephone and the availability of in person appointments. I also discussed with the patient that there may be a patient responsible charge related to this service. The patient expressed understanding and agreed to proceed.  Participating parties included in this telephone visit include: The patient and nurse practitioner listed The patient is: at home I am: in the office  Subjective:    CC: Chest congestion, fever, chills, fatigue, headache, PND  HPI: Natalya Domzalski is a 59 year old female presenting today for symptoms of chest congestion, cough, fever, chills, severe fatigue, headache, and post nasal drip that started at the end of last week. She reports her worst symptom is her chest congestion. She is cough up yellow thick mucous and endorses wheezing. She also endorses loss of appetite today.  She has been taking Nyquil, nasal spray, and honey cough syrup with little relief.   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 She endorses proper PPE use while working with patients. She has had her flu vaccine and COVID vaccines, but has not had her booster. Her last COVID test was Tuesday of last week and it was negative at that time, but she did not have symptoms.   She denies sinus pain and pressure, loss of taste, loss of smell, shortness of breath at rest.   Past medical history, Surgical history, Family history not pertinant except as noted below, Social history, Allergies, and medications have been entered into the medical record, reviewed, and corrections made.   Review of Systems:  All review of systems negative except what is listed in the  HPI  Objective:    General: Speaking clearly in complete sentences without any shortness of breath. She is audibly congested and weak sounding.  Alert and oriented x3.   Normal judgment.  No apparent acute distress.  Impression and Recommendations:    1. Cough 2. Chest congestion 3. Fever, unspecified fever cause Symptoms consistent with upper respiratory illness of unknown etiology.  Based on the amount of congestion and purulent mucous I feel that it is appropriate to begin treatment with azithromycin and prednisone today.  Strongly encourage flu and COIVD testing as soon as possible and recommend that she avoid returning to work until her symptoms have resolved. She will be tested for COVID upon returning to work at a minimum.  Work note provided for patient today.  Recommend OTC medications that can be helpful for symptoms and provided print out of AVS to be mailed to patient with work note.  Increase hydration and rest as much as possible. Supportive care with OTC and prescription medications.  Call the office in the morning if her symptoms are worsening or if she is unable to get COVID/flu testing sooner and we will have her come in for testing.  Follow-up if symptoms worsen or fail to improve.  - benzonatate (TESSALON) 200 MG capsule; Take 1 capsule (200 mg total) by mouth 3 (three) times daily as needed for cough.  Dispense: 30 capsule; Refill: 2 - Pseudoephedrine-Guaifenesin (MUCINEX D MAX STRENGTH) [phone] MG TB12; Take 1 tablet by mouth in the morning and at bedtime.  Dispense: 20 tablet; Refill: 0 - albuterol (  VENTOLIN HFA) 108 (90 Base) MCG/ACT inhaler; Inhale 1-2 puffs into the lungs every 4 (four) hours as needed for wheezing or shortness of breath.  Dispense: 6.7 g; Refill: 1 - predniSONE (DELTASONE) 20 MG tablet; Take 2 tablets (40 mg total) by mouth daily with breakfast.  Dispense: 10 tablet; Refill: 0 - azithromycin (ZITHROMAX) 250 MG tablet; Take 2 tabs (500 mg)  together on the first day, then 1 tab (250 mg) daily until prescription complete.  Dispense: 10 tablet; Refill: 0      I discussed the assessment and treatment plan with the patient. The patient was provided an opportunity to ask questions and all were answered. The patient agreed with the plan and demonstrated an understanding of the instructions.   The patient was advised to call back or seek an in-person evaluation if the symptoms worsen or if the condition fails to improve as anticipated.  I provided 20 minutes of non-face-to-face time during this TELEPHONE encounter.    Orma Render, NP

## 2020-11-20 ENCOUNTER — Telehealth: Payer: Self-pay

## 2020-11-20 NOTE — Telephone Encounter (Signed)
Pt called and LVM wanting to let [place] Pou know that she is going to get tested for COVID this morning at her work.

## 2020-11-26 NOTE — Telephone Encounter (Signed)
Pt called back and LVM stating that her COVID test resulted negative, but 2 people that are in her household tested positive. She states she is still feeling feverish and has decreased energy. She has finished the medication that was prescribed to her on 11/19/2020. Pt would like to know what to do next.

## 2021-01-17 ENCOUNTER — Other Ambulatory Visit: Payer: Self-pay | Admitting: Nurse Practitioner

## 2021-01-17 DIAGNOSIS — E785 Hyperlipidemia, unspecified: Secondary | ICD-10-CM

## 2021-01-17 DIAGNOSIS — Z9189 Other specified personal risk factors, not elsewhere classified: Secondary | ICD-10-CM

## 2021-01-24 ENCOUNTER — Ambulatory Visit (INDEPENDENT_AMBULATORY_CARE_PROVIDER_SITE_OTHER): Payer: Federal, State, Local not specified - PPO | Admitting: Family Medicine

## 2021-01-24 ENCOUNTER — Other Ambulatory Visit: Payer: Self-pay

## 2021-01-24 ENCOUNTER — Encounter: Payer: Self-pay | Admitting: Family Medicine

## 2021-01-24 VITALS — BP 136/88 | HR 86 | Wt 198.7 lb

## 2021-01-24 DIAGNOSIS — S39012A Strain of muscle, fascia and tendon of lower back, initial encounter: Secondary | ICD-10-CM | POA: Diagnosis not present

## 2021-01-24 DIAGNOSIS — M545 Low back pain, unspecified: Secondary | ICD-10-CM | POA: Diagnosis not present

## 2021-01-24 MED ORDER — PREDNISONE 20 MG PO TABS: 40.0000 mg | ORAL_TABLET | Freq: Every day | ORAL | 0 refills | Status: AC

## 2021-01-24 NOTE — Progress Notes (Addendum)
Acute Office Visit  Subjective:    Patient ID: Hailey Robinson, female    DOB: [birth year], 60 y.o.   MRN: [id number]  CC: acute low back pain, left   HPI Patient is in today for back pain.  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 She went to occupational health at work the next day and they told her it was a strain. They wanted her to return to work today without restrictions, but she has a very labor intensive job and didn't feel like she could go back this soon due to the pain - she tried rest, heat, muscle relaxer, and naproxen but not much improvement.  BACK PAIN Duration: days Mechanism of injury: lifting Location: Left Onset: sudden Severity: 8/10 Quality: stabbing and throbbing Frequency: constant Radiation: L leg below the knee Aggravating factors: lifting, movement, laying and bending Alleviating factors: NSAIDs and muscle relaxer - brings her to a 6/10 at rest Status: stable Treatments attempted: rest, heat and aleve  Relief with NSAIDs?: mild Nighttime pain:  yes Paresthesias / decreased sensation:  yes - numbness/tingling radiating down to left foot Bowel / bladder incontinence:  no Fevers:  no Dysuria / urinary frequency:  no   Back Exam:    Inspection:  Normal spinal curvature.  No deformity, ecchymosis, erythema, or lesions   Curvature: Normal   Deformity: no  Ecchymosis: no none  Erythema:  no none  Lesions: no    Palpation:     Midline spinal tenderness: no none    Paralumbar tenderness: yes Left     Parathoracic tenderness: no no     Buttocks tenderness: nono     Range of Motion:      Flexion: Fingers to Knees     Extension:Decreased     Lateral bending:Decreased    Rotation:Decreased    Neuro Exam:Lower extremity DTRs normal & symmetric.  Strength and sensation intact.     Patellar DTRs: Normal     Ankle  dorsiflexion strength:Within Normal Limits     Sensation(medial malleolus):Within Normal Limits     Great toe dorsiflexion strength: Within Normal Limits     Sensation (mid dorsal foot):Within Normal Limits     Ankle plantar flexion strength:Within Normal Limits     Sensation (lateral heel):Within Normal Limits        Past Medical History:  Diagnosis Date  . Anemia   . Anemia 02/03/2017  . Arthritis    "think I have some in my lower back" (02/10/2017)  . Chronic bronchitis ([place]) 03/29/2019  . Cognitive change 03/28/2019  . Colitis 02/14/2019  . Degenerative arthritis of cervical spine 03/29/2019  . Elevated C-reactive protein (CRP) 03/29/2019  . Encounter for smoking cessation counseling 05/26/2018  . 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 Fatigue 03/28/2019  . Functional systolic murmur    normal ECHO 07/2016  . GERD (gastroesophageal reflux disease)    spicy foods  . Grade I internal hemorrhoids 04/13/2017   Repeat colonoscopy in 10 years  . Hypertension   . Iron deficiency 03/29/2019  . Left thyroid nodule 11/04/2016  . 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 Leukopenia 05/26/2018  . New daily persistent headache 05/05/2018  . Perimenopause 11/04/2016  . Pilonidal sinus 11/04/2016  . Pneumonia 10/2016  . Prediabetes 03/29/2019  . Seasonal allergies   . Subacute sphenoidal sinusitis 07/05/2019  .  Unilateral vocal cord paralysis   . Weakness of left upper extremity 03/28/2019    Past Surgical History:  Procedure Laterality Date  . BIOPSY THYROID  12/2016 X 2  . LACERATION REPAIR Right 1985   pinky  . THYROID LOBECTOMY Left 02/10/2017  . THYROID LOBECTOMY Left 02/10/2017   Procedure: LEFT THYROID LOBECTOMY;  Surgeon: Ralene Ok, MD;  Location: [facility];  Service: General;  Laterality: Left;  . TUBAL LIGATION      Family History  Problem Relation Age of Onset  . Hyperlipidemia Mother   . Hypertension Mother   . Hypertension Father   . Hyperlipidemia Father   . Thyroid disease Sister      Social History   Socioeconomic History  . Marital status: Married    Spouse name: Not on file  . Number of children: Not on file  . Years of education: Not on file  . Highest education level: Not on file  Occupational History  . Not on file  Tobacco Use  . Smoking status: Current Every Day Smoker    Packs/day: 0.50    Years: 15.00    Pack years: 7.50    Types: Cigarettes  . Smokeless tobacco: Never Used  . Tobacco comment: As per pt - smokes 10 cigs a day  Substance and Sexual Activity  . Alcohol use: Yes    Alcohol/week: 2.0 standard drinks    Types: 2 Cans of beer per week    Comment: "weekends only"  . Drug use: No  . Sexual activity: Yes  Other Topics Concern  . Not on file  Social History Narrative  . Not on file   Social Determinants of Health   Financial Resource Strain: Not on file  Food Insecurity: Not on file  Transportation Needs: Not on file  Physical Activity: Not on file  Stress: Not on file  Social Connections: Not on file  Intimate Partner Violence: Not on file    Outpatient Medications Prior to Visit  Medication Sig Dispense Refill  . albuterol (VENTOLIN HFA) 108 (90 Base) MCG/ACT inhaler Inhale 1-2 puffs into the lungs every 4 (four) hours as needed for wheezing or shortness of breath. 6.7 g 1  . amLODipine (NORVASC) 5 MG tablet TAKE 1 TABLET(5 MG) BY MOUTH DAILY 30 tablet 5  . aspirin EC 81 MG tablet Take 1 tablet (81 mg total) by mouth daily. 90 tablet 3  . fexofenadine (ALLEGRA) 180 MG tablet Take 180 mg by mouth daily.    . fluticasone (FLONASE) 50 MCG/ACT nasal spray Place 1 spray into both nostrils daily. 16 g 0  . hydrochlorothiazide (HYDRODIURIL) 25 MG tablet TAKE 1 TABLET(25 MG) BY MOUTH DAILY 30 tablet 5  . potassium chloride SA (KLOR-CON) 20 MEQ tablet Take 1 tablet (20 mEq total) by mouth 2 (two) times daily. 180 tablet 3  . Pseudoephedrine-Guaifenesin (MUCINEX D MAX STRENGTH) [phone] MG TB12 Take 1 tablet by mouth in the morning  and at bedtime. 20 tablet 0  . atorvastatin (LIPITOR) 10 MG tablet TAKE 1 TABLET(10 MG) BY MOUTH DAILY 30 tablet 5  . azithromycin (ZITHROMAX) 250 MG tablet Take 2 tabs (500 mg) together on the first day, then 1 tab (250 mg) daily until prescription complete. 10 tablet 0  . benzonatate (TESSALON) 200 MG capsule Take 1 capsule (200 mg total) by mouth 3 (three) times daily as needed for cough. 30 capsule 2  . doxepin (SINEQUAN) 25 MG capsule Take 1 capsule (25 mg total) by mouth at  bedtime as needed (sleep/anxiety). 90 capsule 3  . predniSONE (DELTASONE) 20 MG tablet Take 2 tablets (40 mg total) by mouth daily with breakfast. 10 tablet 0   No facility-administered medications prior to visit.    Allergies  Allergen Reactions  . Codeine Hives and Itching    Review of Systems All review of systems negative except what is listed in the HPI     Objective:    Physical Exam Vitals reviewed.  Constitutional:      Appearance: Normal appearance.  Musculoskeletal:     Cervical back: Normal range of motion and neck supple.  Skin:    General: Skin is warm and dry.  Neurological:     General: No focal deficit present.     Mental Status: She is alert and oriented to person, place, and time.  Psychiatric:        Mood and Affect: Mood normal.        Behavior: Behavior normal.        Thought Content: Thought content normal.        Judgment: Judgment normal.    Back Exam:    Inspection:  Normal spinal curvature.  No deformity, ecchymosis, erythema, or lesions   Curvature: Normal   Deformity: no  Ecchymosis: no none  Erythema:  no none  Lesions: no    Palpation:     Midline spinal tenderness: no none    Paralumbar tenderness: yes Left     Parathoracic tenderness: no no     Buttocks tenderness: nono     Range of Motion:      Flexion: Fingers to Knees     Extension:Decreased     Lateral bending:Decreased    Rotation:Decreased    Neuro Exam:Lower extremity DTRs normal & symmetric.   Strength and sensation intact.     Patellar DTRs: Normal     Ankle dorsiflexion strength:Within Normal Limits     Sensation(medial malleolus):Within Normal Limits     Great toe dorsiflexion strength: Within Normal Limits     Sensation (mid dorsal foot):Within Normal Limits     Ankle plantar flexion strength:Within Normal Limits     Sensation (lateral heel):Within Normal Limits   BP 136/88   Pulse 86   Wt 198 lb 11.2 oz (90.1 kg)   SpO2 98%   BMI 32.07 kg/m  Wt Readings from Last 3 Encounters:  01/24/21 198 lb 11.2 oz (90.1 kg)  02/12/20 201 lb (91.2 kg)  04/21/19 184 lb (83.5 kg)    Health Maintenance Due  Topic Date Due  . COVID-19 Vaccine (3 - Booster for Pfizer series) 08/11/2020    There are no preventive care reminders to display for this patient.   Lab Results  Component Value Date   TSH 1.53 06/17/2017   Lab Results  Component Value Date   WBC 5.6 02/12/2020   HGB 14.1 02/12/2020   HCT 42.9 02/12/2020   MCV 88.3 02/12/2020   PLT 219 02/12/2020   Lab Results  Component Value Date   NA 141 02/12/2020   K 3.3 (L) 02/12/2020   CO2 30 02/12/2020   GLUCOSE 104 (H) 02/12/2020   BUN 8 02/12/2020   CREATININE 0.59 02/12/2020   BILITOT 0.4 02/12/2020   ALKPHOS 77 11/04/2016   AST 15 02/12/2020   ALT 12 02/12/2020   PROT 7.4 02/12/2020   ALBUMIN 4.1 11/04/2016   CALCIUM 9.6 02/12/2020   ANIONGAP 12 02/10/2017   Lab Results  Component Value Date   CHOL 196  02/12/2020   Lab Results  Component Value Date   HDL 46 (L) 02/12/2020   Lab Results  Component Value Date   LDLCALC 127 (H) 02/12/2020   Lab Results  Component Value Date   TRIG 119 02/12/2020   Lab Results  Component Value Date   CHOLHDL 4.3 02/12/2020   Lab Results  Component Value Date   HGBA1C 6.0 (H) 02/12/2020       Assessment & Plan:   1. Strain of muscle, fascia and tendon of lower back, initial encounter  Starting with conservative management for low back pain/strain.  Given her level of pain, will go ahead and do a short course of prednisone (instructed to hold off on NSAIDs until the steroid is finished, can take tylenol in the meantime and continue PRN muscle relaxer given by Occupational Health). Given exercise/stretches to try at home. Encouraged heating pad, rest, stretching and exercises as described in handouts provided. She doesn't have a good option for light-duty at work, so I will give her a note for this weekend, and she is scheduled to follow-up with Occupational Health next Wednesday. She said that after she sees how this week goes, she will call back and schedule a follow-up appointment for about 5-6 weeks from today. If no improvement by that time, we can do x-rays.  - predniSONE (DELTASONE) 20 MG tablet; Take 2 tablets (40 mg total) by mouth daily with breakfast for 5 days.  Dispense: 10 tablet; Refill: 0    Terrilyn Saver, NP

## 2021-01-24 NOTE — Patient Instructions (Signed)
Acute Back Pain, Adult Acute back pain is sudden and usually short-lived. It is often caused by an injury to the muscles and tissues in the back. The injury may result from:  A muscle or ligament getting overstretched or torn (strained). Ligaments are tissues that connect bones to each other. Lifting something improperly can cause a back strain.  Wear and tear (degeneration) of the spinal disks. Spinal disks are circular tissue that provide cushioning between the bones of the spine (vertebrae).  Twisting motions, such as while playing sports or doing yard work.  A hit to the back.  Arthritis. You may have a physical exam, lab tests, and imaging tests to find the cause of your pain. Acute back pain usually goes away with rest and home care. Follow these instructions at home: Managing pain, stiffness, and swelling  Treatment may include medicines for pain and inflammation that are taken by mouth or applied to the skin, prescription pain medicine, or muscle relaxants. Take over-the-counter and prescription medicines only as told by your health care provider.  Your health care provider may recommend applying ice during the first 24-48 hours after your pain starts. To do this: ? Put ice in a plastic bag. ? Place a towel between your skin and the bag. ? Leave the ice on for 20 minutes, 2-3 times a day.  If directed, apply heat to the affected area as often as told by your health care provider. Use the heat source that your health care provider recommends, such as a moist heat pack or a heating pad. ? Place a towel between your skin and the heat source. ? Leave the heat on for 20-30 minutes. ? Remove the heat if your skin turns bright red. This is especially important if you are unable to feel pain, heat, or cold. You have a greater risk of getting burned. Activity  Do not stay in bed. Staying in bed for more than 1-2 days can delay your recovery.  Sit up and stand up straight. Avoid leaning  forward when you sit or hunching over when you stand. ? If you work at a desk, sit close to it so you do not need to lean over. Keep your chin tucked in. Keep your neck drawn back, and keep your elbows bent at a 90-degree angle (right angle). ? Sit high and close to the steering wheel when you drive. Add lower back (lumbar) support to your car seat, if needed.  Take short walks on even surfaces as soon as you are able. Try to increase the length of time you walk each day.  Do not sit, drive, or stand in one place for more than 30 minutes at a time. Sitting or standing for long periods of time can put stress on your back.  Do not drive or use heavy machinery while taking prescription pain medicine.  Use proper lifting techniques. When you bend and lift, use positions that put less stress on your back: ? Bend your knees. ? Keep the load close to your body. ? Avoid twisting.  Exercise regularly as told by your health care provider. Exercising helps your back heal faster and helps prevent back injuries by keeping muscles strong and flexible.  Work with a physical therapist to make a safe exercise program, as recommended by your health care provider. Do any exercises as told by your physical therapist.   Lifestyle  Maintain a healthy weight. Extra weight puts stress on your back and makes it difficult to have   good posture.  Avoid activities or situations that make you feel anxious or stressed. Stress and anxiety increase muscle tension and can make back pain worse. Learn ways to manage anxiety and stress, such as through exercise. General instructions  Sleep on a firm mattress in a comfortable position. Try lying on your side with your knees slightly bent. If you lie on your back, put a pillow under your knees.  Follow your treatment plan as told by your health care provider. This may include: ? Cognitive or behavioral therapy. ? Acupuncture or massage therapy. ? Meditation or yoga. Contact  a health care provider if:  You have pain that is not relieved with rest or medicine.  You have increasing pain going down into your legs or buttocks.  Your pain does not improve after 2 weeks.  You have pain at night.  You lose weight without trying.  You have a fever or chills. Get help right away if:  You develop new bowel or bladder control problems.  You have unusual weakness or numbness in your arms or legs.  You develop nausea or vomiting.  You develop abdominal pain.  You feel faint. Summary  Acute back pain is sudden and usually short-lived.  Use proper lifting techniques. When you bend and lift, use positions that put less stress on your back.  Take over-the-counter and prescription medicines and apply heat or ice as directed by your health care provider. This information is not intended to replace advice given to you by your health care provider. Make sure you discuss any questions you have with your health care provider. Document Revised: 08/02/2020 Document Reviewed: 08/02/2020 Elsevier Patient Education  2021 Elsevier Inc.  

## 2021-02-05 ENCOUNTER — Other Ambulatory Visit: Payer: Self-pay | Admitting: Neurology

## 2021-02-05 DIAGNOSIS — G479 Sleep disorder, unspecified: Secondary | ICD-10-CM

## 2021-02-05 MED ORDER — DOXEPIN HCL 25 MG PO CAPS: 25.0000 mg | ORAL_CAPSULE | Freq: Every evening | ORAL | 1 refills | Status: DC | PRN

## 2021-02-05 NOTE — Telephone Encounter (Signed)
See note from Clarise Cruz - can we call patient and see if she wants to switch providers here or see her at new practice?

## 2021-02-05 NOTE — Telephone Encounter (Signed)
Received request for patient's Doxepin. Forwarding to PCP.  Last filled one year ago.

## 2021-02-05 NOTE — Telephone Encounter (Signed)
Patient scheduled to switch care to [facility] on 02/17/21. AM

## 2021-02-05 NOTE — Telephone Encounter (Signed)
Refill provided for 90 days with one refill. Please let patient know she will need to establish care with new provider at current practice or transfer to PCP new practice to establish [phone]) if she wishes.

## 2021-02-06 ENCOUNTER — Telehealth: Payer: Self-pay | Admitting: Nurse Practitioner

## 2021-02-06 NOTE — Telephone Encounter (Signed)
Patient dropped off [employer] comp paperwork information. Scanned into patients chart and placed a copy in top of providers box. AM

## 2021-02-07 NOTE — Telephone Encounter (Signed)
I have removed you as PCP until she comes in to establish care with you. Thank you so much! AM

## 2021-02-07 NOTE — Telephone Encounter (Signed)
I've never seen this patient so not sure why I'm listed as PCP. She has an upcoming appointment with me according to her appointment desk. Lovena Le saw her on 3/4 so paperwork given to her for review.

## 2021-02-16 NOTE — Progress Notes (Signed)
Subjective:    CC:   HPI:   I reviewed the past medical history, family history, social history, surgical history, and allergies today and no changes were needed.  Please see the problem list section below in epic for further details.  Past Medical History: Past Medical History:  Diagnosis Date  . Anemia   . Anemia 02/03/2017  . Arthritis    "think I have some in my lower back" (02/10/2017)  . Chronic bronchitis ([place]) 03/29/2019  . Cognitive change 03/28/2019  . Colitis 02/14/2019  . Degenerative arthritis of cervical spine 03/29/2019  . Elevated C-reactive protein (CRP) 03/29/2019  . Encounter for smoking cessation counseling 05/26/2018  . 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 Fatigue 03/28/2019  . Functional systolic murmur    normal ECHO 07/2016  . GERD (gastroesophageal reflux disease)    spicy foods  . Grade I internal hemorrhoids 04/13/2017   Repeat colonoscopy in 10 years  . Hypertension   . Iron deficiency 03/29/2019  . Left thyroid nodule 11/04/2016  . 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 Leukopenia 05/26/2018  . New daily persistent headache 05/05/2018  . Perimenopause 11/04/2016  . Pilonidal sinus 11/04/2016  . Pneumonia 10/2016  . Prediabetes 03/29/2019  . Seasonal allergies   . Subacute sphenoidal sinusitis 07/05/2019  . Unilateral vocal cord paralysis   . Weakness of left upper extremity 03/28/2019   Past Surgical History: Past Surgical History:  Procedure Laterality Date  . BIOPSY THYROID  12/2016 X 2  . LACERATION REPAIR Right 1985   pinky  . THYROID LOBECTOMY Left 02/10/2017  . THYROID LOBECTOMY Left 02/10/2017   Procedure: LEFT THYROID LOBECTOMY;  Surgeon: Ralene Ok, MD;  Location: [facility];  Service: General;  Laterality: Left;  . TUBAL LIGATION     Social History: Social History   Socioeconomic History  . Marital status: Married    Spouse name: Not on file  . Number of children: Not on file  . Years of education: Not on file  . Highest education level: Not on  file  Occupational History  . Not on file  Tobacco Use  . Smoking status: Current Every Day Smoker    Packs/day: 0.50    Years: 15.00    Pack years: 7.50    Types: Cigarettes  . Smokeless tobacco: Never Used  . Tobacco comment: As per pt - smokes 10 cigs a day  Substance and Sexual Activity  . Alcohol use: Yes    Alcohol/week: 2.0 standard drinks    Types: 2 Cans of beer per week    Comment: "weekends only"  . Drug use: No  . Sexual activity: Yes  Other Topics Concern  . Not on file  Social History Narrative  . Not on file   Social Determinants of Health   Financial Resource Strain: Not on file  Food Insecurity: Not on file  Transportation Needs: Not on file  Physical Activity: Not on file  Stress: Not on file  Social Connections: Not on file   Family History: Family History  Problem Relation Age of Onset  . Hyperlipidemia Mother   . Hypertension Mother   . Hypertension Father   . Hyperlipidemia Father   . Thyroid disease Sister    Allergies: Allergies  Allergen Reactions  . Codeine Hives and Itching   Medications: See med rec.  Review of Systems: See HPI for pertinent positives and negatives.   Objective:    General: Well Developed, well nourished, and in no acute distress.  Neuro: Alert and  oriented x3, extra-ocular muscles intact, sensation grossly intact.  HEENT: Normocephalic, atraumatic, pupils equal round reactive to light, neck supple, no masses, no lymphadenopathy, thyroid nonpalpable.  Skin: Warm and dry, no rashes. Cardiac: Regular rate and rhythm, no murmurs rubs or gallops, no lower extremity edema.  Respiratory: Clear to auscultation bilaterally. Not using accessory muscles, speaking in full sentences.   Impression and Recommendations:    1. Encounter to establish care Reviewed available information and discussed health concerns with patient.   2. Essential hypertension Checking CBC and CMP. Continue Amlodipine 5mg  and HCTZ 25mg  daily.    3. Dyslipidemia, goal LDL below 70 Checking lipid panel. Continue Atorvastatin 10mg  daily.   4. Prediabetes Checking HgbA1c.   5. Hypokalemia due to excessive renal loss of potassium Checking potassium level. Continue Klor-Con 52mEq BID  6. Chronic bronchitis, unspecified chronic bronchitis type (HCC) Continue Allegra, flonase, and albuterol as prescribed.   7. Cigarette nicotine dependence without complication Discussed smoking cessation.   8. H/O Graves' disease 9. Status post partial thyroidectomy Checking TSH with free T4.   No follow-ups on file. ___________________________________________ Clearnce Sorrel, DNP, APRN, FNP-BC [facility]

## 2021-02-17 ENCOUNTER — Ambulatory Visit (INDEPENDENT_AMBULATORY_CARE_PROVIDER_SITE_OTHER): Payer: Federal, State, Local not specified - PPO | Admitting: Medical-Surgical

## 2021-02-17 DIAGNOSIS — Z7689 Persons encountering health services in other specified circumstances: Secondary | ICD-10-CM

## 2021-02-17 DIAGNOSIS — E785 Hyperlipidemia, unspecified: Secondary | ICD-10-CM

## 2021-02-17 DIAGNOSIS — Z5329 Procedure and treatment not carried out because of patient's decision for other reasons: Secondary | ICD-10-CM

## 2021-02-17 DIAGNOSIS — E876 Hypokalemia: Secondary | ICD-10-CM

## 2021-02-17 DIAGNOSIS — F17210 Nicotine dependence, cigarettes, uncomplicated: Secondary | ICD-10-CM

## 2021-02-17 DIAGNOSIS — R7303 Prediabetes: Secondary | ICD-10-CM

## 2021-02-17 DIAGNOSIS — E890 Postprocedural hypothyroidism: Secondary | ICD-10-CM

## 2021-02-17 DIAGNOSIS — J42 Unspecified chronic bronchitis: Secondary | ICD-10-CM

## 2021-02-17 DIAGNOSIS — I10 Essential (primary) hypertension: Secondary | ICD-10-CM

## 2021-02-17 DIAGNOSIS — Z8639 Personal history of other endocrine, nutritional and metabolic disease: Secondary | ICD-10-CM

## 2021-03-05 ENCOUNTER — Other Ambulatory Visit: Payer: Self-pay | Admitting: Nurse Practitioner

## 2021-03-05 DIAGNOSIS — Z9189 Other specified personal risk factors, not elsewhere classified: Secondary | ICD-10-CM

## 2021-03-05 DIAGNOSIS — E785 Hyperlipidemia, unspecified: Secondary | ICD-10-CM

## 2021-03-10 NOTE — Telephone Encounter (Signed)
Follow-up with labs needed for evaluation of medication, liver, and kidney functions. Please call patient and schedule new patient visit to discuss this and collect labs same day. Atorvastatin refills provided.

## 2021-03-11 ENCOUNTER — Other Ambulatory Visit: Payer: Self-pay | Admitting: Nurse Practitioner

## 2021-03-11 DIAGNOSIS — R509 Fever, unspecified: Secondary | ICD-10-CM

## 2021-03-11 DIAGNOSIS — R059 Cough, unspecified: Secondary | ICD-10-CM

## 2021-03-11 DIAGNOSIS — S39012A Strain of muscle, fascia and tendon of lower back, initial encounter: Secondary | ICD-10-CM | POA: Insufficient documentation

## 2021-03-11 DIAGNOSIS — R0989 Other specified symptoms and signs involving the circulatory and respiratory systems: Secondary | ICD-10-CM

## 2021-03-14 ENCOUNTER — Other Ambulatory Visit: Payer: Self-pay

## 2021-03-14 ENCOUNTER — Ambulatory Visit (INDEPENDENT_AMBULATORY_CARE_PROVIDER_SITE_OTHER): Payer: Federal, State, Local not specified - PPO | Admitting: Medical-Surgical

## 2021-03-14 ENCOUNTER — Encounter: Payer: Self-pay | Admitting: Medical-Surgical

## 2021-03-14 VITALS — BP 117/77 | HR 94 | Temp 98.6°F | Ht 64.75 in | Wt 197.4 lb

## 2021-03-14 DIAGNOSIS — E785 Hyperlipidemia, unspecified: Secondary | ICD-10-CM | POA: Diagnosis not present

## 2021-03-14 DIAGNOSIS — I10 Essential (primary) hypertension: Secondary | ICD-10-CM | POA: Diagnosis not present

## 2021-03-14 DIAGNOSIS — Z8639 Personal history of other endocrine, nutritional and metabolic disease: Secondary | ICD-10-CM | POA: Diagnosis not present

## 2021-03-14 DIAGNOSIS — F5101 Primary insomnia: Secondary | ICD-10-CM

## 2021-03-14 DIAGNOSIS — Z1231 Encounter for screening mammogram for malignant neoplasm of breast: Secondary | ICD-10-CM

## 2021-03-14 DIAGNOSIS — Z0000 Encounter for general adult medical examination without abnormal findings: Secondary | ICD-10-CM

## 2021-03-14 DIAGNOSIS — E890 Postprocedural hypothyroidism: Secondary | ICD-10-CM | POA: Diagnosis not present

## 2021-03-14 DIAGNOSIS — J302 Other seasonal allergic rhinitis: Secondary | ICD-10-CM

## 2021-03-14 MED ORDER — METHOCARBAMOL 500 MG PO TABS: 500.0000 mg | ORAL_TABLET | Freq: Three times a day (TID) | ORAL | 3 refills | Status: DC

## 2021-03-14 MED ORDER — BENZONATATE 200 MG PO CAPS: 200.0000 mg | ORAL_CAPSULE | Freq: Three times a day (TID) | ORAL | 0 refills | Status: DC | PRN

## 2021-03-14 MED ORDER — HYDROXYZINE HCL 50 MG PO TABS: 25.0000 mg | ORAL_TABLET | Freq: Every day | ORAL | 3 refills | Status: DC

## 2021-03-14 NOTE — Patient Instructions (Signed)
Preventive Care 59-59 Years Old, Female Preventive care refers to lifestyle choices and visits with your health care provider that can promote health and wellness. This includes:  A yearly physical exam. This is also called an annual wellness visit.  Regular dental and eye exams.  Immunizations.  Screening for certain conditions.  Healthy lifestyle choices, such as: ? Eating a healthy diet. ? Getting regular exercise. ? Not using drugs or products that contain nicotine and tobacco. ? Limiting alcohol use. What can I expect for my preventive care visit? Physical exam Your health care provider will check your:  Height and weight. These may be used to calculate your BMI (body mass index). BMI is a measurement that tells if you are at a healthy weight.  Heart rate and blood pressure.  Body temperature.  Skin for abnormal spots. Counseling Your health care provider may ask you questions about your:  Past medical problems.  Family's medical history.  Alcohol, tobacco, and drug use.  Emotional well-being.  Home life and relationship well-being.  Sexual activity.  Diet, exercise, and sleep habits.  Work and work Statistician.  Access to firearms.  Method of birth control.  Menstrual cycle.  Pregnancy history. What immunizations do I need? Vaccines are usually given at various ages, according to a schedule. Your health care provider will recommend vaccines for you based on your age, medical history, and lifestyle or other factors, such as travel or where you work.   What tests do I need? Blood tests  Lipid and cholesterol levels. These may be checked every 5 years, or more often if you are over 3 years old.  Hepatitis C test.  Hepatitis B test. Screening  Lung cancer screening. You may have this screening every year starting at age 73 if you have a 30-pack-year history of smoking and currently smoke or have quit within the past 15 years.  Colorectal cancer  screening. ? All adults should have this screening starting at age 52 and continuing until age 17. ? Your health care provider may recommend screening at age 49 if you are at increased risk. ? You will have tests every 1-10 years, depending on your results and the type of screening test.  Diabetes screening. ? This is done by checking your blood sugar (glucose) after you have not eaten for a while (fasting). ? You may have this done every 1-3 years.  Mammogram. ? This may be done every 1-2 years. ? Talk with your health care provider about when you should start having regular mammograms. This may depend on whether you have a family history of breast cancer.  BRCA-related cancer screening. This may be done if you have a family history of breast, ovarian, tubal, or peritoneal cancers.  Pelvic exam and Pap test. ? This may be done every 3 years starting at age 10. ? Starting at age 11, this may be done every 5 years if you have a Pap test in combination with an HPV test. Other tests  STD (sexually transmitted disease) testing, if you are at risk.  Bone density scan. This is done to screen for osteoporosis. You may have this scan if you are at high risk for osteoporosis. Talk with your health care provider about your test results, treatment options, and if necessary, the need for more tests. Follow these instructions at home: Eating and drinking  Eat a diet that includes fresh fruits and vegetables, whole grains, lean protein, and low-fat dairy products.  Take vitamin and mineral supplements  as recommended by your health care provider.  Do not drink alcohol if: ? Your health care provider tells you not to drink. ? You are pregnant, may be pregnant, or are planning to become pregnant.  If you drink alcohol: ? Limit how much you have to 0-1 drink a day. ? Be aware of how much alcohol is in your drink. In the [place] one drink equals one 12 oz bottle of beer (355 mL), one 5 oz glass of  wine (148 mL), or one 1 oz glass of hard liquor (44 mL).   Lifestyle  Take daily care of your teeth and gums. Brush your teeth every morning and night with fluoride toothpaste. Floss one time each day.  Stay active. Exercise for at least 30 minutes 5 or more days each week.  Do not use any products that contain nicotine or tobacco, such as cigarettes, e-cigarettes, and chewing tobacco. If you need help quitting, ask your health care provider.  Do not use drugs.  If you are sexually active, practice safe sex. Use a condom or other form of protection to prevent STIs (sexually transmitted infections).  If you do not wish to become pregnant, use a form of birth control. If you plan to become pregnant, see your health care provider for a prepregnancy visit.  If told by your health care provider, take low-dose aspirin daily starting at age 50.  Find healthy ways to cope with stress, such as: ? Meditation, yoga, or listening to music. ? Journaling. ? Talking to a trusted person. ? Spending time with friends and family. Safety  Always wear your seat belt while driving or riding in a vehicle.  Do not drive: ? If you have been drinking alcohol. Do not ride with someone who has been drinking. ? When you are tired or distracted. ? While texting.  Wear a helmet and other protective equipment during sports activities.  If you have firearms in your house, make sure you follow all gun safety procedures. What's next?  Visit your health care provider once a year for an annual wellness visit.  Ask your health care provider how often you should have your eyes and teeth checked.  Stay up to date on all vaccines. This information is not intended to replace advice given to you by your health care provider. Make sure you discuss any questions you have with your health care provider. Document Revised: 08/13/2020 Document Reviewed: 07/21/2018 Elsevier Patient Education  2021 Elsevier Inc.  

## 2021-03-14 NOTE — Progress Notes (Signed)
HPI: Hailey Robinson is a 60 y.o. 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  she presents to [facility] today, 03/14/21,  for chief complaint of: Annual physical exam  Dentist: had teeth pulled in January, has plate Eye exam: February 2022, new glasses  Exercise: a little but back pain is a limiter Diet: no special diet but eating soft foods since she doesn't have teeth Pap smear: UTD Mammogram: UTD Colon cancer screening: UTD COVID vaccine: UTD  Concerns: Although she is taking doxepin 25 mg at bedtime for sleep/anxiety, she is still having significant difficulty with sleep onset.  Notes that her usual routine is to eat dinner and then go to bed but most nights is not able to get to sleep until the early a.m. hours.  Has to get up at 5 AM to be able to make it to work on time.  Has tried melatonin and other over-the-counter medications but these are not helpful.  Would like to continue doxepin as it helps with anxiety.  Past medical, surgical, social and family history reviewed:  Patient Active Problem  List   Diagnosis Date Noted  . Strain of muscle, fascia and tendon of lower back, initial encounter 03/11/2021  . Superficial perivascular dermatitis 04/21/2019  . Prediabetes 03/29/2019  . Hypokalemia due to excessive renal loss of potassium 03/29/2019  . Degenerative arthritis of cervical spine 03/29/2019  . Chronic bronchitis ([place]) 03/29/2019  . Cigarette nicotine dependence without complication 63/78/5885  . At risk for cardiovascular event 05/10/2018  . Dyslipidemia, goal LDL below 70 05/06/2018  . H/O Graves' disease 05/05/2018  . Unilateral vocal cord paralysis 12/07/2017  . 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 Status post partial thyroidectomy 02/10/2017  . Essential hypertension 11/04/2016    Past Surgical History:  Procedure Laterality Date  . BIOPSY THYROID  12/2016 X 2  . LACERATION REPAIR Right 1985   pinky  . THYROID LOBECTOMY Left 02/10/2017  . THYROID LOBECTOMY Left 02/10/2017   Procedure: LEFT THYROID LOBECTOMY;  Surgeon: Ralene Ok, MD;  Location: [facility];  Service: General;  Laterality: Left;  . TUBAL LIGATION      Social History   Tobacco Use  . Smoking status: Current Every Day Smoker    Packs/day: 0.50    Years: 15.00    Pack years: 7.50    Types: Cigarettes  . Smokeless tobacco: Never Used  . Tobacco comment: As per pt - smokes 10 cigs a day  Substance Use Topics  . Alcohol use: Yes    Alcohol/week: 2.0 standard drinks    Types: 2 Cans of beer per week    Comment: "weekends only"    Family History  Problem Relation Age of Onset  . Hyperlipidemia Mother   . Hypertension Mother   . Hypertension Father   . Hyperlipidemia Father   . Thyroid disease Sister      Current medication list and allergy/intolerance information reviewed:    Current  Outpatient Medications  Medication Sig Dispense Refill  . albuterol (VENTOLIN HFA) 108 (90 Base) MCG/ACT inhaler Inhale 1-2 puffs into the lungs every 4 (four) hours as needed for wheezing or shortness of breath.  6.7 g 1  . amLODipine (NORVASC) 5 MG tablet TAKE 1 TABLET(5 MG) BY MOUTH DAILY 30 tablet 5  . aspirin EC 81 MG tablet Take 1 tablet (81 mg total) by mouth daily. 90 tablet 3  . atorvastatin (LIPITOR) 10 MG tablet TAKE 1 TABLET(10 MG) BY MOUTH DAILY 30 tablet 5  . benzonatate (TESSALON) 200 MG capsule Take 1 capsule (200 mg total) by mouth 3 (three) times daily as needed for cough. 45 capsule 0  . doxepin (SINEQUAN) 25 MG capsule Take 1 capsule (25 mg total) by mouth at bedtime as needed (sleep/anxiety). 90 capsule 1  . fexofenadine (ALLEGRA) 180 MG tablet Take 180 mg by mouth daily.    . fluticasone (FLONASE) 50 MCG/ACT nasal spray Place 1 spray into both nostrils daily. 16 g 0  . hydrochlorothiazide (HYDRODIURIL) 25 MG tablet TAKE 1 TABLET(25 MG) BY MOUTH DAILY 30 tablet 5  . hydrOXYzine (ATARAX/VISTARIL) 50 MG tablet Take 0.5-1 tablets (25-50 mg total) by mouth at bedtime. 30 tablet 3  . methocarbamol (ROBAXIN) 500 MG tablet Take 1 tablet (500 mg total) by mouth 3 (three) times daily. 30 tablet 3  . potassium chloride SA (KLOR-CON) 20 MEQ tablet Take 1 tablet (20 mEq total) by mouth 2 (two) times daily. 180 tablet 3  . Pseudoephedrine-Guaifenesin (MUCINEX D MAX STRENGTH) [phone] MG TB12 Take 1 tablet by mouth in the morning and at bedtime. 20 tablet 0   No current facility-administered medications for this visit.    Allergies  Allergen Reactions  . Codeine Hives and Itching      Review of Systems:  Constitutional:  No  fever, no chills, No recent illness, No unintentional weight changes. No significant fatigue.   HEENT: No  headache, no vision change, no hearing change, No sore throat, + sinus pressure  Cardiac: No  chest pain, No  pressure, No palpitations, No  Orthopnea  Respiratory:  No  shortness of breath. + Intermittent cough  Gastrointestinal: No  abdominal pain, No  nausea, No  vomiting,  No  blood in stool, No  diarrhea, No  constipation   Musculoskeletal: No new  myalgia/arthralgia, continued back pain from recent injury  Skin: No  Rash, No other wounds/concerning lesions  Genitourinary: No  incontinence, No  abnormal genital bleeding, No abnormal genital discharge  Hem/Onc: No  easy bruising/bleeding, No  abnormal lymph node  Endocrine: No cold intolerance,  No heat intolerance. No polyuria/polydipsia/polyphagia   Neurologic: No  weakness, No  dizziness, No  slurred speech/focal weakness/facial droop  Psychiatric: No  concerns with depression, + concerns with anxiety, + sleep problems, No mood problems  Exam:  BP 117/77   Pulse 94   Temp 98.6 F (37 C)   Ht 5' 4.75" (1.645 m)   Wt 197 lb 6.4 oz (89.5 kg)   LMP 01/24/2018 (Approximate)   SpO2 94%   BMI 33.10 kg/m   Constitutional: VS see above. General Appearance: alert, well-developed, well-nourished, NAD  Eyes: Normal lids and conjunctive, non-icteric sclera  Ears, Nose, Mouth, Throat: MMM, Normal external inspection ears/nares/mouth/lips/gums. TM normal bilaterally.   Neck: No masses, trachea midline. No thyroid enlargement. No tenderness/mass appreciated. No lymphadenopathy  Respiratory: Normal respiratory effort. no wheeze, no rhonchi, no rales  Cardiovascular: S1/S2 normal, no murmur, no rub/gallop auscultated.  RRR. No lower extremity edema. Pedal pulse II/IV bilaterally PT. No carotid bruit or JVD. No abdominal aortic bruit.  Gastrointestinal: Nontender, no masses. No hepatomegaly, no splenomegaly. No hernia appreciated. Bowel sounds normal. Rectal exam deferred.   Musculoskeletal: Gait normal. No clubbing/cyanosis of digits.   Neurological: Normal balance/coordination. No tremor. No cranial nerve deficit on limited exam. Motor and sensation intact and symmetric. Cerebellar reflexes intact.   Skin: warm, dry, intact. No rash/ulcer. No concerning nevi or subq nodules on limited exam.    Psychiatric: Normal judgment/insight. Normal mood and affect. Oriented x3.    No  results found for this or any previous visit (from the past 72 hour(s)).  No results found.   ASSESSMENT/PLAN:   1. Annual physical exam Checking annual physical labs today.  She is up-to-date on other preventative care.  2. Essential hypertension Checking CBC with differential and CMP.  Continue amlodipine 5 mg daily. - CBC with Differential/Platelet - COMPLETE METABOLIC PANEL WITH GFR  3. Dyslipidemia, goal LDL below 70 Checking lipid panel.  Continue atorvastatin 10 mg daily. - CBC with Differential/Platelet - COMPLETE METABOLIC PANEL WITH GFR - Lipid panel  4. Status post partial thyroidectomy 5. H/O Graves' disease Checking TSH. - TSH  6. Primary insomnia Adding hydroxyzine 25-50 mg nightly as needed for sleep.  7. Encounter for screening mammogram for malignant neoplasm of breast History of breast concerns.  We will order a screening breast mammogram as I could not find documentation of what her neck step should be.  If she needs a diagnostic mammogram, we will send that to the breast center in [place]. - MM 3D SCREEN BREAST BILATERAL; Future  8. Seasonal allergies Recommend switching from Allegra to another over-the-counter antihistamine such as Xyzal, Zyrtec, or Claritin.  Continue Flonase.  Sending in [place] to help with cough related to postnasal drip.  9.  Back pain Unsurprising that she is still having back pain at this point considering the nature of her work.  Sending in methocarbamol to see if this is helpful since Flexeril caused too much sedation.  Orders Placed This Encounter  Procedures  . MM 3D SCREEN BREAST BILATERAL  . CBC with Differential/Platelet  . COMPLETE METABOLIC PANEL WITH GFR  . Lipid panel  . TSH    Meds ordered this encounter  Medications  . benzonatate (TESSALON) 200 MG capsule    Sig: Take 1 capsule (200 mg total) by mouth 3 (three) times daily as needed for cough.    Dispense:  45 capsule    Refill:  0    Order  Specific Question:   Supervising Provider    Answer:   Emeterio Reeve [id number]  . methocarbamol (ROBAXIN) 500 MG tablet    Sig: Take 1 tablet (500 mg total) by mouth 3 (three) times daily.    Dispense:  30 tablet    Refill:  3    Order Specific Question:   Supervising Provider    Answer:   Emeterio Reeve [id number]  . hydrOXYzine (ATARAX/VISTARIL) 50 MG tablet    Sig: Take 0.5-1 tablets (25-50 mg total) by mouth at bedtime.    Dispense:  30 tablet    Refill:  3    Order Specific Question:   Supervising Provider    Answer:   Emeterio Reeve [[id number]]    Patient Instructions   Preventive Care 90-73 Years Old, Female Preventive care refers to lifestyle choices and visits with your health care provider that can promote health and wellness. This includes:  A yearly physical exam. This is also called an annual wellness visit.  Regular dental and eye exams.  Immunizations.  Screening for certain conditions.  Healthy lifestyle choices, such as: ? Eating a healthy diet. ? Getting regular exercise. ? Not using drugs or products that contain nicotine and tobacco. ? Limiting alcohol use. What can I expect for my preventive care visit? Physical exam Your health care provider will check your:  Height and weight. These may be used to calculate your BMI (body mass index). BMI is a measurement that tells if you are at a healthy weight.  Heart rate and blood pressure.  Body temperature.  Skin for abnormal spots. Counseling Your health care provider may ask you questions about your:  Past medical problems.  Family's medical history.  Alcohol, tobacco, and drug use.  Emotional well-being.  Home life and relationship well-being.  Sexual activity.  Diet, exercise, and sleep habits.  Work and work Statistician.  Access to firearms.  Method of birth control.  Menstrual cycle.  Pregnancy history. What immunizations do I need? Vaccines are usually given at  various ages, according to a schedule. Your health care provider will recommend vaccines for you based on your age, medical history, and lifestyle or other factors, such as travel or where you work.   What tests do I need? Blood tests  Lipid and cholesterol levels. These may be checked every 5 years, or more often if you are over 15 years old.  Hepatitis C test.  Hepatitis B test. Screening  Lung cancer screening. You may have this screening every year starting at age 68 if you have a 30-pack-year history of smoking and currently smoke or have quit within the past 15 years.  Colorectal cancer screening. ? All adults should have this screening starting at age 45 and continuing until age 54. ? Your health care provider may recommend screening at age 42 if you are at increased risk. ? You will have tests every 1-10 years, depending on your results and the type of screening test.  Diabetes screening. ? This is done by checking your blood sugar (glucose) after you have not eaten for a while (fasting). ? You may have this done every 1-3 years.  Mammogram. ? This may be done every 1-2 years. ? Talk with your health care provider about when you should start having regular mammograms. This may depend on whether you have a family history of breast cancer.  BRCA-related cancer screening. This may be done if you have a family history of breast, ovarian, tubal, or peritoneal cancers.  Pelvic exam and Pap test. ? This may be done every 3 years starting at age 93. ? Starting at age 49, this may be done every 5 years if you have a Pap test in combination with an HPV test. Other tests  STD (sexually transmitted disease) testing, if you are at risk.  Bone density scan. This is done to screen for osteoporosis. You may have this scan if you are at high risk for osteoporosis. Talk with your health care provider about your test results, treatment options, and if necessary, the need for more  tests. Follow these instructions at home: Eating and drinking  Eat a diet that includes fresh fruits and vegetables, whole grains, lean protein, and low-fat dairy products.  Take vitamin and mineral supplements as recommended by your health care provider.  Do not drink alcohol if: ? Your health care provider tells you not to drink. ? You are pregnant,  may be pregnant, or are planning to become pregnant.  If you drink alcohol: ? Limit how much you have to 0-1 drink a day. ? Be aware of how much alcohol is in your drink. In the [place] one drink equals one 12 oz bottle of beer (355 mL), one 5 oz glass of wine (148 mL), or one 1 oz glass of hard liquor (44 mL).   Lifestyle  Take daily care of your teeth and gums. Brush your teeth every morning and night with fluoride toothpaste. Floss one time each day.  Stay active. Exercise for at least 30 minutes 5 or more days each week.  Do not use any products that contain nicotine or tobacco, such as cigarettes, e-cigarettes, and chewing tobacco. If you need help quitting, ask your health care provider.  Do not use drugs.  If you are sexually active, practice safe sex. Use a condom or other form of protection to prevent STIs (sexually transmitted infections).  If you do not wish to become pregnant, use a form of birth control. If you plan to become pregnant, see your health care provider for a prepregnancy visit.  If told by your health care provider, take low-dose aspirin daily starting at age 57.  Find healthy ways to cope with stress, such as: ? Meditation, yoga, or listening to music. ? Journaling. ? Talking to a trusted person. ? Spending time with friends and family. Safety  Always wear your seat belt while driving or riding in a vehicle.  Do not drive: ? If you have been drinking alcohol. Do not ride with someone who has been drinking. ? When you are tired or distracted. ? While texting.  Wear a helmet and other protective  equipment during sports activities.  If you have firearms in your house, make sure you follow all gun safety procedures. What's next?  Visit your health care provider once a year for an annual wellness visit.  Ask your health care provider how often you should have your eyes and teeth checked.  Stay up to date on all vaccines. This information is not intended to replace advice given to you by your health care provider. Make sure you discuss any questions you have with your health care provider. Document Revised: 08/13/2020 Document Reviewed: 07/21/2018 Elsevier Patient Education  2021 [place].    Follow-up plan: Return in about 4 weeks (around 04/11/2021) for insomnia follow up.  Clearnce Sorrel, DNP, APRN, FNP-BC [facility]

## 2021-03-15 LAB — COMPLETE METABOLIC PANEL WITH GFR
AG Ratio: 1.5 (calc) (ref 1.0–2.5)
ALT: 13 U/L (ref 6–29)
AST: 17 U/L (ref 10–35)
Albumin: 4.5 g/dL (ref 3.6–5.1)
Alkaline phosphatase (APISO): 87 U/L (ref 37–153)
BUN: 9 mg/dL (ref 7–25)
CO2: 30 mmol/L (ref 20–32)
Calcium: 9.5 mg/dL (ref 8.6–10.4)
Chloride: 101 mmol/L (ref 98–110)
Creat: 0.59 mg/dL (ref 0.50–1.05)
GFR, Est African American: 116 mL/min/{1.73_m2} (ref >=60)
GFR, Est Non African American: 100 mL/min/{1.73_m2} (ref >=60)
Globulin: 3 g/dL (calc) (ref 1.9–3.7)
Glucose, Bld: 105 mg/dL — ABNORMAL HIGH (ref 65–99)
Potassium: 3.4 mmol/L — ABNORMAL LOW (ref 3.5–5.3)
Sodium: 141 mmol/L (ref 135–146)
Total Bilirubin: 0.4 mg/dL (ref 0.2–1.2)
Total Protein: 7.5 g/dL (ref 6.1–8.1)

## 2021-03-15 LAB — CBC WITH DIFFERENTIAL/PLATELET
Absolute Monocytes: 480 cells/uL (ref 200–950)
Basophils Absolute: 10 cells/uL (ref 0–200)
Basophils Relative: 0.2 %
Eosinophils Absolute: 108 cells/uL (ref 15–500)
Eosinophils Relative: 2.2 %
HCT: 42.8 % (ref 35.0–45.0)
Hemoglobin: 13.8 g/dL (ref 11.7–15.5)
Lymphs Abs: 1328 cells/uL (ref 850–3900)
MCH: 27.8 pg (ref 27.0–33.0)
MCHC: 32.2 g/dL (ref 32.0–36.0)
MCV: 86.1 fL (ref 80.0–100.0)
MPV: 10.7 fL (ref 7.5–12.5)
Monocytes Relative: 9.8 %
Neutro Abs: 2974 cells/uL (ref 1500–7800)
Neutrophils Relative %: 60.7 %
Platelets: 252 10*3/uL (ref 140–400)
RBC: 4.97 10*6/uL (ref 3.80–5.10)
RDW: 14.2 % (ref 11.0–15.0)
Total Lymphocyte: 27.1 %
WBC: 4.9 10*3/uL (ref 3.8–10.8)

## 2021-03-15 LAB — LIPID PANEL
Cholesterol: 181 mg/dL (ref <200)
HDL: 43 mg/dL — ABNORMAL LOW (ref >=50)
LDL Cholesterol (Calc): 118 mg/dL (calc) — ABNORMAL HIGH
Non-HDL Cholesterol (Calc): 138 mg/dL (calc) — ABNORMAL HIGH (ref <130)
Total CHOL/HDL Ratio: 4.2 (calc) (ref <5.0)
Triglycerides: 96 mg/dL (ref <150)

## 2021-03-15 LAB — TSH: TSH: 2.07 mIU/L (ref 0.40–4.50)

## 2021-03-15 MED ORDER — ATORVASTATIN CALCIUM 20 MG PO TABS: 20.0000 mg | ORAL_TABLET | Freq: Every day | ORAL | 3 refills | Status: DC

## 2021-03-15 NOTE — Addendum Note (Signed)
Addended bySamuel Bouche on: 03/15/2021 04:42 PM   Modules accepted: Orders

## 2021-04-11 ENCOUNTER — Ambulatory Visit: Payer: Federal, State, Local not specified - PPO | Admitting: Medical-Surgical

## 2021-04-11 DIAGNOSIS — F5101 Primary insomnia: Secondary | ICD-10-CM

## 2021-05-12 ENCOUNTER — Other Ambulatory Visit: Payer: Self-pay | Admitting: Nurse Practitioner

## 2021-05-12 DIAGNOSIS — G479 Sleep disorder, unspecified: Secondary | ICD-10-CM

## 2021-05-13 ENCOUNTER — Telehealth: Payer: Self-pay

## 2021-05-13 NOTE — Telephone Encounter (Signed)
Pt called stating the hydroxyzine made her have "crazy dreams" and would like to have it changed to something else. Looking in her chart, a Rx for doxepin 25 mg was sent to her pharmacy by [place] Pho, FNP. I told her to discontinue the hydroxyzine and start the doxepin and let us know if she has any SE/AR to the new med.   I told her that according to her chart she has been having issues managed by both Samuel Bouche, FNP and Emeterio Reeve Early, FNP. I asked her if she wanted to follow Emeterio Reeve to her new practice at [facility] in [place] or continue to see Caryl Asp here in [place]. She stated that she wanted to continue to be seen here in [place]. I told her that I would make a notation in her chart that she will not be following Emeterio Reeve so that there is no more confusion with things being managed by 2 different providers in 2 different locations.   No further questions or concerns at this time.

## 2021-06-15 ENCOUNTER — Other Ambulatory Visit: Payer: Self-pay | Admitting: Nurse Practitioner

## 2021-06-15 DIAGNOSIS — I10 Essential (primary) hypertension: Secondary | ICD-10-CM

## 2021-06-15 DIAGNOSIS — Z9189 Other specified personal risk factors, not elsewhere classified: Secondary | ICD-10-CM

## 2021-07-24 ENCOUNTER — Other Ambulatory Visit: Payer: Self-pay | Admitting: Nurse Practitioner

## 2021-07-24 DIAGNOSIS — E876 Hypokalemia: Secondary | ICD-10-CM

## 2021-08-05 ENCOUNTER — Other Ambulatory Visit: Payer: Self-pay | Admitting: Nurse Practitioner

## 2021-08-05 DIAGNOSIS — I10 Essential (primary) hypertension: Secondary | ICD-10-CM

## 2021-08-05 DIAGNOSIS — Z9189 Other specified personal risk factors, not elsewhere classified: Secondary | ICD-10-CM

## 2021-08-05 DIAGNOSIS — G479 Sleep disorder, unspecified: Secondary | ICD-10-CM

## 2021-08-11 ENCOUNTER — Other Ambulatory Visit: Payer: Self-pay | Admitting: Nurse Practitioner

## 2021-08-11 ENCOUNTER — Other Ambulatory Visit: Payer: Self-pay

## 2021-08-11 DIAGNOSIS — I10 Essential (primary) hypertension: Secondary | ICD-10-CM

## 2021-08-11 DIAGNOSIS — Z9189 Other specified personal risk factors, not elsewhere classified: Secondary | ICD-10-CM

## 2021-08-11 DIAGNOSIS — G479 Sleep disorder, unspecified: Secondary | ICD-10-CM

## 2021-08-11 MED ORDER — HYDROCHLOROTHIAZIDE 25 MG PO TABS: ORAL_TABLET | ORAL | 0 refills | Status: DC

## 2021-08-26 ENCOUNTER — Ambulatory Visit (INDEPENDENT_AMBULATORY_CARE_PROVIDER_SITE_OTHER): Payer: Federal, State, Local not specified - PPO

## 2021-08-26 ENCOUNTER — Ambulatory Visit (INDEPENDENT_AMBULATORY_CARE_PROVIDER_SITE_OTHER): Payer: Federal, State, Local not specified - PPO | Admitting: Family Medicine

## 2021-08-26 ENCOUNTER — Encounter: Payer: Self-pay | Admitting: Family Medicine

## 2021-08-26 ENCOUNTER — Other Ambulatory Visit: Payer: Self-pay

## 2021-08-26 VITALS — BP 143/84 | HR 88 | Temp 97.7°F | Wt 203.1 lb

## 2021-08-26 DIAGNOSIS — M545 Low back pain, unspecified: Secondary | ICD-10-CM | POA: Diagnosis not present

## 2021-08-26 DIAGNOSIS — M5442 Lumbago with sciatica, left side: Secondary | ICD-10-CM

## 2021-08-26 DIAGNOSIS — M79672 Pain in left foot: Secondary | ICD-10-CM

## 2021-08-26 DIAGNOSIS — G8929 Other chronic pain: Secondary | ICD-10-CM

## 2021-08-26 MED ORDER — PREDNISONE 20 MG PO TABS: 40.0000 mg | ORAL_TABLET | Freq: Every day | ORAL | 0 refills | Status: AC

## 2021-08-26 MED ORDER — MELOXICAM 15 MG PO TABS: 15.0000 mg | ORAL_TABLET | Freq: Every day | ORAL | 0 refills | Status: DC

## 2021-08-26 MED ORDER — METHOCARBAMOL 500 MG PO TABS: 500.0000 mg | ORAL_TABLET | Freq: Three times a day (TID) | ORAL | 3 refills | Status: DC

## 2021-08-26 NOTE — Progress Notes (Signed)
Acute Office Visit  Subjective:    Patient ID: Hailey Robinson, female    DOB: September 19, 1961, 60 y.o.   MRN: [id number]  Chief Complaint  Patient presents with   Back Pain   Foot Pain    Outer Left area x 1 mth    HPI Patient is in today for back and foot pain  LEFT FOOT PAIN: Patient reports she has had over a month of lateral left foot pain that continues to worsen. States it can be as bad as 9/10 throbbing, worse with walking. She has tried new shoes, inserts, copper sleeve/brace. She denies any known injuries, bruising, swelling.   BACK PAIN: Last March, patient injured her back at work, but got improvement with steroid burst and never came back for xrays. States over the past 1-2 months, pain has worsened. States it can be up to 8/10 throbbing of left lower back with occasional shooting pain down back of leg stopping at the knee. She reports symptoms are worse with bending and prolonged walking. Feels somewhat better with lying down, and extension of spine. She denies any numbness, tingling, incontinence, rashes. States her muscle relaxer has been helping along with massage and heating bad. Tylenol has not been effective.    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   Fatigue 03/28/2019   Functional systolic murmur    normal ECHO 07/2016   GERD (gastroesophageal reflux disease)    spicy foods   Grade I internal hemorrhoids 04/13/2017   Repeat colonoscopy in 10 years   Hypertension    Iron deficiency 03/29/2019   Left thyroid nodule 11/04/2016   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   Leukopenia 05/26/2018   New daily  persistent headache 05/05/2018   Perimenopause 11/04/2016   Pilonidal sinus 11/04/2016   Pneumonia 10/2016   Prediabetes 03/29/2019   Seasonal allergies    Subacute sphenoidal sinusitis 07/05/2019   Unilateral vocal cord paralysis    Weakness of left upper extremity 03/28/2019    Past Surgical History:  Procedure Laterality Date   BIOPSY THYROID  12/2016 X 2   LACERATION REPAIR Right 1985   pinky   THYROID LOBECTOMY Left 02/10/2017   THYROID LOBECTOMY Left 02/10/2017   Procedure: LEFT THYROID LOBECTOMY;  Surgeon: Ralene Ok, MD;  Location: [facility] OR;  Service: General;  Laterality: Left;   TUBAL LIGATION      Family History  Problem Relation Age of Onset   Hyperlipidemia Mother    Hypertension Mother    Hypertension Father    Hyperlipidemia Father    Thyroid disease Sister     Social History   Socioeconomic History   Marital status: Married    Spouse name: Not on file   Number of children: Not on file   Years of education: Not on file   Highest education level: Not on file  Occupational History   Not on file  Tobacco Use   Smoking status: Every Day    Packs/day: 0.50    Years: 15.00    Pack years: 7.50    Types: Cigarettes   Smokeless tobacco: Never   Tobacco comments:  As per pt - smokes 10 cigs a day  Substance and Sexual Activity   Alcohol use: Yes    Alcohol/week: 2.0 standard drinks    Types: 2 Cans of beer per week    Comment: "weekends only"   Drug use: No   Sexual activity: Yes  Other Topics Concern   Not on file  Social History Narrative   Not on file   Social Determinants of Health   Financial Resource Strain: Not on file  Food Insecurity: Not on file  Transportation Needs: Not on file  Physical Activity: Not on file  Stress: Not on file  Social Connections: Not on file  Intimate Partner Violence: Not on file    Outpatient Medications Prior to Visit  Medication Sig Dispense Refill   albuterol (VENTOLIN HFA) 108 (90 Base) MCG/ACT inhaler  INHALE 1 TO 2 PUFFS INTO THE LUNGS EVERY 4 HOURS AS NEEDED FOR WHEEZING OR SHORTNESS OF BREATH 6.7 g 1   amLODipine (NORVASC) 5 MG tablet TAKE 1 TABLET(5 MG) BY MOUTH DAILY 30 tablet 5   aspirin EC 81 MG tablet Take 1 tablet (81 mg total) by mouth daily. 90 tablet 3   atorvastatin (LIPITOR) 20 MG tablet Take 1 tablet (20 mg total) by mouth daily. 90 tablet 3   benzonatate (TESSALON) 200 MG capsule Take 1 capsule (200 mg total) by mouth 3 (three) times daily as needed for cough. 45 capsule 0   doxepin (SINEQUAN) 25 MG capsule TAKE 1 CAPSULE(25 MG) BY MOUTH AT BEDTIME AS NEEDED FOR SLEEP OR ANXIETY 90 capsule 1   fexofenadine (ALLEGRA) 180 MG tablet Take 180 mg by mouth daily.     fluticasone (FLONASE) 50 MCG/ACT nasal spray Place 1 spray into both nostrils daily. 16 g 0   hydrochlorothiazide (HYDRODIURIL) 25 MG tablet TAKE 1 TABLET(25 MG) BY MOUTH DAILY 30 tablet 0   hydrOXYzine (ATARAX/VISTARIL) 50 MG tablet Take 0.5-1 tablets (25-50 mg total) by mouth at bedtime. 30 tablet 3   potassium chloride SA (KLOR-CON) 20 MEQ tablet TAKE 1 TABLET(20 MEQ) BY MOUTH TWICE DAILY 180 tablet 3   methocarbamol (ROBAXIN) 500 MG tablet Take 1 tablet (500 mg total) by mouth 3 (three) times daily. 30 tablet 3   Pseudoephedrine-Guaifenesin (MUCINEX D MAX STRENGTH) [phone] MG TB12 Take 1 tablet by mouth in the morning and at bedtime. (Patient not taking: Reported on 08/26/2021) 20 tablet 0   No facility-administered medications prior to visit.    Allergies  Allergen Reactions   Codeine Hives and Itching    Review of Systems All review of systems negative except what is listed in the HPI     Objective:    Physical Exam Vitals reviewed.  Constitutional:      Appearance: Normal appearance.  HENT:     Head: Normocephalic and atraumatic.  Musculoskeletal:        General: Tenderness present. No swelling.     Comments: Tenderness over left lower back near SI joint; tenderness to L lateral foot near peroneal  tendon  Skin:    Capillary Refill: Capillary refill takes less than 2 seconds.     Findings: No bruising, erythema or rash.  Neurological:     Mental Status: She is alert and oriented to person, place, and time.  Psychiatric:        Mood and Affect: Mood normal.        Behavior: Behavior normal.        Thought Content: Thought content normal.  Judgment: Judgment normal.    BP (!) 143/84 (BP Location: Left Arm, Patient Position: Sitting, Cuff Size: Large)   Pulse 88   Temp 97.7 F (36.5 C) (Oral)   Wt 203 lb 1.9 oz (92.1 kg)   LMP 01/24/2018 (Approximate)   BMI 34.06 kg/m  Wt Readings from Last 3 Encounters:  08/26/21 203 lb 1.9 oz (92.1 kg)  03/14/21 197 lb 6.4 oz (89.5 kg)  01/24/21 198 lb 11.2 oz (90.1 kg)    Health Maintenance Due  Topic Date Due   Zoster Vaccines- Shingrix (1 of 2) Never done   COVID-19 Vaccine (3 - Booster for Pfizer series) 07/11/2020   INFLUENZA VACCINE  06/23/2021    There are no preventive care reminders to display for this patient.   Lab Results  Component Value Date   TSH 2.07 03/14/2021   Lab Results  Component Value Date   WBC 4.9 03/14/2021   HGB 13.8 03/14/2021   HCT 42.8 03/14/2021   MCV 86.1 03/14/2021   PLT 252 03/14/2021   Lab Results  Component Value Date   NA 141 03/14/2021   K 3.4 (L) 03/14/2021   CO2 30 03/14/2021   GLUCOSE 105 (H) 03/14/2021   BUN 9 03/14/2021   CREATININE 0.59 03/14/2021   BILITOT 0.4 03/14/2021   ALKPHOS 77 11/04/2016   AST 17 03/14/2021   ALT 13 03/14/2021   PROT 7.5 03/14/2021   ALBUMIN 4.1 11/04/2016   CALCIUM 9.5 03/14/2021   ANIONGAP 12 02/10/2017   Lab Results  Component Value Date   CHOL 181 03/14/2021   Lab Results  Component Value Date   HDL 43 (L) 03/14/2021   Lab Results  Component Value Date   LDLCALC 118 (H) 03/14/2021   Lab Results  Component Value Date   TRIG 96 03/14/2021   Lab Results  Component Value Date   CHOLHDL 4.2 03/14/2021   Lab Results   Component Value Date   HGBA1C 6.0 (H) 02/12/2020       Assessment & Plan:    1. Chronic left-sided low back pain with left-sided sciatica Xrays today Prednisone burst followed by Meloxicam Refill of muscle relaxer for PRN use Handout with home exercises given - will let us know if she changes her mind regarding formal PT  - DG Lumbar Spine Complete; Future - predniSONE (DELTASONE) 20 MG tablet; Take 2 tablets (40 mg total) by mouth daily with breakfast for 5 days.  Dispense: 10 tablet; Refill: 0 - meloxicam (MOBIC) 15 MG tablet; Take 1 tablet (15 mg total) by mouth daily. Do not start until finished with prednisone.  Dispense: 30 tablet; Refill: 0 - methocarbamol (ROBAXIN) 500 MG tablet; Take 1 tablet (500 mg total) by mouth 3 (three) times daily.  Dispense: 30 tablet; Refill: 3  2. Left foot pain Xrays today - if negative, consider peroneal tendonitis Prednisone followed by meloxicam Discussed/demonstrated exercises/stretches for home - consider formal PT  - DG Foot Complete Left; Future - predniSONE (DELTASONE) 20 MG tablet; Take 2 tablets (40 mg total) by mouth daily with breakfast for 5 days.  Dispense: 10 tablet; Refill: 0 - meloxicam (MOBIC) 15 MG tablet; Take 1 tablet (15 mg total) by mouth daily. Do not start until finished with prednisone.  Dispense: 30 tablet; Refill: 0   Patient aware of signs/symptoms requiring further/urgent evaluation.  If no improvement in 4-6, follow-up with sports medicine.   Purcell Nails Olevia Bowens, DNP, FNP-C

## 2021-08-26 NOTE — Patient Instructions (Signed)
Prednisone burst After finishing prednisone, start meloxicam daily (antiinflammatory) Back and foot exercises/stretches at home. If you change your mind and would like formal physical therapy, please let me know. Follow-up with me or Dr. Darene Lamer in 4-6 weeks if not improved.

## 2021-09-04 ENCOUNTER — Other Ambulatory Visit: Payer: Self-pay | Admitting: Nurse Practitioner

## 2021-09-04 DIAGNOSIS — E785 Hyperlipidemia, unspecified: Secondary | ICD-10-CM

## 2021-09-04 DIAGNOSIS — Z9189 Other specified personal risk factors, not elsewhere classified: Secondary | ICD-10-CM

## 2021-09-15 ENCOUNTER — Other Ambulatory Visit: Payer: Self-pay | Admitting: Medical-Surgical

## 2021-09-15 DIAGNOSIS — I10 Essential (primary) hypertension: Secondary | ICD-10-CM

## 2021-09-15 DIAGNOSIS — Z9189 Other specified personal risk factors, not elsewhere classified: Secondary | ICD-10-CM

## 2021-10-13 ENCOUNTER — Ambulatory Visit: Payer: Federal, State, Local not specified - PPO | Admitting: Family Medicine

## 2021-10-15 ENCOUNTER — Other Ambulatory Visit: Payer: Self-pay | Admitting: Medical-Surgical

## 2021-10-15 DIAGNOSIS — Z9189 Other specified personal risk factors, not elsewhere classified: Secondary | ICD-10-CM

## 2021-10-15 DIAGNOSIS — I10 Essential (primary) hypertension: Secondary | ICD-10-CM

## 2021-10-18 IMAGING — MG DIGITAL SCREENING BILAT W/ TOMO W/ CAD
8 series · 8 of 24 positions shown · non-contrast
Comparison: None.

CLINICAL DATA: Screening.

EXAM:
DIGITAL SCREENING BILATERAL MAMMOGRAM WITH TOMO AND CAD

[R CC synth-2D]
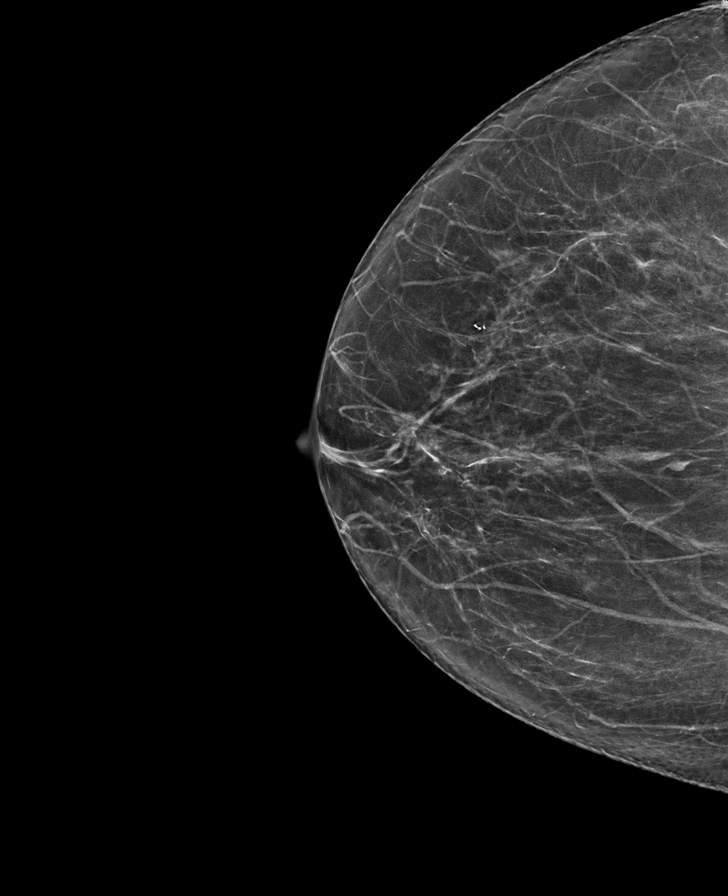

[R MLO synth-2D]
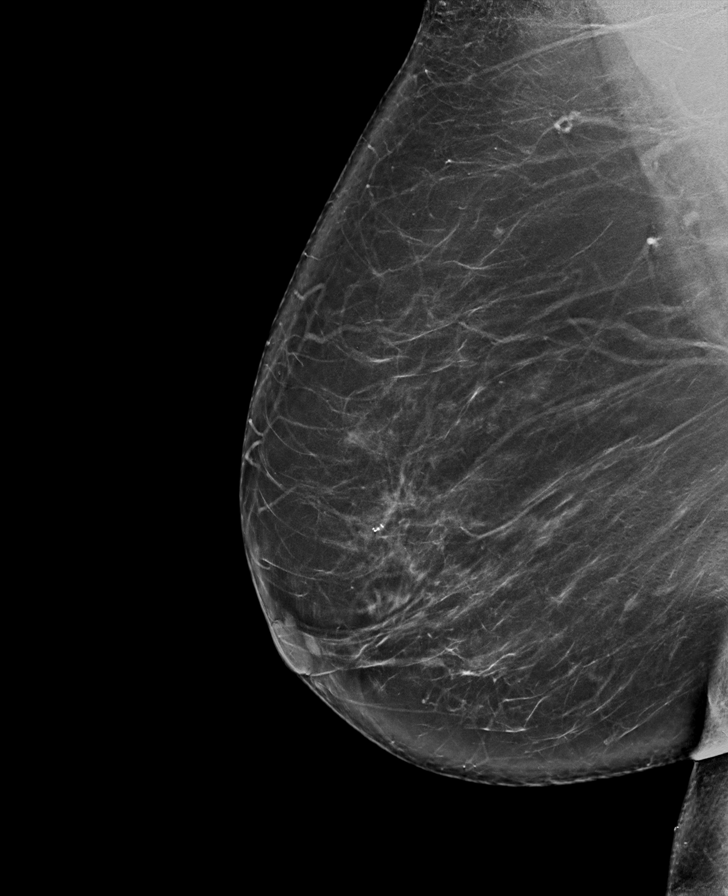

[L MLO synth-2D]
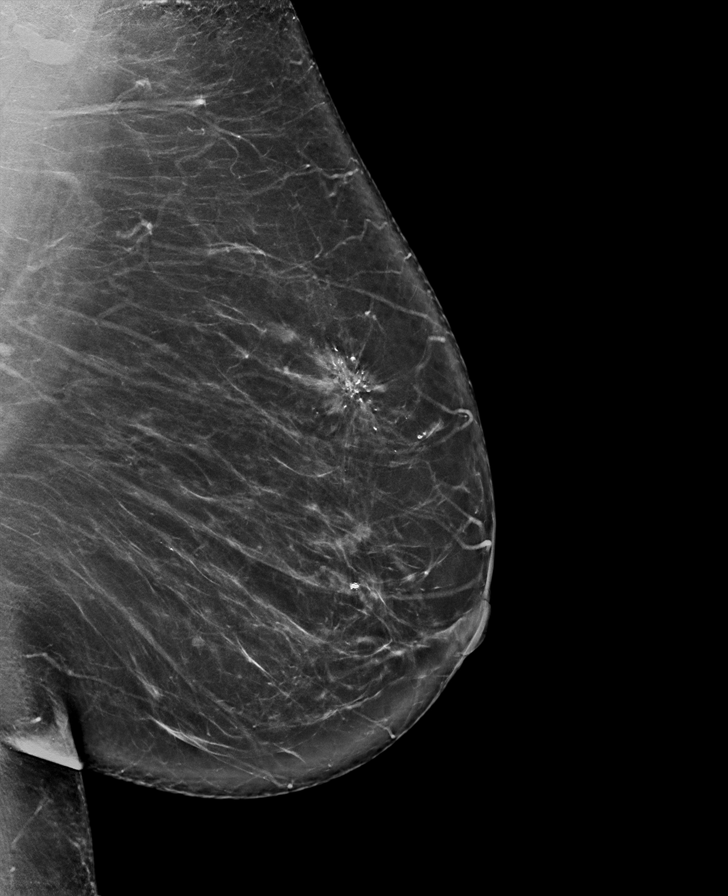

[L CC synth-2D]
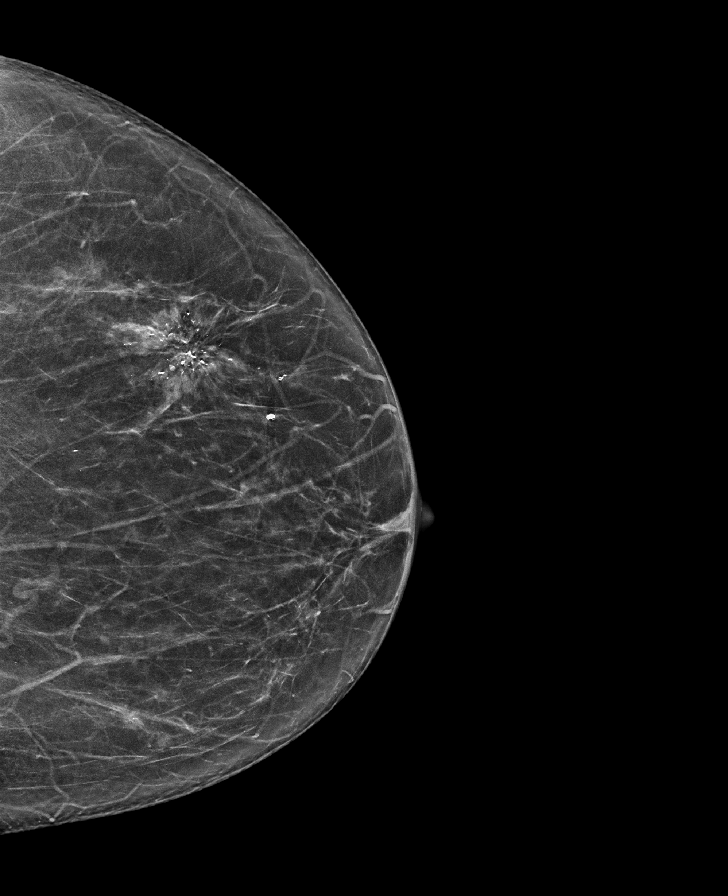

[L MLO tomo · tomo slice 47/92.0]
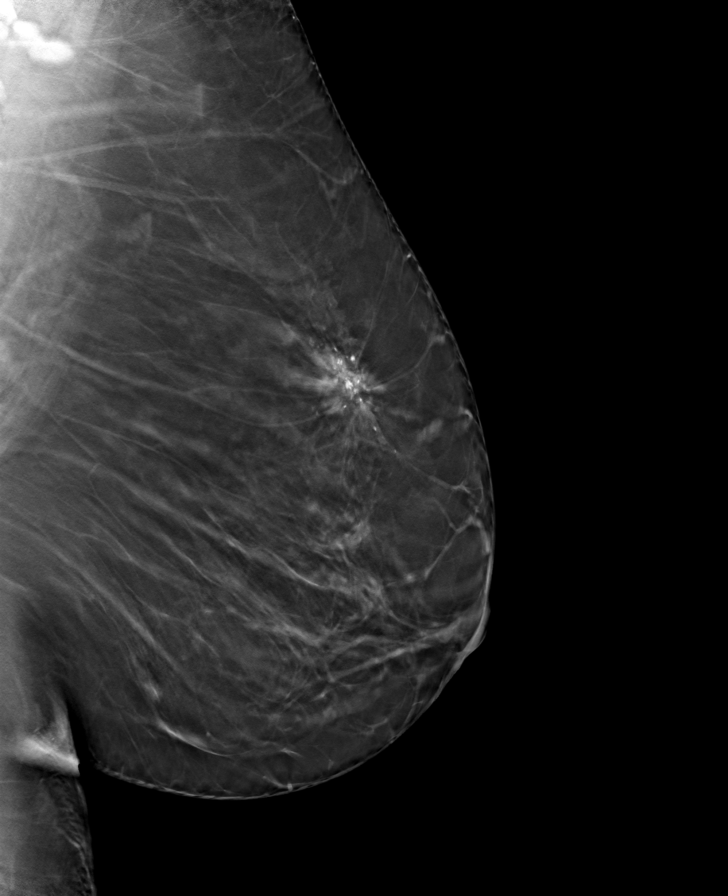

[R MLO tomo · tomo slice 47/94.0]
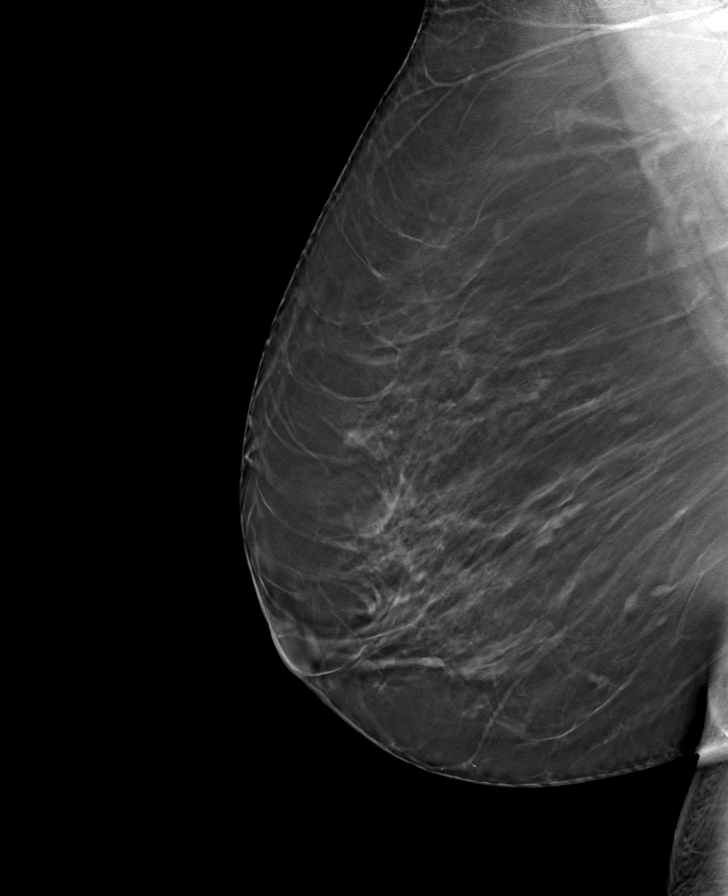

[L CC tomo · tomo slice 37/72.0]
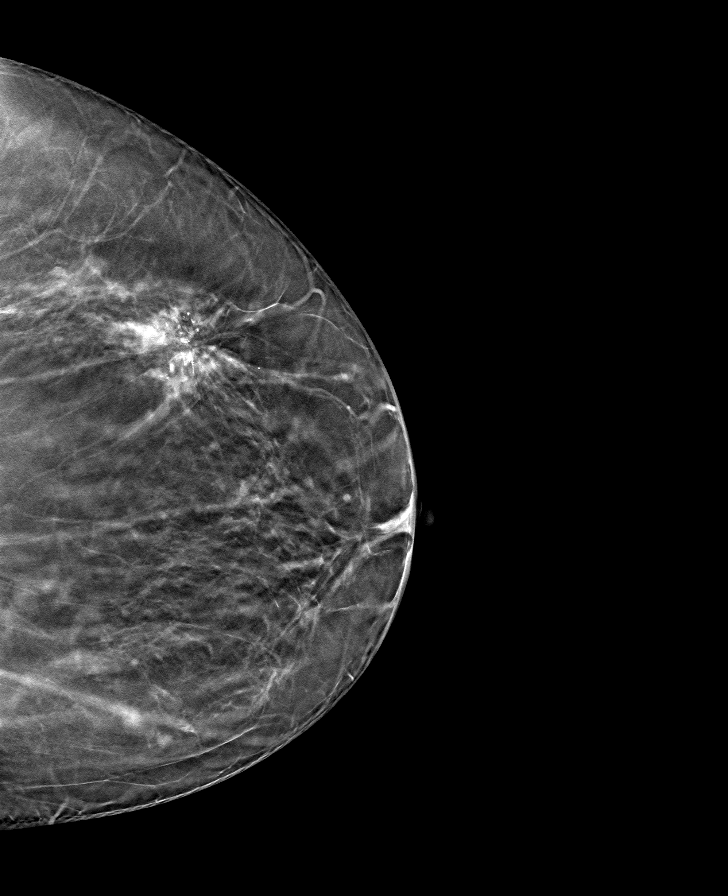

[R CC tomo · tomo slice 38/75.0]
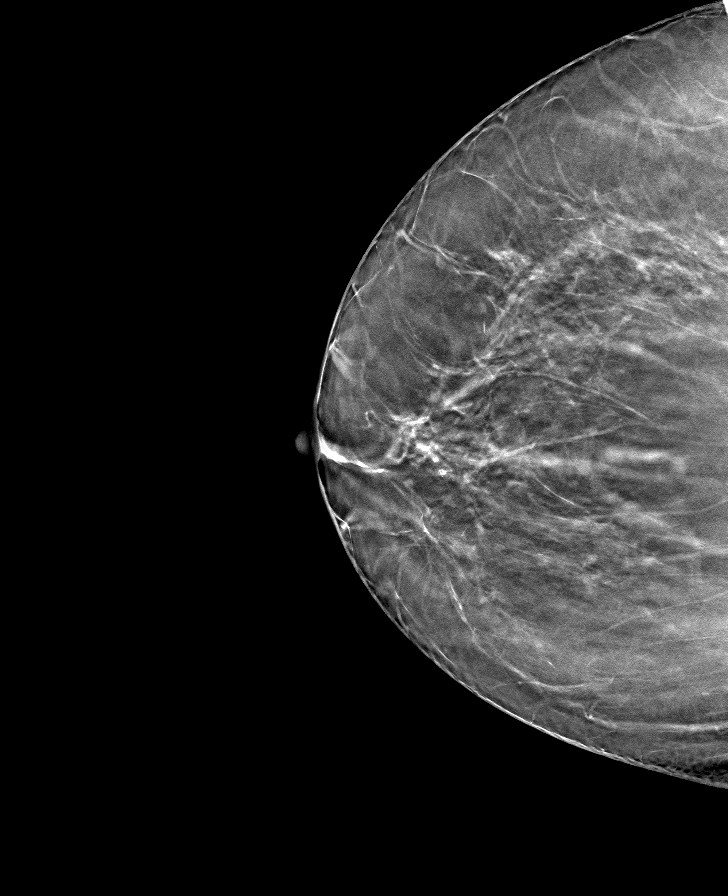

[8 of 24 positions shown; findings below may reference images not displayed]

ACR Breast Density Category b: There are scattered areas of
fibroglandular density.
FINDINGS: In the right breast , possible masses and calcifications requires
further evaluation.

In the left breast , distortion with associated calcifications, and
asymmetry, and additional scattered calcifications requires further
evaluation.

Images were processed with CAD.
IMPRESSION: Further evaluation is suggested for possible masses and
calcifications in the right breast.

Further evaluation is suggested for possible distortion, asymmetry
and calcifications in the left breast.

RECOMMENDATION:
Diagnostic mammogram and possibly ultrasound of both breasts.
(Code:[id number]-[id number])

The patient will be contacted regarding the findings, and additional
imaging will be scheduled.

BI-RADS CATEGORY  0: Incomplete. Need additional imaging evaluation
and/or prior mammograms for comparison.

## 2021-12-25 ENCOUNTER — Telehealth: Payer: Self-pay

## 2021-12-25 NOTE — Telephone Encounter (Signed)
Hailey Robinson called and states she tested positive for Covid. Her symptoms started on Saturday. She is having chills, sweats, body aches and shortness of breath. I advised to go to the urgent ASAP. No openings in our office today.

## 2022-01-10 ENCOUNTER — Other Ambulatory Visit: Payer: Self-pay | Admitting: Medical-Surgical

## 2022-01-10 DIAGNOSIS — Z9189 Other specified personal risk factors, not elsewhere classified: Secondary | ICD-10-CM

## 2022-01-10 DIAGNOSIS — I10 Essential (primary) hypertension: Secondary | ICD-10-CM

## 2022-01-20 ENCOUNTER — Other Ambulatory Visit: Payer: Self-pay | Admitting: Medical-Surgical

## 2022-01-20 DIAGNOSIS — I10 Essential (primary) hypertension: Secondary | ICD-10-CM

## 2022-01-20 DIAGNOSIS — Z9189 Other specified personal risk factors, not elsewhere classified: Secondary | ICD-10-CM

## 2022-02-02 ENCOUNTER — Telehealth: Payer: Self-pay

## 2022-02-02 NOTE — Telephone Encounter (Signed)
Patient called wanting for Joy to send in an antibiotic. She thinks that she has a sinus infection. Her head is clogged, back of neck is stiff, and very stuffy. ? ?I contacted the patient and LMVM for the patient to contact the office to make an appointment for her symptoms. ?

## 2022-02-05 ENCOUNTER — Ambulatory Visit: Payer: Federal, State, Local not specified - PPO | Admitting: Family Medicine

## 2022-02-05 ENCOUNTER — Encounter: Payer: Self-pay | Admitting: Family Medicine

## 2022-02-05 ENCOUNTER — Other Ambulatory Visit: Payer: Self-pay

## 2022-02-05 VITALS — BP 137/68 | HR 95 | Ht 66.0 in | Wt 200.0 lb

## 2022-02-05 DIAGNOSIS — J0100 Acute maxillary sinusitis, unspecified: Secondary | ICD-10-CM | POA: Diagnosis not present

## 2022-02-05 DIAGNOSIS — J302 Other seasonal allergic rhinitis: Secondary | ICD-10-CM | POA: Diagnosis not present

## 2022-02-05 MED ORDER — PREDNISONE 20 MG PO TABS: 40.0000 mg | ORAL_TABLET | Freq: Every day | ORAL | 0 refills | Status: DC

## 2022-02-05 MED ORDER — AZITHROMYCIN 250 MG PO TABS: ORAL_TABLET | ORAL | 0 refills | Status: AC

## 2022-02-05 NOTE — Progress Notes (Signed)
Established Patient Office Visit  Subjective:  Patient ID: Hailey Robinson, female    DOB: March 02, 1961  Age: 61 y.o. MRN: [id number]  CC:  Chief Complaint  Patient presents with   Sinusitis   Headache    HPI [name] presents for headache and sinus pressure for about a week and a half.  She does typically have spring and fall allergies.  She has been taking her Allegra and using her Vicks nasal spray.  She is also been doing her sinus rinses.  She actually had sinus surgery about 4 years ago and it has made a big difference in the frequency of her sinus infections.  This is the first when she has had this year.  Or it has been mildly sore and irritated and dry.  She has not had any cough or chest symptoms.  She does smoke.  She has a lot of pain in her maxillary sinuses with the right being worse than the left.  And a lot of pain at the base of her skull.  She said she just cannot shake this 1.  Past Medical History:  Diagnosis Date   Anemia    Anemia 02/03/2017   Arthritis    "think I have some in my lower back" (02/10/2017)   Chronic bronchitis ([facility]) 03/29/2019   Cognitive change 03/28/2019   Colitis 02/14/2019   Degenerative arthritis of cervical spine 03/29/2019   Elevated C-reactive protein (CRP) 03/29/2019   Encounter for smoking cessation counseling 05/26/2018   Facial paralysis/Bells palsy 07/05/2019   Fatigue 03/28/2019   Functional systolic murmur    normal ECHO 07/2016   GERD (gastroesophageal reflux disease)    spicy foods   Grade I internal hemorrhoids 04/13/2017   Repeat colonoscopy in 10 years   Hypertension    Iron deficiency 03/29/2019   Left thyroid nodule 11/04/2016   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   Leukopenia 05/26/2018   New daily persistent headache 05/05/2018   Perimenopause 11/04/2016   Pilonidal sinus 11/04/2016   Pneumonia 10/2016   Prediabetes 03/29/2019   Seasonal allergies    Subacute sphenoidal sinusitis 07/05/2019   Unilateral vocal cord paralysis     Weakness of left upper extremity 03/28/2019    Past Surgical History:  Procedure Laterality Date   BIOPSY THYROID  12/2016 X 2   LACERATION REPAIR Right 1985   pinky   THYROID LOBECTOMY Left 02/10/2017   THYROID LOBECTOMY Left 02/10/2017   Procedure: LEFT THYROID LOBECTOMY;  Surgeon: Axel Filler, MD;  Location: [facility] OR;  Service: General;  Laterality: Left;   TUBAL LIGATION      Family History  Problem Relation Age of Onset   Hyperlipidemia Mother    Hypertension Mother    Hypertension Father    Hyperlipidemia Father    Thyroid disease Sister     Social History   Socioeconomic History   Marital status: Married    Spouse name: Not on file   Number of children: Not on file   Years of education: Not on file   Highest education level: Not on file  Occupational History   Not on file  Tobacco Use   Smoking status: Every Day    Packs/day: 0.50    Years: 15.00    Pack years: 7.50    Types: Cigarettes   Smokeless tobacco: Never   Tobacco comments:    As per pt - smokes 10 cigs a day  Substance and Sexual Activity   Alcohol use: Yes    Alcohol/week: 2.0  standard drinks    Types: 2 Cans of beer per week    Comment: "weekends only"   Drug use: No   Sexual activity: Yes  Other Topics Concern   Not on file  Social History Narrative   Not on file   Social Determinants of Health   Financial Resource Strain: Not on file  Food Insecurity: Not on file  Transportation Needs: Not on file  Physical Activity: Not on file  Stress: Not on file  Social Connections: Not on file  Intimate Partner Violence: Not on file    Outpatient Medications Prior to Visit  Medication Sig Dispense Refill   albuterol (VENTOLIN HFA) 108 (90 Base) MCG/ACT inhaler INHALE 1 TO 2 PUFFS INTO THE LUNGS EVERY 4 HOURS AS NEEDED FOR WHEEZING OR SHORTNESS OF BREATH 6.7 g 1   amLODipine (NORVASC) 5 MG tablet TAKE 1 TABLET(5 MG) BY MOUTH DAILY. NO REFILLS NEEDS OFFICE VISIT 30 tablet 0   aspirin EC 81 MG  tablet Take 1 tablet (81 mg total) by mouth daily. 90 tablet 3   atorvastatin (LIPITOR) 20 MG tablet Take 1 tablet (20 mg total) by mouth daily. 90 tablet 3   doxepin (SINEQUAN) 25 MG capsule TAKE 1 CAPSULE(25 MG) BY MOUTH AT BEDTIME AS NEEDED FOR SLEEP OR ANXIETY 90 capsule 1   fexofenadine (ALLEGRA) 180 MG tablet Take 180 mg by mouth daily.     fluticasone (FLONASE) 50 MCG/ACT nasal spray Place 1 spray into both nostrils daily. 16 g 0   hydrochlorothiazide (HYDRODIURIL) 25 MG tablet TAKE 1 TABLET(25 MG) BY MOUTH DAILY. NEEDS APPOINTMENT FOR FURTHER REFILLS 30 tablet 0   methocarbamol (ROBAXIN) 500 MG tablet Take 1 tablet (500 mg total) by mouth 3 (three) times daily. 30 tablet 3   potassium chloride SA (KLOR-CON) 20 MEQ tablet TAKE 1 TABLET(20 MEQ) BY MOUTH TWICE DAILY 180 tablet 3   benzonatate (TESSALON) 200 MG capsule Take 1 capsule (200 mg total) by mouth 3 (three) times daily as needed for cough. 45 capsule 0   hydrOXYzine (ATARAX/VISTARIL) 50 MG tablet Take 0.5-1 tablets (25-50 mg total) by mouth at bedtime. 30 tablet 3   meloxicam (MOBIC) 15 MG tablet Take 1 tablet (15 mg total) by mouth daily. Do not start until finished with prednisone. 30 tablet 0   Pseudoephedrine-Guaifenesin (MUCINEX D MAX STRENGTH) [phone] MG TB12 Take 1 tablet by mouth in the morning and at bedtime. (Patient not taking: Reported on 08/26/2021) 20 tablet 0   No facility-administered medications prior to visit.    Allergies  Allergen Reactions   Codeine Hives and Itching    ROS Review of Systems    Objective:    Physical Exam Constitutional:      Appearance: She is well-developed.  HENT:     Head: Normocephalic and atraumatic.     Right Ear: Tympanic membrane, ear canal and external ear normal.     Left Ear: Tympanic membrane, ear canal and external ear normal.     Nose: Nose normal.     Mouth/Throat:     Mouth: Mucous membranes are moist.     Pharynx: Oropharynx is clear. No oropharyngeal exudate  or posterior oropharyngeal erythema.  Eyes:     Conjunctiva/sclera: Conjunctivae normal.     Pupils: Pupils are equal, round, and reactive to light.  Neck:     Thyroid: No thyromegaly.  Cardiovascular:     Rate and Rhythm: Normal rate and regular rhythm.     Heart sounds: Normal heart  sounds.  Pulmonary:     Effort: Pulmonary effort is normal.     Breath sounds: Normal breath sounds. No wheezing.  Musculoskeletal:     Cervical back: Neck supple.  Lymphadenopathy:     Cervical: No cervical adenopathy.  Skin:    General: Skin is warm and dry.  Neurological:     Mental Status: She is alert and oriented to person, place, and time.    BP 137/68   Pulse 95   Ht 5\' 6"  (1.676 m)   Wt 200 lb (90.7 kg)   LMP 01/24/2018 (Approximate)   SpO2 98%   BMI 32.28 kg/m  Wt Readings from Last 3 Encounters:  02/05/22 200 lb (90.7 kg)  08/26/21 203 lb 1.9 oz (92.1 kg)  03/14/21 197 lb 6.4 oz (89.5 kg)     Health Maintenance Due  Topic Date Due   Zoster Vaccines- Shingrix (1 of 2) Never done   COVID-19 Vaccine (3 - Booster for Pfizer series) 04/05/2020    There are no preventive care reminders to display for this patient.  Lab Results  Component Value Date   TSH 2.07 03/14/2021   Lab Results  Component Value Date   WBC 4.9 03/14/2021   HGB 13.8 03/14/2021   HCT 42.8 03/14/2021   MCV 86.1 03/14/2021   PLT 252 03/14/2021   Lab Results  Component Value Date   NA 141 03/14/2021   K 3.4 (L) 03/14/2021   CO2 30 03/14/2021   GLUCOSE 105 (H) 03/14/2021   BUN 9 03/14/2021   CREATININE 0.59 03/14/2021   BILITOT 0.4 03/14/2021   ALKPHOS 77 11/04/2016   AST 17 03/14/2021   ALT 13 03/14/2021   PROT 7.5 03/14/2021   ALBUMIN 4.1 11/04/2016   CALCIUM 9.5 03/14/2021   ANIONGAP 12 02/10/2017   Lab Results  Component Value Date   CHOL 181 03/14/2021   Lab Results  Component Value Date   HDL 43 (L) 03/14/2021   Lab Results  Component Value Date   LDLCALC 118 (H) 03/14/2021    Lab Results  Component Value Date   TRIG 96 03/14/2021   Lab Results  Component Value Date   CHOLHDL 4.2 03/14/2021   Lab Results  Component Value Date   HGBA1C 6.0 (H) 02/12/2020      Assessment & Plan:   Problem List Items Addressed This Visit   None Visit Diagnoses     Acute non-recurrent maxillary sinusitis    -  Primary   Relevant Medications   azithromycin (ZITHROMAX) 250 MG tablet   predniSONE (DELTASONE) 20 MG tablet   Seasonal allergic rhinitis, unspecified trigger           Acute sinusitis with seasonal allergies-we will go ahead and treat with azithromycin.  I am also going to add prednisone since I do think there is an allergic component to this and she just has a lot of pressure and congestion.  Call if not better in 1 week.  Continue with nasal saline rinse.  Meds ordered this encounter  Medications   azithromycin (ZITHROMAX) 250 MG tablet    Sig: 2 Ttabs PO on Day 1, then one a day x 4 days.    Dispense:  6 tablet    Refill:  0   predniSONE (DELTASONE) 20 MG tablet    Sig: Take 2 tablets (40 mg total) by mouth daily with breakfast.    Dispense:  10 tablet    Refill:  0    Follow-up: No  follow-ups on file.    Nani Gasser, MD

## 2022-02-19 ENCOUNTER — Other Ambulatory Visit: Payer: Self-pay | Admitting: Medical-Surgical

## 2022-02-19 DIAGNOSIS — I10 Essential (primary) hypertension: Secondary | ICD-10-CM

## 2022-02-19 DIAGNOSIS — Z9189 Other specified personal risk factors, not elsewhere classified: Secondary | ICD-10-CM

## 2022-02-19 DIAGNOSIS — G479 Sleep disorder, unspecified: Secondary | ICD-10-CM

## 2022-02-20 MED ORDER — DOXEPIN HCL 25 MG PO CAPS: ORAL_CAPSULE | ORAL | 0 refills | Status: DC

## 2022-02-21 ENCOUNTER — Other Ambulatory Visit: Payer: Self-pay | Admitting: Medical-Surgical

## 2022-02-21 DIAGNOSIS — Z9189 Other specified personal risk factors, not elsewhere classified: Secondary | ICD-10-CM

## 2022-02-21 DIAGNOSIS — I10 Essential (primary) hypertension: Secondary | ICD-10-CM

## 2022-03-21 ENCOUNTER — Other Ambulatory Visit: Payer: Self-pay | Admitting: Medical-Surgical

## 2022-03-21 DIAGNOSIS — G479 Sleep disorder, unspecified: Secondary | ICD-10-CM

## 2022-03-29 ENCOUNTER — Encounter: Payer: Self-pay | Admitting: Medical-Surgical

## 2022-03-30 ENCOUNTER — Telehealth (INDEPENDENT_AMBULATORY_CARE_PROVIDER_SITE_OTHER): Payer: Federal, State, Local not specified - PPO | Admitting: Medical-Surgical

## 2022-03-30 ENCOUNTER — Encounter: Payer: Self-pay | Admitting: Medical-Surgical

## 2022-03-30 DIAGNOSIS — G43009 Migraine without aura, not intractable, without status migrainosus: Secondary | ICD-10-CM

## 2022-03-30 DIAGNOSIS — E876 Hypokalemia: Secondary | ICD-10-CM

## 2022-03-30 DIAGNOSIS — J0140 Acute pansinusitis, unspecified: Secondary | ICD-10-CM

## 2022-03-30 DIAGNOSIS — J302 Other seasonal allergic rhinitis: Secondary | ICD-10-CM

## 2022-03-30 MED ORDER — FLUTICASONE PROPIONATE 50 MCG/ACT NA SUSP: 1.0000 | Freq: Every day | NASAL | 0 refills | Status: DC

## 2022-03-30 MED ORDER — ALBUTEROL SULFATE HFA 108 (90 BASE) MCG/ACT IN AERS: 2.0000 | INHALATION_SPRAY | RESPIRATORY_TRACT | 1 refills | Status: DC | PRN

## 2022-03-30 MED ORDER — SUMATRIPTAN SUCCINATE 50 MG PO TABS: 50.0000 mg | ORAL_TABLET | ORAL | 0 refills | Status: DC | PRN

## 2022-03-30 MED ORDER — METHOCARBAMOL 500 MG PO TABS: 500.0000 mg | ORAL_TABLET | Freq: Three times a day (TID) | ORAL | 3 refills | Status: DC

## 2022-03-30 MED ORDER — POTASSIUM CHLORIDE CRYS ER 20 MEQ PO TBCR: 20.0000 meq | EXTENDED_RELEASE_TABLET | Freq: Every day | ORAL | 3 refills | Status: DC

## 2022-03-30 MED ORDER — AZITHROMYCIN 250 MG PO TABS: ORAL_TABLET | ORAL | 0 refills | Status: AC

## 2022-03-30 NOTE — Telephone Encounter (Signed)
Pt has been scheduled for a virtual visit. AMUCK ?

## 2022-03-30 NOTE — Progress Notes (Signed)
Virtual Visit via Video Note ? ?I connected with Hailey Robinson on 03/30/22 at  9:50 AM EDT by a video enabled telemedicine application and verified that I am speaking with the correct person using two identifiers. ?  ?I discussed the limitations of evaluation and management by telemedicine and the availability of in person appointments. The patient expressed understanding and agreed to proceed. ? ?Patient location: home ?Provider locations: office ? ?Subjective:   ? ?CC: Migraine, sinuses ? ?HPI: ?Pleasant 61 year old female presenting via MyChart video visit for the following: ? ?Migraine-recently had an uptick in her migraines and has had 2 deal with migraine type symptoms almost daily for the last week.  Notes that she knows this is in part related to worsening of her allergies.  Last week, she had a migraine that kept her in bed for a couple of days and was associated with right sided neck pain, right frontal head pain, photophobia, and phonophobia.  She tried taking Tylenol but this was unhelpful.  She then took Excedrin Migraine which was beneficial.  She has been doing regular normal saline rinses post sinus surgery and following instructions provided by ENT.  She is not currently taking a preventative medication or an abortive 1.  Notes that she is never taken a prescription abortive medication. ? ?For the last week, has had worsening of her overall allergy symptoms but now it has not responded to her typical measures.  She is struggling with significant right sided sinus congestion, nasal drainage, postnasal drip and a cough productive of thick yellow mucus.  She has facial pain in the maxillary area.  Denies fever, chills, shortness of breath, chest pain.  No GI symptoms. ? ?Past medical history, Surgical history, Family history not pertinant except as noted below, Social history, Allergies, and medications have been entered into the medical record, reviewed, and corrections made.  ? ?Review of  Systems: See HPI for pertinent positives and negatives.  ? ?Objective:   ? ?General: Speaking clearly in complete sentences without any shortness of breath.  Alert and oriented x3.  Normal judgment. No apparent acute distress. ? ?Impression and Recommendations:   ? ?1. Acute non-recurrent pansinusitis ?With the level of symptoms and the length of time this has been exacerbated, we will plan to treat with azithromycin 2 tablets today then 1 tablet daily over the next 4 days.  Continue home measures and conservative treatment. ?- azithromycin (ZITHROMAX) 250 MG tablet; Take 2 tablets on day 1, then 1 tablet daily on days 2 through 5  Dispense: 6 tablet; Refill: 0 ? ?2. Migraine without aura and without status migrainosus, not intractable ?Likely exacerbated by recent sinus symptoms.  Sending in methocarbamol as this may help some with her migraines.  Okay to use Excedrin Migraine as needed.  Sending in sumatriptan to have on hand just in case Excedrin Migraine is not helpful. ?- methocarbamol (ROBAXIN) 500 MG tablet; Take 1 tablet (500 mg total) by mouth 3 (three) times daily.  Dispense: 30 tablet; Refill: 3 ?- SUMAtriptan (IMITREX) 50 MG tablet; Take 1 tablet (50 mg total) by mouth every 2 (two) hours as needed for migraine. May repeat in 2 hours if headache persists or recurs. Maximum daily dose: 2 tablets ('100mg'$ ).  Dispense: 10 tablet; Refill: 0 ? ?3. Hypokalemia due to excessive renal loss of potassium ?Refilling potassium chloride. ?- potassium chloride SA (KLOR-CON M) 20 MEQ tablet; Take 1 tablet (20 mEq total) by mouth daily.  Dispense: 180 tablet; Refill: 3 ? ?4. Seasonal  allergic rhinitis, unspecified trigger ?Continue fluticasone and albuterol inhaler as prescribed. ?- albuterol (VENTOLIN HFA) 108 (90 Base) MCG/ACT inhaler; Inhale 2 puffs into the lungs every 4 (four) hours as needed for wheezing or shortness of breath.  Dispense: 6.7 g; Refill: 1 ?- fluticasone (FLONASE) 50 MCG/ACT nasal spray; Place 1  spray into both nostrils daily.  Dispense: 16 g; Refill: 0 ? ? ?I discussed the assessment and treatment plan with the patient. The patient was provided an opportunity to ask questions and all were answered. The patient agreed with the plan and demonstrated an understanding of the instructions. ?  ?The patient was advised to call back or seek an in-person evaluation if the symptoms worsen or if the condition fails to improve as anticipated. ? ?25 minutes of non-face-to-face time was provided during this encounter. ? ?Return in about 3 months (around 06/30/2022) for chronic disease follow up (in office preferred). ? ?Clearnce Sorrel, DNP, APRN, FNP-BC ?[place] ?[facility] and [facility] Medicine ?

## 2022-03-30 NOTE — Progress Notes (Signed)
Migraines and sinuses clogged. Right side of head clogged. SOB when coughing. Haven't been able to sleep. Last week she was in the bed till about Thursday. She couldn't stand the light or hear anything crinkling her head hurt so bad.  ?

## 2022-04-16 ENCOUNTER — Other Ambulatory Visit: Payer: Self-pay | Admitting: Medical-Surgical

## 2022-04-16 DIAGNOSIS — I10 Essential (primary) hypertension: Secondary | ICD-10-CM

## 2022-04-16 DIAGNOSIS — Z9189 Other specified personal risk factors, not elsewhere classified: Secondary | ICD-10-CM

## 2022-04-17 ENCOUNTER — Telehealth: Payer: Self-pay | Admitting: Medical-Surgical

## 2022-04-17 NOTE — Telephone Encounter (Signed)
Patient dropped off FMLA paperwork to be filled out, billing form attached and placed in provider box. AMUCK *04/17/22

## 2022-04-20 ENCOUNTER — Other Ambulatory Visit: Payer: Self-pay | Admitting: Medical-Surgical

## 2022-04-20 DIAGNOSIS — G479 Sleep disorder, unspecified: Secondary | ICD-10-CM

## 2022-04-21 NOTE — Telephone Encounter (Signed)
Arbie Cookey called patient & made copies. Billing form with original placed in accordion waiting for patient pick up.

## 2022-04-21 NOTE — Telephone Encounter (Signed)
Contacted patient to advise that her FMLA forms are complete and ready for pick up. She is aware of the $29 form fee.

## 2022-04-25 ENCOUNTER — Other Ambulatory Visit: Payer: Self-pay | Admitting: Medical-Surgical

## 2022-04-25 ENCOUNTER — Other Ambulatory Visit: Payer: Self-pay | Admitting: Family Medicine

## 2022-04-25 DIAGNOSIS — G8929 Other chronic pain: Secondary | ICD-10-CM

## 2022-04-25 DIAGNOSIS — J302 Other seasonal allergic rhinitis: Secondary | ICD-10-CM

## 2022-04-25 DIAGNOSIS — M79672 Pain in left foot: Secondary | ICD-10-CM

## 2022-05-14 ENCOUNTER — Encounter: Payer: Self-pay | Admitting: Medical-Surgical

## 2022-05-20 ENCOUNTER — Other Ambulatory Visit: Payer: Self-pay | Admitting: Medical-Surgical

## 2022-05-20 DIAGNOSIS — G479 Sleep disorder, unspecified: Secondary | ICD-10-CM

## 2022-06-11 ENCOUNTER — Other Ambulatory Visit: Payer: Self-pay | Admitting: Medical-Surgical

## 2022-06-11 DIAGNOSIS — I10 Essential (primary) hypertension: Secondary | ICD-10-CM

## 2022-06-11 DIAGNOSIS — Z9189 Other specified personal risk factors, not elsewhere classified: Secondary | ICD-10-CM

## 2022-06-19 ENCOUNTER — Other Ambulatory Visit: Payer: Self-pay | Admitting: Medical-Surgical

## 2022-06-19 DIAGNOSIS — G479 Sleep disorder, unspecified: Secondary | ICD-10-CM

## 2022-06-25 ENCOUNTER — Other Ambulatory Visit: Payer: Self-pay | Admitting: Medical-Surgical

## 2022-06-25 DIAGNOSIS — Z9189 Other specified personal risk factors, not elsewhere classified: Secondary | ICD-10-CM

## 2022-06-25 DIAGNOSIS — I10 Essential (primary) hypertension: Secondary | ICD-10-CM

## 2022-06-29 ENCOUNTER — Other Ambulatory Visit: Payer: Self-pay | Admitting: Medical-Surgical

## 2022-06-29 DIAGNOSIS — J302 Other seasonal allergic rhinitis: Secondary | ICD-10-CM

## 2022-07-01 ENCOUNTER — Other Ambulatory Visit: Payer: Self-pay

## 2022-07-01 DIAGNOSIS — I10 Essential (primary) hypertension: Secondary | ICD-10-CM

## 2022-07-01 DIAGNOSIS — Z9189 Other specified personal risk factors, not elsewhere classified: Secondary | ICD-10-CM

## 2022-07-01 MED ORDER — HYDROCHLOROTHIAZIDE 25 MG PO TABS: ORAL_TABLET | ORAL | 0 refills | Status: DC

## 2022-07-19 ENCOUNTER — Other Ambulatory Visit: Payer: Self-pay | Admitting: Medical-Surgical

## 2022-07-19 DIAGNOSIS — G479 Sleep disorder, unspecified: Secondary | ICD-10-CM

## 2022-07-26 ENCOUNTER — Other Ambulatory Visit: Payer: Self-pay | Admitting: Medical-Surgical

## 2022-07-26 DIAGNOSIS — Z9189 Other specified personal risk factors, not elsewhere classified: Secondary | ICD-10-CM

## 2022-07-26 DIAGNOSIS — I10 Essential (primary) hypertension: Secondary | ICD-10-CM

## 2022-07-28 DIAGNOSIS — M5442 Lumbago with sciatica, left side: Secondary | ICD-10-CM | POA: Diagnosis not present

## 2022-08-02 NOTE — Progress Notes (Unsigned)
   Established Patient Office Visit  Subjective   Patient ID: Hailey Robinson, female   DOB: [birth year] Age: 61 y.o. MRN: [id number]   No chief complaint on file.   HPI Pleasant 61 year old female presenting today for the following:  HTN:  Prediabetes:  HLD:  Insomnia:  Migraine:   Objective:    There were no vitals filed for this visit.  Physical Exam   No results found for this or any previous visit (from the past 24 hour(s)).   {Labs (Optional):23779}  The 10-year ASCVD risk score (Arnett DK, et al., 2019) is: 18.3%   Values used to calculate the score:     Age: 39 years     Sex: Female     Is Non-Hispanic African American: Yes     Diabetic: No     Tobacco smoker: Yes     Systolic Blood Pressure: 539 mmHg     Is BP treated: Yes     HDL Cholesterol: 43 mg/dL     Total Cholesterol: 181 mg/dL   Assessment & Plan:   No problem-specific Assessment & Plan notes found for this encounter.   No follow-ups on file.  ___________________________________________ Clearnce Sorrel, DNP, APRN, FNP-BC [facility] [place]

## 2022-08-03 ENCOUNTER — Ambulatory Visit: Payer: Federal, State, Local not specified - PPO | Admitting: Medical-Surgical

## 2022-08-03 ENCOUNTER — Encounter: Payer: Self-pay | Admitting: Medical-Surgical

## 2022-08-03 VITALS — BP 139/84 | HR 86 | Wt 199.0 lb

## 2022-08-03 DIAGNOSIS — G4700 Insomnia, unspecified: Secondary | ICD-10-CM

## 2022-08-03 DIAGNOSIS — G43009 Migraine without aura, not intractable, without status migrainosus: Secondary | ICD-10-CM

## 2022-08-03 DIAGNOSIS — R7303 Prediabetes: Secondary | ICD-10-CM

## 2022-08-03 DIAGNOSIS — I10 Essential (primary) hypertension: Secondary | ICD-10-CM

## 2022-08-03 DIAGNOSIS — E785 Hyperlipidemia, unspecified: Secondary | ICD-10-CM

## 2022-08-03 MED ORDER — CHLORTHALIDONE 25 MG PO TABS: 25.0000 mg | ORAL_TABLET | Freq: Every day | ORAL | 1 refills | Status: DC

## 2022-08-03 MED ORDER — ZOLPIDEM TARTRATE 5 MG PO TABS
5.0000 mg | ORAL_TABLET | Freq: Every evening | ORAL | 0 refills | Status: DC | PRN
Start: 2022-08-03 — End: 2022-08-25

## 2022-08-05 ENCOUNTER — Other Ambulatory Visit: Payer: Self-pay | Admitting: Medical-Surgical

## 2022-08-05 DIAGNOSIS — J302 Other seasonal allergic rhinitis: Secondary | ICD-10-CM

## 2022-08-06 MED ORDER — FLUTICASONE PROPIONATE 50 MCG/ACT NA SUSP: 1.0000 | Freq: Every day | NASAL | 0 refills | Status: DC

## 2022-08-18 ENCOUNTER — Other Ambulatory Visit: Payer: Self-pay | Admitting: Medical-Surgical

## 2022-08-18 DIAGNOSIS — G479 Sleep disorder, unspecified: Secondary | ICD-10-CM

## 2022-08-20 DIAGNOSIS — R6884 Jaw pain: Secondary | ICD-10-CM | POA: Diagnosis not present

## 2022-08-20 DIAGNOSIS — K047 Periapical abscess without sinus: Secondary | ICD-10-CM | POA: Diagnosis not present

## 2022-08-20 DIAGNOSIS — K122 Cellulitis and abscess of mouth: Secondary | ICD-10-CM | POA: Diagnosis not present

## 2022-08-20 DIAGNOSIS — R600 Localized edema: Secondary | ICD-10-CM | POA: Diagnosis not present

## 2022-08-25 ENCOUNTER — Ambulatory Visit (INDEPENDENT_AMBULATORY_CARE_PROVIDER_SITE_OTHER): Payer: Federal, State, Local not specified - PPO | Admitting: Medical-Surgical

## 2022-08-25 ENCOUNTER — Encounter: Payer: Self-pay | Admitting: Medical-Surgical

## 2022-08-25 VITALS — BP 120/70 | HR 91 | Resp 20 | Ht 66.0 in | Wt 197.1 lb

## 2022-08-25 DIAGNOSIS — I10 Essential (primary) hypertension: Secondary | ICD-10-CM

## 2022-08-25 DIAGNOSIS — R7303 Prediabetes: Secondary | ICD-10-CM

## 2022-08-25 DIAGNOSIS — Z1239 Encounter for other screening for malignant neoplasm of breast: Secondary | ICD-10-CM | POA: Diagnosis not present

## 2022-08-25 DIAGNOSIS — E785 Hyperlipidemia, unspecified: Secondary | ICD-10-CM

## 2022-08-25 DIAGNOSIS — Z23 Encounter for immunization: Secondary | ICD-10-CM | POA: Diagnosis not present

## 2022-08-25 DIAGNOSIS — G43009 Migraine without aura, not intractable, without status migrainosus: Secondary | ICD-10-CM

## 2022-08-25 DIAGNOSIS — Z0000 Encounter for general adult medical examination without abnormal findings: Secondary | ICD-10-CM | POA: Diagnosis not present

## 2022-08-25 DIAGNOSIS — K080 Exfoliation of teeth due to systemic causes: Secondary | ICD-10-CM | POA: Diagnosis not present

## 2022-08-25 DIAGNOSIS — E876 Hypokalemia: Secondary | ICD-10-CM

## 2022-08-25 DIAGNOSIS — E890 Postprocedural hypothyroidism: Secondary | ICD-10-CM

## 2022-08-25 DIAGNOSIS — J42 Unspecified chronic bronchitis: Secondary | ICD-10-CM

## 2022-08-25 MED ORDER — ZOLPIDEM TARTRATE 5 MG PO TABS: 5.0000 mg | ORAL_TABLET | Freq: Every evening | ORAL | 5 refills | Status: DC | PRN

## 2022-08-25 NOTE — Progress Notes (Signed)
Complete physical exam  Patient: Hailey Robinson   DOB: 12/10/1960   61 y.o. Female  MRN: [id number]  Subjective:    Chief Complaint  Patient presents with   Annual Exam   Hailey Robinson is a 61 y.o. female who presents today for a complete physical exam. She reports consuming a general diet. The patient has a physically strenuous job, but has no regular exercise apart from work.  She generally feels well. She reports sleeping well. She does not have additional problems to discuss today.   Most recent fall risk assessment:    08/03/2022    1:53 PM  [place] in the past year? 0  Number falls in past yr: 0  Injury with Fall? 0  Risk for fall due to : No Fall Risks  Follow up Falls evaluation completed     Most recent depression screenings:    08/03/2022    1:53 PM 02/05/2022   11:21 AM  PHQ 2/9 Scores  PHQ - 2 Score 0 0    Vision:Within last year, Dental: No current dental problems and Receives regular dental care, and STD: The patient denies history of sexually transmitted disease.    Patient Care Team: Samuel Bouche, NP as PCP - General (Nurse Practitioner) Philemon Kingdom, MD as Consulting Physician (Endocrinology) Ernestine Conrad, MD as Consulting Physician (Otolaryngology) Amil Amen, MD as Consulting Physician (Neurology)   Outpatient Medications Prior to Visit  Medication Sig   albuterol (VENTOLIN HFA) 108 (90 Base) MCG/ACT inhaler INHALE 2 PUFFS INTO THE LUNGS EVERY 4 HOURS AS NEEDED FOR WHEEZING OR SHORTNESS OF BREATH   amLODipine (NORVASC) 5 MG tablet TAKE 1 TABLET(5 MG) BY MOUTH DAILY   aspirin EC 81 MG tablet Take 1 tablet (81 mg total) by mouth daily.   atorvastatin (LIPITOR) 20 MG tablet Take 1 tablet (20 mg total) by mouth daily.   chlorthalidone (HYGROTON) 25 MG tablet Take 1 tablet (25 mg total) by mouth daily.   doxepin (SINEQUAN) 25 MG capsule TAKE 1 CAPSULE BY MOUTH AT BEDTIME AS NEEDED FOR SLEEP OR ANXIETY   fexofenadine  (ALLEGRA) 180 MG tablet Take 180 mg by mouth daily.   fluticasone (FLONASE) 50 MCG/ACT nasal spray Place 1 spray into both nostrils daily.   methocarbamol (ROBAXIN) 500 MG tablet Take 1 tablet (500 mg total) by mouth 3 (three) times daily.   potassium chloride SA (KLOR-CON M) 20 MEQ tablet Take 1 tablet (20 mEq total) by mouth daily.   SUMAtriptan (IMITREX) 50 MG tablet Take 1 tablet (50 mg total) by mouth every 2 (two) hours as needed for migraine. May repeat in 2 hours if headache persists or recurs. Maximum daily dose: 2 tablets ('100mg'$ ).   [DISCONTINUED] zolpidem (AMBIEN) 5 MG tablet Take 1 tablet (5 mg total) by mouth at bedtime as needed for sleep.   No facility-administered medications prior to visit.    Review of Systems  Constitutional:  Negative for chills, fever, malaise/fatigue and weight loss.  HENT:  Negative for congestion, ear pain, hearing loss, sinus pain and sore throat.   Eyes:  Negative for blurred vision, photophobia and pain.  Respiratory:  Negative for cough, shortness of breath and wheezing.   Cardiovascular:  Negative for chest pain, palpitations and leg swelling.  Gastrointestinal:  Negative for abdominal pain, constipation, diarrhea, heartburn, nausea and vomiting.  Genitourinary:  Negative for dysuria, frequency and urgency.  Musculoskeletal:  Negative for falls and neck pain.  Skin:  Negative for itching  and rash.  Neurological:  Negative for dizziness, weakness and headaches.  Endo/Heme/Allergies:  Negative for polydipsia. Does not bruise/bleed easily.  Psychiatric/Behavioral:  Negative for depression, substance abuse and suicidal ideas. The patient is not nervous/anxious.      Objective:     BP 120/70   Pulse 91   Resp 20   Ht '5\' 6"'$  (1.676 m)   Wt 197 lb 1.6 oz (89.4 kg)   LMP 01/24/2018 (Approximate)   SpO2 98%   BMI 31.81 kg/m    Physical Exam Constitutional:      General: She is not in acute distress.    Appearance: Normal appearance. She is  obese. She is not ill-appearing.  HENT:     Head: Normocephalic and atraumatic.     Right Ear: Tympanic membrane, ear canal and external ear normal. There is no impacted cerumen.     Left Ear: Tympanic membrane, ear canal and external ear normal. There is no impacted cerumen.     Nose: Nose normal. No congestion or rhinorrhea.     Mouth/Throat:     Mouth: Mucous membranes are moist.     Pharynx: No oropharyngeal exudate or posterior oropharyngeal erythema.  Eyes:     General: No scleral icterus.       Right eye: No discharge.        Left eye: No discharge.     Extraocular Movements: Extraocular movements intact.     Conjunctiva/sclera: Conjunctivae normal.     Pupils: Pupils are equal, round, and reactive to light.  Neck:     Thyroid: No thyromegaly.     Vascular: No carotid bruit or JVD.     Trachea: Trachea normal.  Cardiovascular:     Rate and Rhythm: Normal rate and regular rhythm.     Pulses: Normal pulses.     Heart sounds: Normal heart sounds. No murmur heard.    No friction rub. No gallop.  Pulmonary:     Effort: Pulmonary effort is normal. No respiratory distress.     Breath sounds: Normal breath sounds. No wheezing.  Abdominal:     General: Bowel sounds are normal. There is no distension.     Palpations: Abdomen is soft.     Tenderness: There is no abdominal tenderness. There is no guarding.  Musculoskeletal:        General: Normal range of motion.     Cervical back: Normal range of motion and neck supple.     Right lower leg: No edema.     Left lower leg: No edema.  Lymphadenopathy:     Cervical: No cervical adenopathy.  Skin:    General: Skin is warm and dry.  Neurological:     Mental Status: She is alert and oriented to person, place, and time.     Cranial Nerves: No cranial nerve deficit.  Psychiatric:        Mood and Affect: Mood normal.        Behavior: Behavior normal.        Thought Content: Thought content normal.        Judgment: Judgment normal.    No results found for any visits on 08/25/22.     Assessment & Plan:    Routine Health Maintenance and Physical Exam  Immunization History  Administered Date(s) Administered   Influenza-Unspecified 09/07/2019, 09/25/2020, 08/07/2022   PFIZER(Purple Top)SARS-COV-2 Vaccination 01/15/2020, 02/09/2020    Health Maintenance  Topic Date Due   Zoster Vaccines- Shingrix (1 of 2) Never done  MAMMOGRAM  03/13/2022   COVID-19 Vaccine (3 - Pfizer series) 09/10/2022 (Originally 04/05/2020)   PAP SMEAR-Modifier  02/12/2023   TETANUS/TDAP  03/01/2023   COLONOSCOPY (Pts 45-51yr Insurance coverage will need to be confirmed)  04/10/2027   INFLUENZA VACCINE  Completed   Hepatitis C Screening  Completed   HIV Screening  Completed   HPV VACCINES  Aged Out    Discussed health benefits of physical activity, and encouraged her to engage in regular exercise appropriate for her age and condition.  1. Annual physical exam Checking labs as below.  Up-to-date on preventative care.  Wellness information provided with AVS. - Lipid panel - COMPLETE METABOLIC PANEL WITH GFR - CBC with Differential/Platelet  2. Encounter for breast cancer screening using non-mammogram modality Per review of her last breast imaging in 03/2020, she was recommended to have a breast MRI bilaterally.  This was never completed or followed up on.  Ordering an MRI of the breasts bilaterally today for further evaluation. - MR BREAST BILATERAL W WO CONTRAST INC CAD; Future  3. Need for influenza vaccination Declined today.  She got this at work on 08/07/2022.  5. Prediabetes Checking hemoglobin A1c. - Hemoglobin A1c  6. Dyslipidemia, goal LDL below 70 Checking labs as below.  Continue atorvastatin 20 mg daily. - Lipid panel - COMPLETE METABOLIC PANEL WITH GFR  7. Status post partial thyroidectomy Checking TSH. - TSH  8. Essential hypertension Checking labs as below.  Blood pressure at goal today.  Continue  chlorthalidone 25 mg daily and amlodipine 5 mg daily. - Lipid panel - COMPLETE METABOLIC PANEL WITH GFR - CBC with Differential/Platelet  9. Migraine without aura and without status migrainosus, not intractable Stable on current treatment.  Continue as needed Imitrex.  10. Chronic bronchitis, unspecified chronic bronchitis type (HCC) Continue Allegra 180 mg daily, Flonase, and albuterol inhaler as needed.  Return in about 6 months (around 02/24/2023) for chronic disease follow up or sooner if needed.   JSamuel Bouche NP

## 2022-08-26 LAB — CBC WITH DIFFERENTIAL/PLATELET
Absolute Monocytes: 400 cells/uL (ref 200–950)
Basophils Absolute: 22 cells/uL (ref 0–200)
Basophils Relative: 0.5 %
Eosinophils Absolute: 69 cells/uL (ref 15–500)
Eosinophils Relative: 1.6 %
HCT: 40.2 % (ref 35.0–45.0)
Hemoglobin: 13.6 g/dL (ref 11.7–15.5)
Lymphs Abs: 1346 cells/uL (ref 850–3900)
MCH: 29.2 pg (ref 27.0–33.0)
MCHC: 33.8 g/dL (ref 32.0–36.0)
MCV: 86.5 fL (ref 80.0–100.0)
MPV: 10.4 fL (ref 7.5–12.5)
Monocytes Relative: 9.3 %
Neutro Abs: 2464 cells/uL (ref 1500–7800)
Neutrophils Relative %: 57.3 %
Platelets: 235 10*3/uL (ref 140–400)
RBC: 4.65 10*6/uL (ref 3.80–5.10)
RDW: 13.7 % (ref 11.0–15.0)
Total Lymphocyte: 31.3 %
WBC: 4.3 10*3/uL (ref 3.8–10.8)

## 2022-08-26 LAB — HEMOGLOBIN A1C
Hgb A1c MFr Bld: 6.4 % of total Hgb — ABNORMAL HIGH (ref <5.7)
Mean Plasma Glucose: 137 mg/dL
eAG (mmol/L): 7.6 mmol/L

## 2022-08-26 LAB — LIPID PANEL
Cholesterol: 128 mg/dL (ref <200)
HDL: 44 mg/dL — ABNORMAL LOW (ref >=50)
LDL Cholesterol (Calc): 70 mg/dL (calc)
Non-HDL Cholesterol (Calc): 84 mg/dL (calc) (ref <130)
Total CHOL/HDL Ratio: 2.9 (calc) (ref <5.0)
Triglycerides: 67 mg/dL (ref <150)

## 2022-08-26 LAB — COMPLETE METABOLIC PANEL WITH GFR
AG Ratio: 1.5 (calc) (ref 1.0–2.5)
ALT: 12 U/L (ref 6–29)
AST: 15 U/L (ref 10–35)
Albumin: 4.5 g/dL (ref 3.6–5.1)
Alkaline phosphatase (APISO): 77 U/L (ref 37–153)
BUN: 12 mg/dL (ref 7–25)
CO2: 31 mmol/L (ref 20–32)
Calcium: 10.1 mg/dL (ref 8.6–10.4)
Chloride: 97 mmol/L — ABNORMAL LOW (ref 98–110)
Creat: 0.61 mg/dL (ref 0.50–1.05)
Globulin: 3 g/dL (calc) (ref 1.9–3.7)
Glucose, Bld: 109 mg/dL — ABNORMAL HIGH (ref 65–99)
Potassium: 2.9 mmol/L — ABNORMAL LOW (ref 3.5–5.3)
Sodium: 141 mmol/L (ref 135–146)
Total Bilirubin: 0.4 mg/dL (ref 0.2–1.2)
Total Protein: 7.5 g/dL (ref 6.1–8.1)
eGFR: 102 mL/min/{1.73_m2} (ref >=60)

## 2022-08-26 LAB — TSH: TSH: 2.35 mIU/L (ref 0.40–4.50)

## 2022-08-26 MED ORDER — METFORMIN HCL ER (MOD) 500 MG PO TB24: 500.0000 mg | ORAL_TABLET | Freq: Every day | ORAL | 1 refills | Status: DC

## 2022-08-26 NOTE — Addendum Note (Signed)
Addended bySamuel Bouche on: 08/26/2022 07:45 PM   Modules accepted: Orders

## 2022-08-26 NOTE — Addendum Note (Signed)
Addended bySamuel Bouche on: 08/26/2022 07:40 PM   Modules accepted: Orders

## 2022-08-31 ENCOUNTER — Telehealth: Payer: Self-pay

## 2022-08-31 NOTE — Telephone Encounter (Signed)
Initiated Prior authorization DHR:CBULAGTXM HCl ER (MOD) '500MG'$  er tablets Via: Covermymeds Case/Key:[id number] Status: Pending as of 08/31/22 Reason: Notified Pt via: Pt does not have Mychart

## 2022-09-04 ENCOUNTER — Other Ambulatory Visit: Payer: Self-pay

## 2022-09-04 DIAGNOSIS — E876 Hypokalemia: Secondary | ICD-10-CM

## 2022-09-04 MED ORDER — POTASSIUM CHLORIDE CRYS ER 20 MEQ PO TBCR: 20.0000 meq | EXTENDED_RELEASE_TABLET | Freq: Every day | ORAL | 3 refills | Status: DC

## 2022-09-17 ENCOUNTER — Other Ambulatory Visit: Payer: Self-pay | Admitting: Medical-Surgical

## 2022-09-17 DIAGNOSIS — G479 Sleep disorder, unspecified: Secondary | ICD-10-CM

## 2022-09-30 ENCOUNTER — Other Ambulatory Visit: Payer: Self-pay | Admitting: Medical-Surgical

## 2022-09-30 DIAGNOSIS — Z9189 Other specified personal risk factors, not elsewhere classified: Secondary | ICD-10-CM

## 2022-09-30 DIAGNOSIS — E876 Hypokalemia: Secondary | ICD-10-CM

## 2022-09-30 DIAGNOSIS — I10 Essential (primary) hypertension: Secondary | ICD-10-CM

## 2022-09-30 DIAGNOSIS — J302 Other seasonal allergic rhinitis: Secondary | ICD-10-CM

## 2022-10-17 ENCOUNTER — Other Ambulatory Visit: Payer: Self-pay | Admitting: Medical-Surgical

## 2022-10-17 DIAGNOSIS — G479 Sleep disorder, unspecified: Secondary | ICD-10-CM

## 2022-11-27 ENCOUNTER — Other Ambulatory Visit: Payer: Self-pay | Admitting: Medical-Surgical

## 2022-11-27 DIAGNOSIS — J302 Other seasonal allergic rhinitis: Secondary | ICD-10-CM

## 2022-12-29 ENCOUNTER — Encounter: Payer: Self-pay | Admitting: Medical-Surgical

## 2022-12-29 ENCOUNTER — Ambulatory Visit (INDEPENDENT_AMBULATORY_CARE_PROVIDER_SITE_OTHER): Payer: Federal, State, Local not specified - PPO

## 2022-12-29 ENCOUNTER — Ambulatory Visit (INDEPENDENT_AMBULATORY_CARE_PROVIDER_SITE_OTHER): Payer: Self-pay | Admitting: Medical-Surgical

## 2022-12-29 VITALS — BP 118/71 | HR 89 | Resp 20 | Ht 66.0 in | Wt 197.6 lb

## 2022-12-29 DIAGNOSIS — S4991XD Unspecified injury of right shoulder and upper arm, subsequent encounter: Secondary | ICD-10-CM

## 2022-12-29 DIAGNOSIS — S4991XA Unspecified injury of right shoulder and upper arm, initial encounter: Secondary | ICD-10-CM | POA: Diagnosis not present

## 2022-12-29 DIAGNOSIS — Z1231 Encounter for screening mammogram for malignant neoplasm of breast: Secondary | ICD-10-CM

## 2022-12-29 MED ORDER — MELOXICAM 15 MG PO TABS: 15.0000 mg | ORAL_TABLET | Freq: Every day | ORAL | 0 refills | Status: DC

## 2022-12-29 MED ORDER — CYCLOBENZAPRINE HCL 10 MG PO TABS: 5.0000 mg | ORAL_TABLET | Freq: Three times a day (TID) | ORAL | 1 refills | Status: DC | PRN

## 2022-12-29 NOTE — Progress Notes (Signed)
Established Patient Office Visit  Subjective   Patient ID: Hailey Robinson, female   DOB: June 01, 1961 Age: 62 y.o. MRN: [id number]   Chief Complaint  Patient presents with   Fall    Workers Comp    HPI Pleasant 62 year old female presenting today for evaluation of right shoulder pain after an injury at work.  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  While she was working, they were instructed to get the patient out of bed.  They were using a sit to stand lift however the patient was unable to follow directions and they had a period of difficulty while trying to avoid him falling.  She ended up with her right arm/shoulder getting pulled.  The pain set in that evening.  The next day, she went to occupational health where they prescribed her prednisone, lidocaine patches, and a topical cream for pain.  She took the medication as prescribed however reports that it had only mild effect on her symptoms.  She continues to have weakness in the shoulder along with shoulder pain.  Having difficulty lifting her arm overhead and has increased pain with any range of motion.  Was told by the [place] occupational health department that she would need to follow-up with her primary care since they can only manage these issues for approximately 3 days.  Today she reports that she has been out of work since 12/16/2022 as she felt that there was no way she could go back in and prolong patients.  She was placed on light duty by occupational health however since then has been released from their care.  She spoke with them about her symptoms and was advised to follow-up with her PCP for any paperwork or care recommendations that may be made.  To date, has had no imaging or physical therapy.   Objective:    Vitals:   12/29/22 1316  BP: 118/71  Pulse: 89  Resp: 20  Height: '5\' 6"'$  (1.676 m)  Weight: 197 lb 9.6 oz (89.6 kg)  SpO2: 97%  BMI (Calculated): 31.91     Physical Exam Vitals and nursing note reviewed.  Constitutional:      General: She is not in acute distress.    Appearance: Normal appearance. She is not ill-appearing.  HENT:     Head: Normocephalic and atraumatic.  Cardiovascular:     Rate and Rhythm: Normal rate and regular rhythm.     Pulses: Normal pulses.     Heart sounds: Normal heart sounds.  Pulmonary:     Effort: Pulmonary effort is normal. No respiratory distress.     Breath sounds: Normal breath sounds. No wheezing, rhonchi or rales.  Musculoskeletal:     Right shoulder: Tenderness present. Decreased range of motion. Decreased strength.     Comments: Right shoulder range of motion limited by pain with abduction, adduction, flexion, and extension.  Tenderness to palpation over the trapezius into the posterior right shoulder.  Skin:    General: Skin is warm and dry.  Neurological:     Mental Status: She is alert and oriented to person, place, and time.  Psychiatric:        Mood and Affect: Mood normal.        Behavior: Behavior normal.        Thought Content: Thought content normal.        Judgment: Judgment normal.   No results found for this or any previous visit (from  the past 24 hour(s)).     The ASCVD Risk score (Arnett DK, et al., 2019) failed to calculate for the following reasons:   The valid total cholesterol range is 130 to 320 mg/dL   Assessment & Plan:   1. Injury of right shoulder, subsequent encounter Get the x-rays of the right shoulder today.  Per report, no bony abnormality.  Feel that her injury is related to potential rotator cuff tear versus muscle strain.  Adding meloxicam 15 mg daily.  Okay to use Tylenol as needed.  Adding cyclobenzaprine 10 mg 3 times daily as needed.  Advised that this medication may make her sleepy so avoid taking it when she has to be up and about.  Referring to formal physical therapy.  If symptoms not improved within the next 6 weeks, return to our office to follow-up  with Dr. Darene Lamer for further evaluation management.  Work note provided and any necessary forms requested to be sent to our office. - DG Shoulder Right; Future - meloxicam (MOBIC) 15 MG tablet; Take 1 tablet (15 mg total) by mouth daily.  Dispense: 30 tablet; Refill: 0 - cyclobenzaprine (FLEXERIL) 10 MG tablet; Take 0.5-1 tablets (5-10 mg total) by mouth 3 (three) times daily as needed for muscle spasms. Caution: can cause drowsiness  Dispense: 60 tablet; Refill: 1 - Ambulatory referral to Physical Therapy  2. Encounter for screening mammogram for malignant neoplasm of breast Mammogram ordered. - MM Digital Diagnostic Bilat; Future  Return in about 6 weeks (around 02/09/2023) for right shoulder injury follow up.  ___________________________________________ Clearnce Sorrel, DNP, APRN, FNP-BC Primary [facility] and [place]

## 2023-01-13 NOTE — Therapy (Unsigned)
OUTPATIENT PHYSICAL THERAPY SHOULDER EVALUATION   Patient Name: Hailey Robinson MRN: [id number] DOB:[birth year], 62 y.o., female Today's Date: 01/14/2023  END OF SESSION:   Past Medical History:  Diagnosis Date   Anemia    Anemia 02/03/2017   Arthritis    "think I have some in my lower back" (02/10/2017)   Chronic bronchitis ([place]) 03/29/2019   Cognitive change 03/28/2019   Colitis 02/14/2019   Degenerative arthritis of cervical spine 03/29/2019   Elevated C-reactive protein (CRP) 03/29/2019   Encounter for smoking cessation counseling 05/26/2018   Facial paralysis/Bells palsy 07/05/2019   Fatigue 03/28/2019   Functional systolic murmur    normal ECHO 07/2016   GERD (gastroesophageal reflux disease)    spicy foods   Grade I internal hemorrhoids 04/13/2017   Repeat colonoscopy in 10 years   Hypertension    Iron deficiency 03/29/2019   Left thyroid nodule 11/04/2016   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   Leukopenia 05/26/2018   New daily persistent headache 05/05/2018   Perimenopause 11/04/2016   Pilonidal sinus 11/04/2016   Pneumonia 10/2016   Prediabetes 03/29/2019   Seasonal allergies    Subacute sphenoidal sinusitis 07/05/2019   Unilateral vocal cord paralysis    Weakness of left upper extremity 03/28/2019   Past Surgical History:  Procedure Laterality Date   BIOPSY THYROID  12/2016 X 2   LACERATION REPAIR Right 1985   pinky   THYROID LOBECTOMY Left 02/10/2017   THYROID LOBECTOMY Left 02/10/2017   Procedure: LEFT THYROID LOBECTOMY;  Surgeon: Ralene Ok, MD;  Location: [facility];  Service: General;  Laterality: Left;   TUBAL LIGATION     Patient Active Problem List   Diagnosis Date Noted   Migraine without aura and without status migrainosus, not intractable 03/30/2022   Strain of muscle, fascia and tendon of lower back, initial encounter 03/11/2021   Left breast mass 04/08/2020   Seasonal allergic rhinitis 07/06/2019   Superficial perivascular dermatitis 04/21/2019   Prediabetes 03/29/2019    Hypokalemia due to excessive renal loss of potassium 03/29/2019   Degenerative arthritis of cervical spine 03/29/2019   Chronic bronchitis ([place]) 03/29/2019   Cigarette nicotine dependence without complication 0000000   At risk for cardiovascular event 05/10/2018   Dyslipidemia, goal LDL below 70 05/06/2018   H/O Graves' disease 05/05/2018   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 She felt pain in the Rt shoulder at that time which has persisted. She seen in employee health and treated with medication with some improvement but pain continues.   PERTINENT HISTORY: HTN; thyroid removed; lumps in breast non malignant  PAIN:  Are you having pain? Yes: NPRS scale: 6/10 Pain location: Rt shoulder  Pain description: burning  Aggravating factors: using Rt arm too much; lyig Rt shoulder  Relieving factors: meds; topical analgesic   PRECAUTIONS: None  WEIGHT BEARING RESTRICTIONS: No  FALLS:  Has patient fallen in last 6 months? No  LIVING ENVIRONMENT: Lives with: lives with their spouse Lives in: House/apartment   OCCUPATION: [employer]   PLOF: Independent  PATIENT GOALS: get shoulder better so she can use the arm again    NEXT MD VISIT: 02/09/23  OBJECTIVE:   DIAGNOSTIC FINDINGS:  X-ray 12/29/22 Rt shoulder - negative  PATIENT SURVEYS:  FOTO 48; goal 64  COGNITION: Overall cognitive status: Within functional limits for tasks assessed     SENSATION: WFL  POSTURE: Patient presents with head forward posture with increased thoracic kyphosis; shoulders rounded and elevated; scapulae abducted and rotated along the thoracic spine; head of the humerus anterior in orientation.   UPPER EXTREMITY ROM:   Active ROM Right eval Left eval  Shoulder flexion 126 anterior shoulder pain  130  Shoulder extension 50 54  Shoulder abduction 101 pain 127  Shoulder adduction    Shoulder internal rotation Hand to waist pain Thumb T10  Shoulder external rotation 40 elbow at side 39 elbow at side   Elbow flexion    Elbow extension    Wrist flexion    Wrist extension    Wrist ulnar deviation    Wrist radial deviation    Wrist pronation    Wrist supination    (Blank rows = not tested)  UPPER EXTREMITY MMT: pain with resistive testing Rt UE  MMT Right eval Left eval  Shoulder flexion 4 5  Shoulder extension 4+ 5  Shoulder abduction 4- 5  Shoulder adduction    Shoulder internal rotation 4+ 5  Shoulder external rotation 4- 5  Middle trapezius    Lower trapezius    Elbow flexion    Elbow extension    Wrist flexion    Wrist extension    Wrist ulnar deviation    Wrist radial deviation    Wrist pronation    Wrist supination    Grip strength (lbs)    (Blank rows = not tested)  PALPATION:  Muscular tightness noted Rt > Lt pecs; anterior cervical and shoulder musculature; ant/middle/posterior cervical musculature; upper trap; leveator   [facility] Adult PT Treatment:                                                DATE: 01/14/23 Therapeutic Exercise: Chin tuck 5 sec x 5 w/ noodle Scap squeeze 10 sec x 5 w/ noodle L's x 10 w/ noodle W's x 10 w/ noodle Lateral cervical flexion 5 sec x 3 Rt/Lt  Doorway stretch  3 positions 30 sec x 2 reps  Manual Therapy:  Neuromuscular re-ed: Initiated work on posture and alignment  Modalities: Add as needed Self Care: Modification of recliner  or soft chair to avoid rounded posture   PATIENT EDUCATION: Education details: POC; HEP Person educated: Patient Education method: Explanation, Demonstration, Tactile cues, Verbal cues, and Handouts Education comprehension: verbalized understanding, returned demonstration, verbal cues required, tactile cues required, and needs further education  HOME EXERCISE PROGRAM: Access Code: [email or web address] URL: [email or web address] Date: 01/14/2023 Prepared by: Gillermo Murdoch  Exercises - Seated Cervical Retraction  - 2 x daily - 7 x weekly - 1-2 sets - 5-10 reps - 10 sec  hold - Supine Cervical Retraction with Towel  - 2 x daily - 7 x weekly - 1 sets - 5-10 reps - 10 sec  hold - Seated Scapular Retraction  - 2 x daily - 7 x weekly - 1-2 sets - 10 reps - 10 sec  hold - Supine Scapular Retraction  - 2 x daily - 7 x weekly - 1 sets - 10 reps - 5-10 sec  hold - Shoulder External Rotation in 45 Degrees Abduction  - 2 x daily - 7 x weekly - 1-2 sets - 10 reps - 3 sec  hold - Seated Cervical Sidebending AROM  - 2 x daily - 7 x weekly - 1 sets - 5 reps - 5-10 sec  hold - Doorway Pec Stretch at 60 Degrees Abduction  - 3 x daily - 7 x weekly - 1 sets - 3 reps - Doorway Pec Stretch at 90 Degrees Abduction  - 3 x daily - 7 x weekly - 1 sets - 3 reps - 30 seconds  hold - Doorway Pec Stretch at 120 Degrees Abduction  - 3 x daily - 7 x weekly - 1 sets - 3 reps - 30 second hold  hold  ASSESSMENT:  CLINICAL IMPRESSION: Patient is a 62 y.o. female who was seen today for physical therapy evaluation and treatment for Rt shoulder pain and dysfunction following injury while lifting a patient at work. She has been out of work since 12/16/22. Xrays 12/29/22 were negative. Patient presents with poor posture and alignment limited cervical and  shoulder ROM/mobility; Rt UE weakness; muscular tightness to palpation cervical and thoracic musculature Rt > Lt; decreased PA and lateral mobility thoracic spine; pain with functional activities; decreased ADL's and work task tolerance. Patient will benefit from PT to address problems identified.   OBJECTIVE IMPAIRMENTS: decreased activity tolerance, decreased mobility, decreased ROM, decreased strength, hypomobility, increased fascial restrictions, impaired perceived functional ability, increased muscle spasms, impaired flexibility, impaired UE functional use, improper body mechanics, postural dysfunction, and pain.   ACTIVITY LIMITATIONS: carrying, lifting, and reach over head  PARTICIPATION LIMITATIONS: meal prep, cleaning, laundry, and occupation  PERSONAL FACTORS: Age, Fitness, Past/current experiences, Profession, Time since onset of injury/illness/exacerbation, and comorbidities: including are also affecting patient's functional outcome.   REHAB POTENTIAL: Good  CLINICAL DECISION MAKING: Stable/uncomplicated  EVALUATION COMPLEXITY: Low   GOALS: Goals reviewed with patient? Yes  SHORT TERM GOALS: Target date: 02/11/2023   Independent in initial HEP  Baseline: Goal status: INITIAL  2.  Decrease pain Rt shoulder by 25-50% allowing patient to increase functional use Rt UE Baseline:  Goal status: INITIAL  LONG TERM GOALS: Target date: 03/10/2023   Improve posture and alignment with patient to demonstrate improved upright posture with posterior shoulder girdle engaged  Baseline:  Goal status: INITIAL  2.  AROM Rt shoulder =/> than AROM Lt shoulder  Baseline:  Goal status: INITIAL  3.  Pain free AROM Rt shoulder WFL's in all planes  Baseline:  Goal status: INITIAL  4.  5/5 strength Rt shoulder  Baseline:  Goal status: INITIAL  5.  Independent in HEP including aquatic program as indicated  Baseline:  Goal status: INITIAL  6.  Improve functional limitation score to  67 Baseline:  Goal status: INITIAL  PLAN:  PT FREQUENCY: 2x/week  PT DURATION: 8 weeks  PLANNED INTERVENTIONS: Therapeutic exercises, Therapeutic activity, Neuromuscular re-education, Patient/Family education, Self Care, Joint mobilization, Aquatic Therapy, Dry Needling, Electrical stimulation, Spinal mobilization, Cryotherapy, Moist heat, Taping, Ultrasound, Ionotophoresis 44m/ml Dexamethasone, Manual therapy, and Re-evaluation  PLAN FOR NEXT SESSION: review and progress exercises; continue with postural correction and education; manual work, DN, modalities as indicated    [employer] PT 01/14/2023, 1:29 PM

## 2023-01-14 ENCOUNTER — Encounter: Payer: Self-pay | Admitting: Rehabilitative and Restorative Service Providers"

## 2023-01-14 ENCOUNTER — Other Ambulatory Visit: Payer: Self-pay

## 2023-01-14 ENCOUNTER — Ambulatory Visit
Payer: Federal, State, Local not specified - PPO | Attending: Medical-Surgical | Admitting: Rehabilitative and Restorative Service Providers"

## 2023-01-14 DIAGNOSIS — R29898 Other symptoms and signs involving the musculoskeletal system: Secondary | ICD-10-CM | POA: Insufficient documentation

## 2023-01-14 DIAGNOSIS — R293 Abnormal posture: Secondary | ICD-10-CM | POA: Diagnosis not present

## 2023-01-14 DIAGNOSIS — M6281 Muscle weakness (generalized): Secondary | ICD-10-CM | POA: Insufficient documentation

## 2023-01-14 DIAGNOSIS — M25511 Pain in right shoulder: Secondary | ICD-10-CM | POA: Insufficient documentation

## 2023-01-14 DIAGNOSIS — S4991XD Unspecified injury of right shoulder and upper arm, subsequent encounter: Secondary | ICD-10-CM | POA: Diagnosis not present

## 2023-01-20 ENCOUNTER — Encounter: Payer: Self-pay | Admitting: Medical-Surgical

## 2023-01-22 ENCOUNTER — Telehealth: Payer: Self-pay

## 2023-01-22 NOTE — Telephone Encounter (Signed)
Patient came into office to drop off [employer] form, form placed in Joy's box, thanks.

## 2023-01-24 ENCOUNTER — Other Ambulatory Visit: Payer: Self-pay | Admitting: Medical-Surgical

## 2023-01-24 DIAGNOSIS — J302 Other seasonal allergic rhinitis: Secondary | ICD-10-CM

## 2023-02-06 ENCOUNTER — Other Ambulatory Visit: Payer: Self-pay | Admitting: Medical-Surgical

## 2023-02-09 ENCOUNTER — Encounter: Payer: Self-pay | Admitting: Medical-Surgical

## 2023-02-09 ENCOUNTER — Ambulatory Visit: Payer: Federal, State, Local not specified - PPO | Admitting: Medical-Surgical

## 2023-02-09 VITALS — BP 128/80 | HR 95 | Ht 66.0 in | Wt 197.4 lb

## 2023-02-09 DIAGNOSIS — S4991XD Unspecified injury of right shoulder and upper arm, subsequent encounter: Secondary | ICD-10-CM | POA: Diagnosis not present

## 2023-02-09 DIAGNOSIS — I10 Essential (primary) hypertension: Secondary | ICD-10-CM | POA: Diagnosis not present

## 2023-02-09 DIAGNOSIS — G4700 Insomnia, unspecified: Secondary | ICD-10-CM | POA: Insufficient documentation

## 2023-02-09 DIAGNOSIS — R7303 Prediabetes: Secondary | ICD-10-CM

## 2023-02-09 MED ORDER — METFORMIN HCL ER (OSM) 500 MG PO TB24: 500.0000 mg | ORAL_TABLET | Freq: Every day | ORAL | 1 refills | Status: DC

## 2023-02-09 MED ORDER — AMLODIPINE BESYLATE 5 MG PO TABS: ORAL_TABLET | ORAL | 1 refills | Status: DC

## 2023-02-09 MED ORDER — ZOLPIDEM TARTRATE 5 MG PO TABS: ORAL_TABLET | ORAL | 2 refills | Status: DC

## 2023-02-09 NOTE — Progress Notes (Signed)
Established Patient Office Visit  Subjective   Patient ID: Hailey Robinson, female   DOB: [birth year] Age: 62 y.o. MRN: [id number]   Chief Complaint  Patient presents with   Shoulder Injury    Patient in office for right shoulder injury follow up.    HPI Pleasant 61 year old female presenting today for the following:  Right shoulder: Had an injury to her right shoulder approximately 6 weeks ago and has been placed on light duty at work while she recovers.  Today she represents reporting that her shoulder pain has greatly improved and she feels like she is ready to go back to full duty soon.  Requesting a letter releasing her to full duty starting 02/15/2023.  Hypertension: Reports that she has not been taking amlodipine 5 mg daily for about 2 months since the pharmacy would not give her the medication.  She is not sure why and they did not give her a specific reason.  Prediabetes: was  previously prescribed metformin but was never able to get this medication from her pharmacy either.   Insomnia: taking Ambien 5mg  nightly, tolerating well without side effects. Feels the medication works well for her. Requesting a refill.    Objective:    Vitals:   02/09/23 1003  BP: 128/80  Pulse: 95  Height: 5\' 6"  (1.676 m)  Weight: 197 lb 7 oz (89.6 kg)  SpO2: 98%  BMI (Calculated): 31.88    Physical Exam Vitals reviewed.  Constitutional:      General: She is not in acute distress.    Appearance: Normal appearance. She is obese. She is not ill-appearing.  HENT:     Head: Normocephalic and atraumatic.  Cardiovascular:     Rate and Rhythm: Normal rate and regular rhythm.     Pulses: Normal pulses.     Heart sounds: Normal heart sounds.  Pulmonary:     Effort: Pulmonary effort is normal. No respiratory distress.     Breath sounds: Normal breath sounds. No wheezing, rhonchi or rales.  Skin:    General: Skin is warm and dry.  Neurological:     Mental Status: She is alert and oriented  to person, place, and time.  Psychiatric:        Mood and Affect: Mood normal.        Behavior: Behavior normal.        Thought Content: Thought content normal.        Judgment: Judgment normal.   No results found for this or any previous visit (from the past 24 hour(s)).     The ASCVD Risk score (Arnett DK, et al., 2019) failed to calculate for the following reasons:   The valid total cholesterol range is 130 to 320 mg/dL   Assessment & Plan:   1. Injury of right shoulder, subsequent encounter Reports this is much better. Letter to return to full duty provided via printed copy and MyChart.   2. Essential hypertension Resending Amlodipine 5mg  daily. Unclear why this wasn't provided as we received confirmation of receipt by the pharmacy. BP looks good today so advised that she watch her BP at home and if it starts to run higher than 130/80, resume the Amlodipine.  - amLODipine (NORVASC) 5 MG tablet; TAKE 1 TABLET(5 MG) BY MOUTH DAILY  Dispense: 90 tablet; Refill: 1  3. Prediabetes Resending Metformin. It looks like this required a PA that was submitted but there is no determination documented.   4. Insomnia, unspecified type Refilling Ambien.  Return in  about 6 months (around 08/12/2023), or if symptoms worsen or fail to improve.  ___________________________________________ Clearnce Sorrel, DNP, APRN, FNP-BC [facility]

## 2023-02-10 ENCOUNTER — Other Ambulatory Visit: Payer: Self-pay | Admitting: Medical-Surgical

## 2023-02-10 DIAGNOSIS — S4991XD Unspecified injury of right shoulder and upper arm, subsequent encounter: Secondary | ICD-10-CM

## 2023-02-23 ENCOUNTER — Ambulatory Visit: Payer: Federal, State, Local not specified - PPO | Admitting: Medical-Surgical

## 2023-03-01 ENCOUNTER — Other Ambulatory Visit: Payer: Self-pay | Admitting: Medical-Surgical

## 2023-03-01 DIAGNOSIS — G479 Sleep disorder, unspecified: Secondary | ICD-10-CM

## 2023-03-18 ENCOUNTER — Other Ambulatory Visit: Payer: Self-pay | Admitting: Medical-Surgical

## 2023-03-18 DIAGNOSIS — S4991XD Unspecified injury of right shoulder and upper arm, subsequent encounter: Secondary | ICD-10-CM

## 2023-03-31 ENCOUNTER — Other Ambulatory Visit: Payer: Self-pay | Admitting: Medical-Surgical

## 2023-03-31 DIAGNOSIS — J302 Other seasonal allergic rhinitis: Secondary | ICD-10-CM

## 2023-04-20 ENCOUNTER — Encounter: Payer: Self-pay | Admitting: Medical-Surgical

## 2023-04-20 ENCOUNTER — Other Ambulatory Visit: Payer: Self-pay | Admitting: Medical-Surgical

## 2023-04-20 DIAGNOSIS — S4991XD Unspecified injury of right shoulder and upper arm, subsequent encounter: Secondary | ICD-10-CM

## 2023-04-20 DIAGNOSIS — E876 Hypokalemia: Secondary | ICD-10-CM

## 2023-04-21 ENCOUNTER — Other Ambulatory Visit: Payer: Self-pay | Admitting: Medical-Surgical

## 2023-04-27 ENCOUNTER — Telehealth: Payer: Self-pay

## 2023-04-27 NOTE — Telephone Encounter (Signed)
Spoke with patient, informed that FMLA paperwork to care for spouse is ready for pick up, informed of $29 fee, patient states that she will be in Thursday to pick up and pay fee, forms placed on black accordion on wall beside Anita's desk, thanks.

## 2023-05-13 ENCOUNTER — Telehealth: Payer: Self-pay | Admitting: Medical-Surgical

## 2023-05-13 NOTE — Telephone Encounter (Signed)
Placed in providers form folder

## 2023-05-13 NOTE — Telephone Encounter (Signed)
Pt dropped off form this afternoon. She stated that  it was not complete. Some of the boxes were left unchecked. Form has to be turned in by 07/03.

## 2023-05-17 NOTE — Telephone Encounter (Signed)
Faxed forms to [phone]  ATTN: Lennox Solders  Made a copy for scan

## 2023-05-19 ENCOUNTER — Encounter: Payer: Self-pay | Admitting: Medical-Surgical

## 2023-05-19 NOTE — Telephone Encounter (Signed)
Synetta Fail contacted the patient and advised her that the forms were faxed 06/24/204.

## 2023-06-08 ENCOUNTER — Encounter: Payer: Self-pay | Admitting: Medical-Surgical

## 2023-06-08 NOTE — Telephone Encounter (Signed)
Patient scheduled.

## 2023-06-08 NOTE — Progress Notes (Signed)
   Complete physical exam  Patient: Hailey Robinson   DOB: 09/12/1998   62 y.o. Female  MRN: [id number]  Subjective:    No chief complaint on file.   Hailey Robinson is a 62 y.o. female who presents today for a complete physical exam. She reports consuming a {diet types:17450} diet. {types:19826} She generally feels {DESC; WELL/FAIRLY WELL/POORLY:18703}. She reports sleeping {DESC; WELL/FAIRLY WELL/POORLY:18703}. She {does/does not:200015} have additional problems to discuss today.    Most recent fall risk assessment:    05/20/2022   10:42 AM  Fall Risk   Falls in the past year? 0  Number falls in past yr: 0  Injury with Fall? 0  Risk for fall due to : No Fall Risks  Follow up Falls evaluation completed     Most recent depression screenings:    05/20/2022   10:42 AM 04/10/2021   10:46 AM  PHQ 2/9 Scores  PHQ - 2 Score 0 0  PHQ- 9 Score 5     {VISON DENTAL STD PSA (Optional):27386}  {History (Optional):23778}  Patient Care Team: Jessup, Joy, NP as PCP - General (Nurse Practitioner)   Outpatient Medications Prior to Visit  Medication Sig   fluticasone (FLONASE) 50 MCG/ACT nasal spray Place 2 sprays into both nostrils in the morning and at bedtime. After 7 days, reduce to once daily.   norgestimate-ethinyl estradiol (SPRINTEC 28) 0.25-35 MG-MCG tablet Take 1 tablet by mouth daily.   Nystatin POWD Apply liberally to affected area 2 times per day   spironolactone (ALDACTONE) 100 MG tablet Take 1 tablet (100 mg total) by mouth daily.   No facility-administered medications prior to visit.    ROS        Objective:     There were no vitals taken for this visit. {Vitals History (Optional):23777}  Physical Exam   No results found for any visits on 06/25/22. {Show previous labs (optional):23779}    Assessment & Plan:    Routine Health Maintenance and Physical Exam  Immunization History  Administered Date(s) Administered   DTaP 11/26/1999, 01/22/2000,  04/01/2000, 12/16/2000, 07/01/2004   Hepatitis A 04/27/2008, 05/03/2009   Hepatitis B 09/13/1999, 10/21/1999, 04/01/2000   HiB (PRP-OMP) 11/26/1999, 01/22/2000, 04/01/2000, 12/16/2000   IPV 11/26/1999, 01/22/2000, 09/20/2000, 07/01/2004   Influenza,inj,Quad PF,6+ Mos 08/03/2014   Influenza-Unspecified 11/02/2012   MMR 09/20/2001, 07/01/2004   Meningococcal Polysaccharide 05/02/2012   Pneumococcal Conjugate-13 12/16/2000   Pneumococcal-Unspecified 04/01/2000, 06/15/2000   Tdap 05/02/2012   Varicella 09/20/2000, 04/27/2008    Health Maintenance  Topic Date Due   HIV Screening  Never done   Hepatitis C Screening  Never done   INFLUENZA VACCINE  06/23/2022   PAP-Cervical Cytology Screening  06/25/2022 (Originally 09/11/2020)   PAP SMEAR-Modifier  06/25/2022 (Originally 09/11/2020)   TETANUS/TDAP  06/25/2022 (Originally 05/02/2022)   HPV VACCINES  Discontinued   COVID-19 Vaccine  Discontinued    Discussed health benefits of physical activity, and encouraged her to engage in regular exercise appropriate for her age and condition.  Problem List Items Addressed This Visit   None Visit Diagnoses     Annual physical exam    -  Primary   Cervical cancer screening       Need for Tdap vaccination          No follow-ups on file.     Joy Jessup, NP   

## 2023-06-09 ENCOUNTER — Ambulatory Visit (INDEPENDENT_AMBULATORY_CARE_PROVIDER_SITE_OTHER): Payer: Federal, State, Local not specified - PPO | Admitting: Medical-Surgical

## 2023-06-09 DIAGNOSIS — Z91199 Patient's noncompliance with other medical treatment and regimen due to unspecified reason: Secondary | ICD-10-CM

## 2023-06-09 DIAGNOSIS — R42 Dizziness and giddiness: Secondary | ICD-10-CM

## 2023-06-17 ENCOUNTER — Other Ambulatory Visit: Payer: Self-pay

## 2023-06-17 DIAGNOSIS — J302 Other seasonal allergic rhinitis: Secondary | ICD-10-CM

## 2023-06-17 MED ORDER — ALBUTEROL SULFATE HFA 108 (90 BASE) MCG/ACT IN AERS
2.0000 | INHALATION_SPRAY | RESPIRATORY_TRACT | 1 refills | Status: DC | PRN
Start: 2023-06-17 — End: 2024-09-26

## 2023-06-23 ENCOUNTER — Other Ambulatory Visit: Payer: Self-pay

## 2023-06-23 DIAGNOSIS — J302 Other seasonal allergic rhinitis: Secondary | ICD-10-CM

## 2023-06-23 MED ORDER — FLUTICASONE PROPIONATE 50 MCG/ACT NA SUSP
1.0000 | Freq: Every day | NASAL | 0 refills | Status: AC
Start: 2023-06-23 — End: ?

## 2023-06-28 DIAGNOSIS — R9082 White matter disease, unspecified: Secondary | ICD-10-CM | POA: Diagnosis not present

## 2023-06-28 DIAGNOSIS — R42 Dizziness and giddiness: Secondary | ICD-10-CM | POA: Diagnosis not present

## 2023-06-28 DIAGNOSIS — I10 Essential (primary) hypertension: Secondary | ICD-10-CM | POA: Diagnosis not present

## 2023-06-28 DIAGNOSIS — Z7982 Long term (current) use of aspirin: Secondary | ICD-10-CM | POA: Diagnosis not present

## 2023-06-28 DIAGNOSIS — F17210 Nicotine dependence, cigarettes, uncomplicated: Secondary | ICD-10-CM | POA: Diagnosis not present

## 2023-06-28 DIAGNOSIS — Z885 Allergy status to narcotic agent status: Secondary | ICD-10-CM | POA: Diagnosis not present

## 2023-06-28 DIAGNOSIS — Z79899 Other long term (current) drug therapy: Secondary | ICD-10-CM | POA: Diagnosis not present

## 2023-06-28 DIAGNOSIS — Z5321 Procedure and treatment not carried out due to patient leaving prior to being seen by health care provider: Secondary | ICD-10-CM | POA: Diagnosis not present

## 2023-07-05 ENCOUNTER — Encounter: Payer: Self-pay | Admitting: Medical-Surgical

## 2023-07-07 ENCOUNTER — Encounter: Payer: Self-pay | Admitting: Medical-Surgical

## 2023-08-12 ENCOUNTER — Ambulatory Visit: Payer: Federal, State, Local not specified - PPO | Admitting: Medical-Surgical

## 2023-08-23 ENCOUNTER — Ambulatory Visit: Payer: Federal, State, Local not specified - PPO | Admitting: Medical-Surgical

## 2023-08-25 ENCOUNTER — Other Ambulatory Visit: Payer: Self-pay | Admitting: Medical-Surgical

## 2023-08-25 DIAGNOSIS — J302 Other seasonal allergic rhinitis: Secondary | ICD-10-CM

## 2023-08-25 DIAGNOSIS — S4991XD Unspecified injury of right shoulder and upper arm, subsequent encounter: Secondary | ICD-10-CM

## 2023-08-26 MED ORDER — MELOXICAM 15 MG PO TABS
15.0000 mg | ORAL_TABLET | Freq: Every day | ORAL | 1 refills | Status: DC
Start: 2023-08-26 — End: 2023-09-17

## 2023-08-26 MED ORDER — FLUTICASONE PROPIONATE 50 MCG/ACT NA SUSP: 1.0000 | Freq: Every day | NASAL | 11 refills | Status: DC

## 2023-08-27 ENCOUNTER — Encounter: Payer: Federal, State, Local not specified - PPO | Admitting: Medical-Surgical

## 2023-09-01 ENCOUNTER — Other Ambulatory Visit: Payer: Self-pay

## 2023-09-01 DIAGNOSIS — G479 Sleep disorder, unspecified: Secondary | ICD-10-CM

## 2023-09-01 MED ORDER — DOXEPIN HCL 25 MG PO CAPS
ORAL_CAPSULE | ORAL | 0 refills | Status: DC
Start: 2023-09-01 — End: 2023-09-17

## 2023-09-01 MED ORDER — CHLORTHALIDONE 25 MG PO TABS: 25.0000 mg | ORAL_TABLET | Freq: Every day | ORAL | 0 refills | Status: DC

## 2023-09-10 ENCOUNTER — Ambulatory Visit (INDEPENDENT_AMBULATORY_CARE_PROVIDER_SITE_OTHER): Payer: Federal, State, Local not specified - PPO | Admitting: Medical-Surgical

## 2023-09-10 DIAGNOSIS — Z91199 Patient's noncompliance with other medical treatment and regimen due to unspecified reason: Secondary | ICD-10-CM

## 2023-09-10 NOTE — Progress Notes (Unsigned)
   Complete physical exam  Patient: Hailey Robinson   DOB: 09/12/1998   62 y.o. Female  MRN: [id number]  Subjective:    No chief complaint on file.   Hailey Robinson is a 62 y.o. female who presents today for a complete physical exam. She reports consuming a {diet types:17450} diet. {types:19826} She generally feels {DESC; WELL/FAIRLY WELL/POORLY:18703}. She reports sleeping {DESC; WELL/FAIRLY WELL/POORLY:18703}. She {does/does not:200015} have additional problems to discuss today.    Most recent fall risk assessment:    05/20/2022   10:42 AM  Fall Risk   Falls in the past year? 0  Number falls in past yr: 0  Injury with Fall? 0  Risk for fall due to : No Fall Risks  Follow up Falls evaluation completed     Most recent depression screenings:    05/20/2022   10:42 AM 04/10/2021   10:46 AM  PHQ 2/9 Scores  PHQ - 2 Score 0 0  PHQ- 9 Score 5     {VISON DENTAL STD PSA (Optional):27386}  {History (Optional):23778}  Patient Care Team: Tilford Deaton, NP as PCP - General (Nurse Practitioner)   Outpatient Medications Prior to Visit  Medication Sig   fluticasone (FLONASE) 50 MCG/ACT nasal spray Place 2 sprays into both nostrils in the morning and at bedtime. After 7 days, reduce to once daily.   norgestimate-ethinyl estradiol (SPRINTEC 28) 0.25-35 MG-MCG tablet Take 1 tablet by mouth daily.   Nystatin POWD Apply liberally to affected area 2 times per day   spironolactone (ALDACTONE) 100 MG tablet Take 1 tablet (100 mg total) by mouth daily.   No facility-administered medications prior to visit.    ROS        Objective:     There were no vitals taken for this visit. {Vitals History (Optional):23777}  Physical Exam   No results found for any visits on 06/25/22. {Show previous labs (optional):23779}    Assessment & Plan:    Routine Health Maintenance and Physical Exam  Immunization History  Administered Date(s) Administered   DTaP 11/26/1999, 01/22/2000,  04/01/2000, 12/16/2000, 07/01/2004   Hepatitis A 04/27/2008, 05/03/2009   Hepatitis B 09/13/1999, 10/21/1999, 04/01/2000   HiB (PRP-OMP) 11/26/1999, 01/22/2000, 04/01/2000, 12/16/2000   IPV 11/26/1999, 01/22/2000, 09/20/2000, 07/01/2004   Influenza,inj,Quad PF,6+ Mos 08/03/2014   Influenza-Unspecified 11/02/2012   MMR 09/20/2001, 07/01/2004   Meningococcal Polysaccharide 05/02/2012   Pneumococcal Conjugate-13 12/16/2000   Pneumococcal-Unspecified 04/01/2000, 06/15/2000   Tdap 05/02/2012   Varicella 09/20/2000, 04/27/2008    Health Maintenance  Topic Date Due   HIV Screening  Never done   Hepatitis C Screening  Never done   INFLUENZA VACCINE  06/23/2022   PAP-Cervical Cytology Screening  06/25/2022 (Originally 09/11/2020)   PAP SMEAR-Modifier  06/25/2022 (Originally 09/11/2020)   TETANUS/TDAP  06/25/2022 (Originally 05/02/2022)   HPV VACCINES  Discontinued   COVID-19 Vaccine  Discontinued    Discussed health benefits of physical activity, and encouraged her to engage in regular exercise appropriate for her age and condition.  Problem List Items Addressed This Visit   None Visit Diagnoses     Annual physical exam    -  Primary   Cervical cancer screening       Need for Tdap vaccination          No follow-ups on file.     Tahjae Clausing, NP   

## 2023-09-10 NOTE — Patient Instructions (Signed)
Preventive Care 40-61 Years Old, Female Preventive care refers to lifestyle choices and visits with your health care provider that can promote health and wellness. Preventive care visits are also called wellness exams. What can I expect for my preventive care visit? Counseling Your health care provider may ask you questions about your: Medical history, including: Past medical problems. Family medical history. Pregnancy history. Current health, including: Menstrual cycle. Method of birth control. Emotional well-being. Home life and relationship well-being. Sexual activity and sexual health. Lifestyle, including: Alcohol, nicotine or tobacco, and drug use. Access to firearms. Diet, exercise, and sleep habits. Work and work environment. Sunscreen use. Safety issues such as seatbelt and bike helmet use. Physical exam Your health care provider will check your: Height and weight. These may be used to calculate your BMI (body mass index). BMI is a measurement that tells if you are at a healthy weight. Waist circumference. This measures the distance around your waistline. This measurement also tells if you are at a healthy weight and may help predict your risk of certain diseases, such as type 2 diabetes and high blood pressure. Heart rate and blood pressure. Body temperature. Skin for abnormal spots. What immunizations do I need?  Vaccines are usually given at various ages, according to a schedule. Your health care provider will recommend vaccines for you based on your age, medical history, and lifestyle or other factors, such as travel or where you work. What tests do I need? Screening Your health care provider may recommend screening tests for certain conditions. This may include: Lipid and cholesterol levels. Diabetes screening. This is done by checking your blood sugar (glucose) after you have not eaten for a while (fasting). Pelvic exam and Pap test. Hepatitis B test. Hepatitis C  test. HIV (human immunodeficiency virus) test. STI (sexually transmitted infection) testing, if you are at risk. Lung cancer screening. Colorectal cancer screening. Mammogram. Talk with your health care provider about when you should start having regular mammograms. This may depend on whether you have a family history of breast cancer. BRCA-related cancer screening. This may be done if you have a family history of breast, ovarian, tubal, or peritoneal cancers. Bone density scan. This is done to screen for osteoporosis. Talk with your health care provider about your test results, treatment options, and if necessary, the need for more tests. Follow these instructions at home: Eating and drinking  Eat a diet that includes fresh fruits and vegetables, whole grains, lean protein, and low-fat dairy products. Take vitamin and mineral supplements as recommended by your health care provider. Do not drink alcohol if: Your health care provider tells you not to drink. You are pregnant, may be pregnant, or are planning to become pregnant. If you drink alcohol: Limit how much you have to 0-1 drink a day. Know how much alcohol is in your drink. In the [place] one drink equals one 12 oz bottle of beer (355 mL), one 5 oz glass of wine (148 mL), or one 1 oz glass of hard liquor (44 mL). Lifestyle Brush your teeth every morning and night with fluoride toothpaste. Floss one time each day. Exercise for at least 30 minutes 5 or more days each week. Do not use any products that contain nicotine or tobacco. These products include cigarettes, chewing tobacco, and vaping devices, such as e-cigarettes. If you need help quitting, ask your health care provider. Do not use drugs. If you are sexually active, practice safe sex. Use a condom or other form of protection to   prevent STIs. If you do not wish to become pregnant, use a form of birth control. If you plan to become pregnant, see your health care provider for a  prepregnancy visit. Take aspirin only as told by your health care provider. Make sure that you understand how much to take and what form to take. Work with your health care provider to find out whether it is safe and beneficial for you to take aspirin daily. Find healthy ways to manage stress, such as: Meditation, yoga, or listening to music. Journaling. Talking to a trusted person. Spending time with friends and family. Minimize exposure to UV radiation to reduce your risk of skin cancer. Safety Always wear your seat belt while driving or riding in a vehicle. Do not drive: If you have been drinking alcohol. Do not ride with someone who has been drinking. When you are tired or distracted. While texting. If you have been using any mind-altering substances or drugs. Wear a helmet and other protective equipment during sports activities. If you have firearms in your house, make sure you follow all gun safety procedures. Seek help if you have been physically or sexually abused. What's next? Visit your health care provider once a year for an annual wellness visit. Ask your health care provider how often you should have your eyes and teeth checked. Stay up to date on all vaccines. This information is not intended to replace advice given to you by your health care provider. Make sure you discuss any questions you have with your health care provider. Document Revised: 05/07/2021 Document Reviewed: 05/07/2021 Elsevier Patient Education  2024 Elsevier Inc.  

## 2023-09-17 ENCOUNTER — Ambulatory Visit (INDEPENDENT_AMBULATORY_CARE_PROVIDER_SITE_OTHER): Payer: Federal, State, Local not specified - PPO | Admitting: Medical-Surgical

## 2023-09-17 ENCOUNTER — Encounter: Payer: Self-pay | Admitting: Medical-Surgical

## 2023-09-17 ENCOUNTER — Other Ambulatory Visit (HOSPITAL_COMMUNITY)
Admission: RE | Admit: 2023-09-17 | Discharge: 2023-09-17 | Disposition: A | Discharge status: Home / Self Care | Payer: Federal, State, Local not specified - PPO | Source: Ambulatory Visit | Attending: Medical-Surgical | Admitting: Medical-Surgical

## 2023-09-17 VITALS — BP 124/78 | HR 82 | Resp 20 | Ht 66.0 in | Wt 195.0 lb

## 2023-09-17 DIAGNOSIS — Z0000 Encounter for general adult medical examination without abnormal findings: Secondary | ICD-10-CM

## 2023-09-17 DIAGNOSIS — Z124 Encounter for screening for malignant neoplasm of cervix: Secondary | ICD-10-CM

## 2023-09-17 DIAGNOSIS — E890 Postprocedural hypothyroidism: Secondary | ICD-10-CM

## 2023-09-17 DIAGNOSIS — E876 Hypokalemia: Secondary | ICD-10-CM

## 2023-09-17 DIAGNOSIS — E785 Hyperlipidemia, unspecified: Secondary | ICD-10-CM

## 2023-09-17 DIAGNOSIS — G479 Sleep disorder, unspecified: Secondary | ICD-10-CM

## 2023-09-17 DIAGNOSIS — I10 Essential (primary) hypertension: Secondary | ICD-10-CM | POA: Diagnosis not present

## 2023-09-17 DIAGNOSIS — Z1231 Encounter for screening mammogram for malignant neoplasm of breast: Secondary | ICD-10-CM

## 2023-09-17 DIAGNOSIS — G43009 Migraine without aura, not intractable, without status migrainosus: Secondary | ICD-10-CM

## 2023-09-17 DIAGNOSIS — E119 Type 2 diabetes mellitus without complications: Secondary | ICD-10-CM | POA: Diagnosis not present

## 2023-09-17 DIAGNOSIS — R7303 Prediabetes: Secondary | ICD-10-CM

## 2023-09-17 DIAGNOSIS — F419 Anxiety disorder, unspecified: Secondary | ICD-10-CM | POA: Insufficient documentation

## 2023-09-17 DIAGNOSIS — S4991XD Unspecified injury of right shoulder and upper arm, subsequent encounter: Secondary | ICD-10-CM

## 2023-09-17 LAB — POCT GLYCOSYLATED HEMOGLOBIN (HGB A1C)
HbA1c, POC (controlled diabetic range): 6.6 % (ref 0.0–7.0)
Hemoglobin A1C: 6.6 % — AB (ref 4.0–5.6)

## 2023-09-17 MED ORDER — CHLORTHALIDONE 25 MG PO TABS: 25.0000 mg | ORAL_TABLET | Freq: Every day | ORAL | 3 refills | Status: DC

## 2023-09-17 MED ORDER — SERTRALINE HCL 50 MG PO TABS: ORAL_TABLET | ORAL | 3 refills | Status: DC

## 2023-09-17 MED ORDER — ATORVASTATIN CALCIUM 20 MG PO TABS: 20.0000 mg | ORAL_TABLET | Freq: Every day | ORAL | 3 refills | Status: DC

## 2023-09-17 MED ORDER — CYCLOBENZAPRINE HCL 10 MG PO TABS: 10.0000 mg | ORAL_TABLET | Freq: Three times a day (TID) | ORAL | 5 refills | Status: DC | PRN

## 2023-09-17 MED ORDER — SUMATRIPTAN SUCCINATE 50 MG PO TABS: 50.0000 mg | ORAL_TABLET | ORAL | 0 refills | Status: DC | PRN

## 2023-09-17 MED ORDER — POTASSIUM CHLORIDE CRYS ER 20 MEQ PO TBCR: 20.0000 meq | EXTENDED_RELEASE_TABLET | Freq: Every day | ORAL | 3 refills | Status: DC

## 2023-09-17 MED ORDER — AMLODIPINE BESYLATE 5 MG PO TABS
ORAL_TABLET | ORAL | 3 refills | Status: DC
Start: 2023-09-17 — End: 2024-09-26

## 2023-09-17 MED ORDER — METFORMIN HCL ER (OSM) 500 MG PO TB24: 500.0000 mg | ORAL_TABLET | Freq: Every day | ORAL | 1 refills | Status: DC

## 2023-09-17 MED ORDER — ZOLPIDEM TARTRATE 5 MG PO TABS: ORAL_TABLET | ORAL | 1 refills | Status: DC

## 2023-09-17 MED ORDER — DOXEPIN HCL 25 MG PO CAPS: ORAL_CAPSULE | ORAL | 3 refills | Status: DC

## 2023-09-17 MED ORDER — MELOXICAM 15 MG PO TABS: 15.0000 mg | ORAL_TABLET | Freq: Every day | ORAL | 1 refills | Status: DC

## 2023-09-17 NOTE — Patient Instructions (Signed)
Preventive Care 40-61 Years Old, Female Preventive care refers to lifestyle choices and visits with your health care provider that can promote health and wellness. Preventive care visits are also called wellness exams. What can I expect for my preventive care visit? Counseling Your health care provider may ask you questions about your: Medical history, including: Past medical problems. Family medical history. Pregnancy history. Current health, including: Menstrual cycle. Method of birth control. Emotional well-being. Home life and relationship well-being. Sexual activity and sexual health. Lifestyle, including: Alcohol, nicotine or tobacco, and drug use. Access to firearms. Diet, exercise, and sleep habits. Work and work environment. Sunscreen use. Safety issues such as seatbelt and bike helmet use. Physical exam Your health care provider will check your: Height and weight. These may be used to calculate your BMI (body mass index). BMI is a measurement that tells if you are at a healthy weight. Waist circumference. This measures the distance around your waistline. This measurement also tells if you are at a healthy weight and may help predict your risk of certain diseases, such as type 2 diabetes and high blood pressure. Heart rate and blood pressure. Body temperature. Skin for abnormal spots. What immunizations do I need?  Vaccines are usually given at various ages, according to a schedule. Your health care provider will recommend vaccines for you based on your age, medical history, and lifestyle or other factors, such as travel or where you work. What tests do I need? Screening Your health care provider may recommend screening tests for certain conditions. This may include: Lipid and cholesterol levels. Diabetes screening. This is done by checking your blood sugar (glucose) after you have not eaten for a while (fasting). Pelvic exam and Pap test. Hepatitis B test. Hepatitis C  test. HIV (human immunodeficiency virus) test. STI (sexually transmitted infection) testing, if you are at risk. Lung cancer screening. Colorectal cancer screening. Mammogram. Talk with your health care provider about when you should start having regular mammograms. This may depend on whether you have a family history of breast cancer. BRCA-related cancer screening. This may be done if you have a family history of breast, ovarian, tubal, or peritoneal cancers. Bone density scan. This is done to screen for osteoporosis. Talk with your health care provider about your test results, treatment options, and if necessary, the need for more tests. Follow these instructions at home: Eating and drinking  Eat a diet that includes fresh fruits and vegetables, whole grains, lean protein, and low-fat dairy products. Take vitamin and mineral supplements as recommended by your health care provider. Do not drink alcohol if: Your health care provider tells you not to drink. You are pregnant, may be pregnant, or are planning to become pregnant. If you drink alcohol: Limit how much you have to 0-1 drink a day. Know how much alcohol is in your drink. In the [place] one drink equals one 12 oz bottle of beer (355 mL), one 5 oz glass of wine (148 mL), or one 1 oz glass of hard liquor (44 mL). Lifestyle Brush your teeth every morning and night with fluoride toothpaste. Floss one time each day. Exercise for at least 30 minutes 5 or more days each week. Do not use any products that contain nicotine or tobacco. These products include cigarettes, chewing tobacco, and vaping devices, such as e-cigarettes. If you need help quitting, ask your health care provider. Do not use drugs. If you are sexually active, practice safe sex. Use a condom or other form of protection to   prevent STIs. If you do not wish to become pregnant, use a form of birth control. If you plan to become pregnant, see your health care provider for a  prepregnancy visit. Take aspirin only as told by your health care provider. Make sure that you understand how much to take and what form to take. Work with your health care provider to find out whether it is safe and beneficial for you to take aspirin daily. Find healthy ways to manage stress, such as: Meditation, yoga, or listening to music. Journaling. Talking to a trusted person. Spending time with friends and family. Minimize exposure to UV radiation to reduce your risk of skin cancer. Safety Always wear your seat belt while driving or riding in a vehicle. Do not drive: If you have been drinking alcohol. Do not ride with someone who has been drinking. When you are tired or distracted. While texting. If you have been using any mind-altering substances or drugs. Wear a helmet and other protective equipment during sports activities. If you have firearms in your house, make sure you follow all gun safety procedures. Seek help if you have been physically or sexually abused. What's next? Visit your health care provider once a year for an annual wellness visit. Ask your health care provider how often you should have your eyes and teeth checked. Stay up to date on all vaccines. This information is not intended to replace advice given to you by your health care provider. Make sure you discuss any questions you have with your health care provider. Document Revised: 05/07/2021 Document Reviewed: 05/07/2021 Elsevier Patient Education  2024 Elsevier Inc.  

## 2023-09-17 NOTE — Progress Notes (Signed)
Complete physical exam  Patient: Hailey Robinson   DOB: 01/08/1961   62 y.o. Female  MRN: [id number]  Subjective:    Chief Complaint  Patient presents with   Annual Exam   Azzie Hunsaker is a 62 y.o. female who presents today for a complete physical exam. She reports consuming a general diet.  Walking for exercise.  She generally feels fairly well. She reports sleeping well. She does not have additional problems to discuss today.    Most recent fall risk assessment:    02/09/2023   10:05 AM  Fall Risk   Falls in the past year? 0  Number falls in past yr: 0  Injury with Fall? 0  Risk for fall due to : No Fall Risks  Follow up Falls evaluation completed     Most recent depression screenings:    09/17/2023    9:25 AM 02/09/2023   10:06 AM  PHQ 2/9 Scores  PHQ - 2 Score 0 0  PHQ- 9 Score 3     Vision:Within last year, Dental: No current dental problems and Receives regular dental care, and STD: The patient denies history of sexually transmitted disease.    Patient Care Team: Christen Butter, NP as PCP - General (Nurse Practitioner) Carlus Pavlov, MD as Consulting Physician (Endocrinology) Barnie Alderman, MD as Consulting Physician (Otolaryngology) Annia Belt, MD as Consulting Physician (Neurology)   Outpatient Medications Prior to Visit  Medication Sig   albuterol (VENTOLIN HFA) 108 (90 Base) MCG/ACT inhaler Inhale 2 puffs into the lungs every 4 (four) hours as needed for wheezing or shortness of breath.   aspirin EC 81 MG tablet Take 1 tablet (81 mg total) by mouth daily.   fexofenadine (ALLEGRA) 180 MG tablet Take 180 mg by mouth daily.   fluticasone (FLONASE) 50 MCG/ACT nasal spray Place 1 spray into both nostrils daily.   methocarbamol (ROBAXIN) 500 MG tablet Take 1 tablet (500 mg total) by mouth 3 (three) times daily.   [DISCONTINUED] amLODipine (NORVASC) 5 MG tablet TAKE 1 TABLET(5 MG) BY MOUTH DAILY   [DISCONTINUED] atorvastatin (LIPITOR) 20  MG tablet Take 1 tablet (20 mg total) by mouth daily.   [DISCONTINUED] chlorthalidone (HYGROTON) 25 MG tablet Take 1 tablet (25 mg total) by mouth daily. TAKE 1 TABLET(25 MG) BY MOUTH DAILY   [DISCONTINUED] cyclobenzaprine (FLEXERIL) 10 MG tablet TAKE 1/2 TO 1 TABLET BY MOUTH THREE TIMES DAILY AS NEEDED FOR MUSCLE SPASMS. MAY CAUSE DROWSINESS   [DISCONTINUED] doxepin (SINEQUAN) 25 MG capsule TAKE 1 CAPSULE BY MOUTH AT BEDTIME AS NEEDED FOR SLEEP OR ANXIETY   [DISCONTINUED] meloxicam (MOBIC) 15 MG tablet Take 1 tablet (15 mg total) by mouth daily.   [DISCONTINUED] metformin (FORTAMET) 500 MG (OSM) 24 hr tablet Take 1 tablet (500 mg total) by mouth daily with breakfast.   [DISCONTINUED] potassium chloride SA (KLOR-CON M) 20 MEQ tablet TAKE 1 TABLET BY MOUTH DAILY   [DISCONTINUED] SUMAtriptan (IMITREX) 50 MG tablet Take 1 tablet (50 mg total) by mouth every 2 (two) hours as needed for migraine. May repeat in 2 hours if headache persists or recurs. Maximum daily dose: 2 tablets (100mg ).   [DISCONTINUED] zolpidem (AMBIEN) 5 MG tablet TAKE 1 TABLET(5 MG) BY MOUTH AT BEDTIME AS NEEDED FOR SLEEP   No facility-administered medications prior to visit.    Review of Systems  Constitutional:  Negative for chills, fever, malaise/fatigue and weight loss.  HENT:  Negative for congestion, ear pain, hearing loss, sinus pain and sore throat.  Eyes:  Negative for blurred vision, photophobia and pain.  Respiratory:  Negative for cough, shortness of breath and wheezing.   Cardiovascular:  Negative for chest pain, palpitations and leg swelling.  Gastrointestinal:  Negative for abdominal pain, blood in stool, constipation, diarrhea, heartburn, melena, nausea and vomiting.  Genitourinary:  Negative for dysuria, frequency, hematuria and urgency.  Musculoskeletal:  Negative for falls and neck pain.  Skin:  Negative for itching and rash.  Neurological:  Negative for dizziness, weakness and headaches.   Endo/Heme/Allergies:  Negative for polydipsia. Does not bruise/bleed easily.  Psychiatric/Behavioral:  Negative for depression, substance abuse and suicidal ideas. The patient is nervous/anxious. The patient does not have insomnia.      Objective:     BP 124/78 (BP Location: Left Arm, Cuff Size: Normal)   Pulse 82   Resp 20   Ht 5\' 6"  (1.676 m)   Wt 195 lb (88.5 kg)   LMP 01/24/2018 (Approximate)   SpO2 96%   BMI 31.47 kg/m    Physical Exam Vitals reviewed. Exam conducted with a chaperone present.  Constitutional:      General: She is not in acute distress.    Appearance: Normal appearance. She is not ill-appearing.  HENT:     Head: Normocephalic and atraumatic.     Right Ear: Tympanic membrane, ear canal and external ear normal. There is no impacted cerumen.     Left Ear: Tympanic membrane, ear canal and external ear normal. There is no impacted cerumen.     Nose: Nose normal. No congestion or rhinorrhea.     Mouth/Throat:     Mouth: Mucous membranes are moist.     Pharynx: No oropharyngeal exudate or posterior oropharyngeal erythema.  Eyes:     General: No scleral icterus.       Right eye: No discharge.        Left eye: No discharge.     Extraocular Movements: Extraocular movements intact.     Conjunctiva/sclera: Conjunctivae normal.     Pupils: Pupils are equal, round, and reactive to light.  Neck:     Thyroid: No thyromegaly.     Vascular: No carotid bruit or JVD.     Trachea: Trachea normal.  Cardiovascular:     Rate and Rhythm: Normal rate and regular rhythm.     Pulses: Normal pulses.     Heart sounds: Normal heart sounds. No murmur heard.    No friction rub. No gallop.  Pulmonary:     Effort: Pulmonary effort is normal. No respiratory distress.     Breath sounds: Normal breath sounds. No wheezing.  Abdominal:     General: Bowel sounds are normal. There is no distension.     Palpations: Abdomen is soft.     Tenderness: There is no abdominal tenderness.  There is no guarding.  Genitourinary:    Exam position: Lithotomy position.     Labia:        Right: No rash, tenderness, lesion or injury.        Left: No rash, tenderness, lesion or injury.      Vagina: Normal.     Cervix: Normal.     Uterus: Normal.      Adnexa: Right adnexa normal.  Musculoskeletal:        General: Normal range of motion.     Cervical back: Normal range of motion and neck supple.  Lymphadenopathy:     Cervical: No cervical adenopathy.  Skin:    General: Skin is warm  and dry.  Neurological:     Mental Status: She is alert and oriented to person, place, and time.     Cranial Nerves: No cranial nerve deficit.  Psychiatric:        Mood and Affect: Mood normal.        Behavior: Behavior normal.        Thought Content: Thought content normal.        Judgment: Judgment normal.      Results for orders placed or performed in visit on 09/17/23  POCT HgB A1C  Result Value Ref Range   Hemoglobin A1C 6.6 (A) 4.0 - 5.6 %   HbA1c POC (<> result, manual entry)     HbA1c, POC (prediabetic range)     HbA1c, POC (controlled diabetic range) 6.6 0.0 - 7.0 %       Assessment & Plan:    Routine Health Maintenance and Physical Exam  Immunization History  Administered Date(s) Administered   Influenza-Unspecified 09/07/2019, 09/25/2020, 08/07/2022   PFIZER(Purple Top)SARS-COV-2 Vaccination 01/15/2020, 02/09/2020    Health Maintenance  Topic Date Due   FOOT EXAM  Never done   OPHTHALMOLOGY EXAM  Never done   Diabetic kidney evaluation - Urine ACR  Never done   MAMMOGRAM  03/13/2022   Cervical Cancer Screening (HPV/Pap Cotest)  02/12/2023   Diabetic kidney evaluation - eGFR measurement  08/26/2023   COVID-19 Vaccine (3 - 2023-24 season) 10/03/2023 (Originally 07/25/2023)   Zoster Vaccines- Shingrix (1 of 2) 12/18/2023 (Originally 02/29/2012)   INFLUENZA VACCINE  02/21/2024 (Originally 06/24/2023)   DTaP/Tdap/Td (1 - Tdap) 09/16/2024 (Originally 02/28/1981)   HEMOGLOBIN  A1C  03/17/2024   Colonoscopy  04/18/2029   Hepatitis C Screening  Completed   HIV Screening  Completed   HPV VACCINES  Aged Out    Discussed health benefits of physical activity, and encouraged her to engage in regular exercise appropriate for her age and condition.  1. Annual physical exam Checking labs as below.  Up-to-date on preventative care.  Wellness information provided with AVS. - Lipid panel - CBC with Differential/Platelet - CMP14+EGFR  2. Essential hypertension History of hypertension currently managed on amlodipine 5 mg daily.  Checking labs as below.  Blood pressure at goal.  Continue amlodipine. - Lipid panel - CBC with Differential/Platelet - CMP14+EGFR - amLODipine (NORVASC) 5 MG tablet; TAKE 1 TABLET(5 MG) BY MOUTH DAILY  Dispense: 90 tablet; Refill: 3  3. Status post partial thyroidectomy Checking TSH today. - TSH  4. Dyslipidemia, goal LDL below 70 Taking atorvastatin 20 mg daily.  Checking labs.  Continue Lipitor as prescribed. - Lipid panel - CMP14+EGFR  5. Prediabetes History of prediabetes with last check at 6.4%.  Rechecking today.  See below. - POCT HgB A1C  6. Controlled type 2 diabetes mellitus without complication, without long-term current use of insulin (HCC) Hemoglobin A1c has jumped up to 6.6%.  She was prescribed metformin to prevent worsening however she was unable to get this due to cost at her pharmacy.  Her A1c is now in the diabetic range.  We had a discussion regarding diabetic care and I will be sending her information on MyChart to review.  She will let me know if she would like a referral to diabetic education.  Sending a glucometer for home use.  Recommend checking sugars twice weekly for fasting goal of 90-120.  Work on dietary modification to limit intake of simple carbohydrates and concentrated sweets.  We sent metformin to a different pharmacy per patient  request with instructions to go ahead and get started on the medication.  Has  an eye appointment coming up and recommended she notify them that she is diabetic so they will do a complete diabetic eye exam.  7. Cervical cancer screening Pap smear completed today with HPV cotesting. - Cytology - PAP  8. Injury of right shoulder, subsequent encounter Continue Flexeril and meloxicam as prescribed. - cyclobenzaprine (FLEXERIL) 10 MG tablet; Take 1 tablet (10 mg total) by mouth 3 (three) times daily as needed for muscle spasms.  Dispense: 90 tablet; Refill: 5 - meloxicam (MOBIC) 15 MG tablet; Take 1 tablet (15 mg total) by mouth daily.  Dispense: 30 tablet; Refill: 1  9. Sleep disturbance Continue doxepin and Ambien as prescribed. - doxepin (SINEQUAN) 25 MG capsule; TAKE 1 CAPSULE BY MOUTH AT BEDTIME AS NEEDED FOR SLEEP OR ANXIETY  Dispense: 90 capsule; Refill: 3  10. Hypokalemia due to excessive renal loss of potassium Continue potassium supplementation twice daily. - potassium chloride SA (KLOR-CON M) 20 MEQ tablet; Take 1 tablet (20 mEq total) by mouth daily.  Dispense: 180 tablet; Refill: 3  11. Migraine without aura and without status migrainosus, not intractable Continue Imitrex as needed for migraines. - SUMAtriptan (IMITREX) 50 MG tablet; Take 1 tablet (50 mg total) by mouth every 2 (two) hours as needed for migraine. May repeat in 2 hours if headache persists or recurs. Maximum daily dose: 2 tablets (100mg ).  Dispense: 10 tablet; Refill: 0  12. Encounter for screening mammogram for malignant neoplasm of breast Mammogram ordered. - MM DIGITAL SCREENING BILATERAL; Future  13.  Anxiety Notes that she struggles significantly with anxiety symptoms but not so much with depression.  Most of her issues are situational and she tends to worry about things.  She does have good coping skills but feels that the anxiety is overwhelming at times.  Interested in medication options.  Adding sertraline 25 mg daily for 1 week then increase to 50 mg daily.  Plan to follow-up in 4-6  weeks.  Return in about 6 weeks (around 10/29/2023) for mood follow up.     Christen Butter, NP

## 2023-09-18 ENCOUNTER — Other Ambulatory Visit: Payer: Self-pay | Admitting: Medical-Surgical

## 2023-09-18 LAB — CMP14+EGFR
ALT: 12 [IU]/L (ref 0–32)
AST: 18 [IU]/L (ref 0–40)
Albumin: 4.2 g/dL (ref 3.9–4.9)
Alkaline Phosphatase: 77 [IU]/L (ref 44–121)
BUN/Creatinine Ratio: 20 (ref 12–28)
BUN: 12 mg/dL (ref 8–27)
Bilirubin Total: 0.2 mg/dL (ref 0.0–1.2)
CO2: 26 mmol/L (ref 20–29)
Calcium: 9.6 mg/dL (ref 8.7–10.3)
Chloride: 101 mmol/L (ref 96–106)
Creatinine, Ser: 0.61 mg/dL (ref 0.57–1.00)
Globulin, Total: 3 g/dL (ref 1.5–4.5)
Glucose: 107 mg/dL — ABNORMAL HIGH (ref 70–99)
Potassium: 3.7 mmol/L (ref 3.5–5.2)
Sodium: 140 mmol/L (ref 134–144)
Total Protein: 7.2 g/dL (ref 6.0–8.5)
eGFR: 102 mL/min/{1.73_m2} (ref >59)

## 2023-09-18 LAB — CBC WITH DIFFERENTIAL/PLATELET
Basophils Absolute: 0 10*3/uL (ref 0.0–0.2)
Basos: 1 %
EOS (ABSOLUTE): 0.1 10*3/uL (ref 0.0–0.4)
Eos: 3 %
Hematocrit: 41.3 % (ref 34.0–46.6)
Hemoglobin: 13.3 g/dL (ref 11.1–15.9)
Immature Grans (Abs): 0 10*3/uL (ref 0.0–0.1)
Immature Granulocytes: 1 %
Lymphocytes Absolute: 1.4 10*3/uL (ref 0.7–3.1)
Lymphs: 31 %
MCH: 28.7 pg (ref 26.6–33.0)
MCHC: 32.2 g/dL (ref 31.5–35.7)
MCV: 89 fL (ref 79–97)
Monocytes Absolute: 0.4 10*3/uL (ref 0.1–0.9)
Monocytes: 9 %
Neutrophils Absolute: 2.5 10*3/uL (ref 1.4–7.0)
Neutrophils: 55 %
Platelets: 252 10*3/uL (ref 150–450)
RBC: 4.63 x10E6/uL (ref 3.77–5.28)
RDW: 14 % (ref 11.7–15.4)
WBC: 4.4 10*3/uL (ref 3.4–10.8)

## 2023-09-18 LAB — LIPID PANEL
Chol/HDL Ratio: 3.8 ratio (ref 0.0–4.4)
Cholesterol, Total: 203 mg/dL — ABNORMAL HIGH (ref 100–199)
HDL: 54 mg/dL (ref >39)
LDL Chol Calc (NIH): 133 mg/dL — ABNORMAL HIGH (ref 0–99)
Triglycerides: 89 mg/dL (ref 0–149)
VLDL Cholesterol Cal: 16 mg/dL (ref 5–40)

## 2023-09-18 LAB — TSH: TSH: 1.66 u[IU]/mL (ref 0.450–4.500)

## 2023-09-18 MED ORDER — ATORVASTATIN CALCIUM 40 MG PO TABS: 40.0000 mg | ORAL_TABLET | Freq: Every day | ORAL | 3 refills | Status: DC

## 2023-09-20 LAB — HM DIABETES EYE EXAM

## 2023-09-21 ENCOUNTER — Encounter: Payer: Self-pay | Admitting: Medical-Surgical

## 2023-09-21 ENCOUNTER — Telehealth: Payer: Self-pay

## 2023-09-21 LAB — CYTOLOGY - PAP
Comment: NEGATIVE
Diagnosis: NEGATIVE
High risk HPV: NEGATIVE

## 2023-09-21 NOTE — Telephone Encounter (Signed)
Per patient - the following rxs require a prior authorizations:  Metformin [facility] pharmacy) Zolpidem [facility] pharmacy) Sertraline ([facility])

## 2023-09-23 ENCOUNTER — Other Ambulatory Visit: Payer: Self-pay | Admitting: Medical-Surgical

## 2023-09-23 MED ORDER — SERTRALINE HCL 50 MG PO TABS: ORAL_TABLET | ORAL | 3 refills | Status: DC

## 2023-09-23 NOTE — Telephone Encounter (Addendum)
Initiated Prior authorization UXL:[id number] Tartrate 5MG  tablets Via: Covermymeds Case/Key:[id number] Status: approved  as of 09/23/23 Reason:Authorization Expiration Date: 09/22/2024 Notified Pt via: Mychart    Initiated Prior authorization OZD:[id number] HCl ER (OSM) 500MG  er tablets Via: Covermymeds Case/Key:[id number] Status: Pending as of 09/23/23 Reason: Notified Pt via: Mychart    Initiated Prior authorization QVZ:[id number]  Via: Covermymeds Case/Key:[id number] Status: approved  as of 09/23/23 Reason: no additional PA is required.  Notified Pt via: Mychart

## 2023-09-27 ENCOUNTER — Encounter: Payer: Self-pay | Admitting: Medical-Surgical

## 2023-09-28 MED ORDER — METFORMIN HCL 500 MG PO TABS: 500.0000 mg | ORAL_TABLET | Freq: Two times a day (BID) | ORAL | 3 refills | Status: DC

## 2023-10-06 ENCOUNTER — Encounter: Payer: Self-pay | Admitting: Medical-Surgical

## 2023-10-07 ENCOUNTER — Telehealth: Payer: Self-pay

## 2023-10-07 MED ORDER — OZEMPIC (0.25 OR 0.5 MG/DOSE) 2 MG/3ML ~~LOC~~ SOPN: PEN_INJECTOR | SUBCUTANEOUS | 0 refills | Status: DC

## 2023-10-07 NOTE — Telephone Encounter (Signed)
Initiated Prior authorization for:s Ozempic (0.25 or 0.5 MG/DOSE) 2MG /3ML Blum SOPN Via: Covermymeds Case/Key:[id number] Status: approved  as of 10/07/23 Reason:The authorization is valid from 09/07/2023 through 10/06/2024 Notified Pt via: Mychart

## 2023-10-14 ENCOUNTER — Encounter: Payer: Self-pay | Admitting: Medical-Surgical

## 2023-10-22 ENCOUNTER — Other Ambulatory Visit: Payer: Self-pay | Admitting: Medical-Surgical

## 2023-10-22 DIAGNOSIS — S4991XD Unspecified injury of right shoulder and upper arm, subsequent encounter: Secondary | ICD-10-CM

## 2023-10-29 ENCOUNTER — Ambulatory Visit: Payer: Federal, State, Local not specified - PPO | Admitting: Medical-Surgical

## 2023-11-10 ENCOUNTER — Ambulatory Visit: Payer: Federal, State, Local not specified - PPO | Admitting: Medical-Surgical

## 2023-11-18 ENCOUNTER — Other Ambulatory Visit: Payer: Self-pay | Admitting: Medical-Surgical

## 2023-11-23 ENCOUNTER — Other Ambulatory Visit: Payer: Self-pay | Admitting: Medical-Surgical

## 2023-11-25 ENCOUNTER — Encounter: Payer: Self-pay | Admitting: Medical-Surgical

## 2023-11-25 ENCOUNTER — Ambulatory Visit: Payer: Federal, State, Local not specified - PPO | Admitting: Medical-Surgical

## 2023-11-25 VITALS — BP 106/68 | HR 84 | Resp 20 | Ht 66.0 in | Wt 187.1 lb

## 2023-11-25 DIAGNOSIS — I10 Essential (primary) hypertension: Secondary | ICD-10-CM

## 2023-11-25 DIAGNOSIS — E119 Type 2 diabetes mellitus without complications: Secondary | ICD-10-CM | POA: Diagnosis not present

## 2023-11-25 DIAGNOSIS — S4991XD Unspecified injury of right shoulder and upper arm, subsequent encounter: Secondary | ICD-10-CM | POA: Diagnosis not present

## 2023-11-25 DIAGNOSIS — F419 Anxiety disorder, unspecified: Secondary | ICD-10-CM | POA: Diagnosis not present

## 2023-11-25 LAB — POCT UA - MICROALBUMIN
Albumin/Creatinine Ratio, Urine, POC: <30
Creatinine, POC: 100 mg/dL
Microalbumin Ur, POC: 30 mg/L

## 2023-11-25 MED ORDER — OZEMPIC (0.25 OR 0.5 MG/DOSE) 2 MG/3ML ~~LOC~~ SOPN: 0.5000 mg | PEN_INJECTOR | SUBCUTANEOUS | 1 refills | Status: DC

## 2023-11-25 MED ORDER — MELOXICAM 15 MG PO TABS: 15.0000 mg | ORAL_TABLET | Freq: Every day | ORAL | 1 refills | Status: DC

## 2023-11-25 MED ORDER — SERTRALINE HCL 100 MG PO TABS: 100.0000 mg | ORAL_TABLET | Freq: Every day | ORAL | 1 refills | Status: DC

## 2023-11-25 MED ORDER — ZOLPIDEM TARTRATE 5 MG PO TABS: ORAL_TABLET | ORAL | 1 refills | Status: DC

## 2023-11-25 NOTE — Progress Notes (Signed)
        Established patient visit  History, exam, impression, and plan:  1. Anxiety (Primary) Pleasant 63 year old female presenting today for follow-up on anxiety.  At our last visit, we discussed starting sertraline  and she was willing to give it a try.  She is currently taking sertraline  50 mg daily, tolerating well without side effects.  Feels the medication is helping tremendously but has a little bit of room for improvement.  She is interested in possibly trying a higher dose.  Denies SI/HI.  Today, her mood, thought pattern, cognition, affect, and speech pattern are all normal.  Increasing sertraline  to 100 mg daily with a plan to follow-up in about 4-6 weeks.  2. Controlled type 2 diabetes mellitus without complication, without long-term current use of insulin (HCC) Is doing well on Ozempic  0.25 mg weekly.  Has had 3 injections so far and has not noticed any side effects.  She is ready to go up to the next dose after completing her fourth injection.  Hemoglobin A1c was checked today although it was too early.  As this was not due, we will not be charging her for this however her result was 6.1% indicating she is doing very well on her current regimen.  Plan to increase Ozempic  to 0.5 mg weekly.  Reduce metformin  to 500 mg once daily.  3. Essential hypertension History of hypertension and is currently compliant with amlodipine  5 mg daily and chlorthalidone  25 mg daily.  Blood pressure is at goal today.  Cardiopulmonary exam is reassuring.  Continue amlodipine  and chlorthalidone  as prescribed.  4. Injury of right shoulder, subsequent encounter Has continued using meloxicam  15 mg daily for generalized aches and pains related to osteoarthritis.  Feels that the medication helps quite well and has had no side effects.  Refilling today. - meloxicam  (MOBIC ) 15 MG tablet; Take 1 tablet (15 mg total) by mouth daily.  Dispense: 30 tablet; Refill: 1   Procedures performed this visit: None.  Return  in about 4 weeks (around 12/23/2023) for mood follow up.  __________________________________ Zada FREDRIK Palin, DNP, APRN, FNP-BC Primary Care and Sports Medicine [facility] [place]

## 2023-12-23 ENCOUNTER — Ambulatory Visit (INDEPENDENT_AMBULATORY_CARE_PROVIDER_SITE_OTHER): Payer: Federal, State, Local not specified - PPO | Admitting: Medical-Surgical

## 2023-12-23 ENCOUNTER — Encounter: Payer: Self-pay | Admitting: Medical-Surgical

## 2023-12-23 VITALS — BP 109/76 | HR 88 | Temp 98.2°F | Resp 20 | Ht 66.0 in | Wt 188.0 lb

## 2023-12-23 DIAGNOSIS — F419 Anxiety disorder, unspecified: Secondary | ICD-10-CM

## 2023-12-23 DIAGNOSIS — U071 COVID-19: Secondary | ICD-10-CM

## 2023-12-23 DIAGNOSIS — I10 Essential (primary) hypertension: Secondary | ICD-10-CM

## 2023-12-23 LAB — POCT INFLUENZA A/B
Influenza A, POC: NEGATIVE
Influenza B, POC: NEGATIVE

## 2023-12-23 LAB — POC COVID19 BINAXNOW: SARS Coronavirus 2 Ag: POSITIVE — AB

## 2023-12-23 MED ORDER — HYDROCOD POLI-CHLORPHE POLI ER 10-8 MG/5ML PO SUER: 5.0000 mL | Freq: Two times a day (BID) | ORAL | 0 refills | Status: DC | PRN

## 2023-12-23 MED ORDER — BENZONATATE 100 MG PO CAPS: 100.0000 mg | ORAL_CAPSULE | Freq: Three times a day (TID) | ORAL | 0 refills | Status: DC | PRN

## 2023-12-23 MED ORDER — NIRMATRELVIR/RITONAVIR (PAXLOVID)TABLET: 3.0000 | ORAL_TABLET | Freq: Two times a day (BID) | ORAL | 0 refills | Status: AC

## 2023-12-23 NOTE — Patient Instructions (Addendum)
For cough:   Use Tessalon perls for cough three times daily as needed Use Tussionex cough syrup at nightly before bed  Ok to use Coricidin or Nyquil/Dayquil HBP for other symptoms

## 2023-12-23 NOTE — Progress Notes (Signed)
        Established patient visit  History, exam, impression, and plan:  1. Anxiety (Primary) Pleasant 63 year old female presenting today for follow-up on anxiety.  Approximately 4 weeks ago, we increased her Zoloft from 50 mg daily to 100 mg daily.  She has tolerated the increase well and has had no side effects.  Feels that this dose of the medication is working very well for her and has helped to control the breakthrough anxiety she was experiencing.  Is happy with her current dose and does not desire any further changes at this time.  Denies SI/HI.  Continue Zoloft 100 mg daily as prescribed.  2. Essential hypertension She does have a history of hypertension currently well-managed with amlodipine 5 mg daily and chlorthalidone 25 mg daily.  Blood pressure is elevated on arrival today however she is also experiencing viral symptoms.  Has been using Mucinex lozenges which may have had an effect on her blood pressure.  Recheck at the end of the appointment shows very well-controlled blood pressure.  No changes in medications.  3. COVID-19 virus infection Reports recent exposure to sick grandchildren and her husband is also sick.  She has had 2 days of symptoms including headache, body ache, sinus congestion, postnasal drip, cough, scratchy throat, and generalized malaise.  Has not noted any fevers but feels like she is running hot.  Denies chills, shortness of breath, chest pain, and GI symptoms.  As noted above, has been using Mucinex lozenges but no other home treatments.  POCT flu negative but COVID was positive.  Adding Tessalon Perles every 8 hours as needed for cough.  Use Tussionex at bedtime for nighttime cough management.  Okay to use Delsym during the day.  Adding Paxlovid twice daily x 5 days.  Continue Flonase. - benzonatate (TESSALON) 100 MG capsule; Take 1 capsule (100 mg total) by mouth 3 (three) times daily as needed for cough.  Dispense: 30 capsule; Refill: 0 -  chlorpheniramine-HYDROcodone (TUSSIONEX) 10-8 MG/5ML; Take 5 mLs by mouth every 12 (twelve) hours as needed for cough (cough, will cause drowsiness.).  Dispense: 120 mL; Refill: 0 - nirmatrelvir/ritonavir (PAXLOVID) 20 x 150 MG & 10 x 100MG  TABS; Take 3 tablets by mouth 2 (two) times daily for 5 days. (Take nirmatrelvir 150 mg two tablets twice daily for 5 days and ritonavir 100 mg one tablet twice daily for 5 days) Patient GFR is 102  Dispense: 30 tablet; Refill: 0   Procedures performed this visit: None.  Return in about 3 months (around 03/22/2024) for DM/HTN/HLD/mood follow up.  __________________________________ Thayer Ohm, DNP, APRN, FNP-BC Primary Care and Sports Medicine [facility]

## 2024-01-13 ENCOUNTER — Other Ambulatory Visit: Payer: Self-pay | Admitting: Medical-Surgical

## 2024-01-13 DIAGNOSIS — U071 COVID-19: Secondary | ICD-10-CM

## 2024-01-13 MED ORDER — BENZONATATE 100 MG PO CAPS: 100.0000 mg | ORAL_CAPSULE | Freq: Three times a day (TID) | ORAL | 0 refills | Status: DC | PRN

## 2024-01-13 MED ORDER — HYDROCOD POLI-CHLORPHE POLI ER 10-8 MG/5ML PO SUER: 5.0000 mL | Freq: Two times a day (BID) | ORAL | 0 refills | Status: DC | PRN

## 2024-02-01 ENCOUNTER — Other Ambulatory Visit: Payer: Self-pay | Admitting: Medical-Surgical

## 2024-02-01 DIAGNOSIS — S4991XD Unspecified injury of right shoulder and upper arm, subsequent encounter: Secondary | ICD-10-CM

## 2024-03-15 ENCOUNTER — Ambulatory Visit: Payer: Self-pay

## 2024-03-15 ENCOUNTER — Encounter: Payer: Self-pay | Admitting: Physician Assistant

## 2024-03-15 ENCOUNTER — Ambulatory Visit: Admitting: Physician Assistant

## 2024-03-15 VITALS — BP 126/69 | HR 84 | Temp 98.0°F | Ht 66.0 in | Wt 186.0 lb

## 2024-03-15 DIAGNOSIS — J0100 Acute maxillary sinusitis, unspecified: Secondary | ICD-10-CM

## 2024-03-15 DIAGNOSIS — R6889 Other general symptoms and signs: Secondary | ICD-10-CM

## 2024-03-15 MED ORDER — AMOXICILLIN-POT CLAVULANATE 875-125 MG PO TABS: 1.0000 | ORAL_TABLET | Freq: Two times a day (BID) | ORAL | 0 refills | Status: DC

## 2024-03-15 NOTE — Telephone Encounter (Signed)
 Patient seen in office today by Tandy Gaw, PA.

## 2024-03-15 NOTE — Patient Instructions (Signed)

## 2024-03-15 NOTE — Telephone Encounter (Signed)
 Copied From CRM [id number]. Reason for Triage: Patient states she has some type of cold/infection. She has been stuffy, congested, runny nose    Chief Complaint: sinus congestion Symptoms: sinus pressure, sore throat, ears popping, fever  Disposition: [] ED /[] Urgent Care (no appt availability in office) / [x] Appointment(In office/virtual)/ []  [facility]/ [] Home Care/ [] Refused Recommended Disposition /[] [facility]/ []  Follow-up with PCP Additional Notes: Pt complaining of sinus pressure under eyes/side of head, sore throat, cough, ears popping. Pt  stated this started 2 days ago. Pt has allergies and tried to manage symptoms but feels it has gotten worse. Pt feels she had fever yesterday but didn't have thermometer. Pt has appt today at 1440. RN gave care some advice but pt in hurry to make appt on time.  Pt verbalized understanding.        Reason for Disposition  [1] Sinus pain (not just congestion) AND [2] fever  Answer Assessment - Initial Assessment Questions 1. LOCATION: "Where does it hurt?"      Pressure under eyes and side of head 2. ONSET: "When did the sinus pain start?"  (e.g., hours, days)      2 days ago 3. SEVERITY: "How bad is the pain?"   (Scale 1-10; mild, moderate or severe)   - MILD (1-3): doesn't interfere with normal activities    - MODERATE (4-7): interferes with normal activities (e.g., work or school) or awakens from sleep   - SEVERE (8-10): excruciating pain and patient unable to do any normal activities        7 4. RECURRENT SYMPTOM: "Have you ever had sinus problems before?" If Yes, ask: "When was the last time?" and "What happened that time?"      Has allergies 5. NASAL CONGESTION: "Is the nose blocked?" If Yes, ask: "Can you open it or must you breathe through your mouth?"     yes 6. NASAL DISCHARGE: "Do you have discharge from your nose?" If so ask, "What color?"     Thick, yellow/green  7. FEVER: "Do you have a fever?" If Yes,  ask: "What is it, how was it measured, and when did it start?"      Felt warm yesterday  8. OTHER SYMPTOMS: "Do you have any other symptoms?" (e.g., sore throat, cough, earache, difficulty breathing)     Sore throat, cough, ear popping  Protocols used: Sinus Pain or Congestion-A-AH

## 2024-03-17 ENCOUNTER — Encounter: Payer: Self-pay | Admitting: Physician Assistant

## 2024-03-17 NOTE — Progress Notes (Signed)
   Acute Office Visit  Subjective:     Patient ID: Hailey Robinson, female    DOB: [birth year], 62 y.o.   MRN: [id number]  Chief Complaint  Patient presents with   Cough    HPI Patient is in today for almost 2 weeks of sinus fullness and pressure, cough, nasal congestion, headache, ear popping. She felt like she was getting better with dayquil and then started getting worse again. No fever, chills, body aches. Her throat is sore a little.   ROS  See HPI.     Objective:    BP 126/69   Pulse 84   Temp 98 F (36.7 C) (Oral)   Ht 5\' 6"  (1.676 m)   Wt 186 lb (84.4 kg)   LMP 01/24/2018 (Approximate)   SpO2 99%   BMI 30.02 kg/m  BP Readings from Last 3 Encounters:  03/15/24 126/69  12/23/23 109/76  11/25/23 106/68   Wt Readings from Last 3 Encounters:  03/15/24 186 lb (84.4 kg)  12/23/23 188 lb (85.3 kg)  11/25/23 187 lb 1.6 oz (84.9 kg)        Physical Exam Constitutional:      Appearance: She is ill-appearing.  HENT:     Head: Normocephalic.     Comments: Tenderness and fullness over maxillary sinuses to palpation.     Right Ear: Ear canal and external ear normal. There is no impacted cerumen.     Left Ear: Ear canal and external ear normal. There is no impacted cerumen.     Ears:     Comments: TM's erythematous and bulging, bilaterally.     Nose: Congestion present.     Mouth/Throat:     Mouth: Mucous membranes are moist.     Pharynx: Posterior oropharyngeal erythema present. No oropharyngeal exudate.  Eyes:     General:        Right eye: Discharge present.        Left eye: Discharge present.    Comments: Bilateral watery eyes with slightly injected conjunctiva.   Cardiovascular:     Rate and Rhythm: Normal rate and regular rhythm.  Pulmonary:     Effort: Pulmonary effort is normal.     Breath sounds: Normal breath sounds.  Musculoskeletal:     Cervical back: Normal range of motion. No tenderness.  Lymphadenopathy:     Cervical: Cervical adenopathy  present.  Neurological:     General: No focal deficit present.     Mental Status: She is alert and oriented to person, place, and time.  Psychiatric:        Mood and Affect: Mood normal.         Assessment & Plan:  Aaron AasAaron AasRosa was seen today for cough.  Diagnoses and all orders for this visit:  Acute non-recurrent maxillary sinusitis -     amoxicillin -clavulanate (AUGMENTIN ) 875-125 MG tablet; Take 1 tablet by mouth 2 (two) times daily.  Other orders -     Cancel: POC COVID-19 BinaxNow -     Cancel: POCT rapid strep A -     Cancel: POCT Influenza A/B   Over a week of URI symptoms with improvement and then worsening  Treated for sinus infection with augmentin  Use flonase  that she has at home Rest and hydrate Consider nasal saline rinses Follow up as needed if symptoms persist or worsen  Sandy Crumb, PA-C

## 2024-03-22 ENCOUNTER — Ambulatory Visit: Payer: Federal, State, Local not specified - PPO | Admitting: Medical-Surgical

## 2024-03-27 ENCOUNTER — Other Ambulatory Visit: Payer: Self-pay | Admitting: Medical-Surgical

## 2024-03-27 ENCOUNTER — Telehealth: Payer: Self-pay

## 2024-03-27 MED ORDER — METFORMIN HCL 500 MG PO TABS: 500.0000 mg | ORAL_TABLET | Freq: Every day | ORAL | 3 refills | Status: AC

## 2024-03-27 NOTE — Telephone Encounter (Signed)
 Copied from CRM [id number]. Topic: Clinical - Prescription Issue >> Mar 27, 2024  9:12 AM Blair Bumpers wrote: Reason for CRM: [place], with [facility] [place], called in stating that they received a prescription for  Metformin  Extended Release Osmotic but the insurance is not paying for it. She wanted to verify if that is the correct prescription that was suppose to be sent or something else. Please give her a call back to advise. CB #: [phone]

## 2024-03-27 NOTE — Telephone Encounter (Signed)
 Spoke with pharmacy  States the prescription for the metformin  ER osmotic was a transferred script from October.  Informed her that we had sent a rx in November for plain metformin  500mg  taken BID - technician states this was d/c by patient In February stated that there was a dosage change and was awaiting new dosage to be sent but one was never received.   In chart note from 11/25/23 reads " Reduce metformin  to 500 mg once daily. " Please advise.

## 2024-03-30 ENCOUNTER — Ambulatory Visit: Admitting: Medical-Surgical

## 2024-03-30 NOTE — Progress Notes (Deleted)
   Established patient visit  History, exam, impression, and plan:  No problem-specific Assessment & Plan notes found for this encounter.   ROS  Physical Exam  Procedures performed this visit: None.  No follow-ups on file.  __________________________________ Thayer Ohm, DNP, APRN, FNP-BC Primary Care and Sports Medicine [facility]

## 2024-04-05 ENCOUNTER — Encounter: Payer: Self-pay | Admitting: Medical-Surgical

## 2024-04-05 ENCOUNTER — Ambulatory Visit: Admitting: Medical-Surgical

## 2024-04-05 VITALS — BP 106/67 | HR 85 | Resp 20 | Ht 66.0 in | Wt 181.0 lb

## 2024-04-05 DIAGNOSIS — E785 Hyperlipidemia, unspecified: Secondary | ICD-10-CM | POA: Diagnosis not present

## 2024-04-05 DIAGNOSIS — Z1231 Encounter for screening mammogram for malignant neoplasm of breast: Secondary | ICD-10-CM

## 2024-04-05 DIAGNOSIS — I10 Essential (primary) hypertension: Secondary | ICD-10-CM

## 2024-04-05 DIAGNOSIS — E119 Type 2 diabetes mellitus without complications: Secondary | ICD-10-CM | POA: Diagnosis not present

## 2024-04-05 DIAGNOSIS — Z7984 Long term (current) use of oral hypoglycemic drugs: Secondary | ICD-10-CM

## 2024-04-05 DIAGNOSIS — E890 Postprocedural hypothyroidism: Secondary | ICD-10-CM | POA: Diagnosis not present

## 2024-04-05 DIAGNOSIS — S4991XD Unspecified injury of right shoulder and upper arm, subsequent encounter: Secondary | ICD-10-CM

## 2024-04-05 DIAGNOSIS — G43009 Migraine without aura, not intractable, without status migrainosus: Secondary | ICD-10-CM

## 2024-04-05 DIAGNOSIS — G479 Sleep disorder, unspecified: Secondary | ICD-10-CM

## 2024-04-05 DIAGNOSIS — F419 Anxiety disorder, unspecified: Secondary | ICD-10-CM

## 2024-04-05 LAB — POCT GLYCOSYLATED HEMOGLOBIN (HGB A1C)
HbA1c, POC (controlled diabetic range): 5.8 % (ref 0.0–7.0)
Hemoglobin A1C: 5.8 % — AB (ref 4.0–5.6)

## 2024-04-05 MED ORDER — MELOXICAM 15 MG PO TABS: ORAL_TABLET | ORAL | 3 refills | Status: AC

## 2024-04-05 MED ORDER — FLUCONAZOLE 150 MG PO TABS: 150.0000 mg | ORAL_TABLET | Freq: Once | ORAL | 0 refills | Status: AC

## 2024-04-05 MED ORDER — METHOCARBAMOL 500 MG PO TABS: 500.0000 mg | ORAL_TABLET | Freq: Three times a day (TID) | ORAL | 3 refills | Status: DC

## 2024-04-05 NOTE — Progress Notes (Signed)
 Established patient visit  History, exam, impression, and plan:  1. Controlled type 2 diabetes mellitus without complication, without long-term current use of insulin (HCC) (Primary) Pleasant 63 year old female presenting today with a history of controlled type 2 diabetes.  She is currently taking Ozempic  0.5 mg weekly and metformin  500 mg daily.  Tolerating both medications well without side effects.  Checking sugars on occasion and notes that they usually look good.  Up-to-date on her eye exams and other preventative care per diabetes guidelines.  Previous POCT hemoglobin A1c was 6.6% 6 months ago.  Recheck today at 5.8% showing improvement and continued control.  Checking CMP today.  Continue metformin  and Ozempic  as prescribed. - POCT HgB A1C - CMP14+EGFR  2. Essential hypertension History of hypertension well-managed with amlodipine  5 mg and chlorthalidone  25 mg daily.  Tolerating both medications well without side effects.  Occasionally checks blood pressure and notes that her readings at home are at goal. Denies CP, SOB, palpitations, lower extremity edema, dizziness, headaches, or vision changes.  Cardiopulmonary exam is normal today.  Checking labs as below.  Blood pressure at goal at 106/67.  Continue amlodipine  and chlorthalidone  as prescribed. - CMP14+EGFR - Lipid panel - CBC  3. Dyslipidemia, goal LDL below 70 Currently taking Lipitor 40 mg daily, tolerating well without side effects.  Working on a low-fat heart healthy diet and stays physically active at work.  No outside intentional exercise.  Denies any concerning symptoms today.  Plan to check labs as below.  Continue Lipitor as prescribed. - CMP14+EGFR - Lipid panel  4. Status post partial thyroidectomy Not currently taking any medications for thyroid  replacement.  Checking TSH today. - TSH  5. Injury of right shoulder, subsequent encounter Has been taking meloxicam  15 mg daily which tremendously helps with body  aches and pains.  Finds it most beneficial with her right knee which has been bothering her.  Her injury of the right shoulder has greatly improved.  She finds the meloxicam  beneficial for other arthralgias/myalgias, continue meloxicam  15 mg daily as needed. - meloxicam  (MOBIC ) 15 MG tablet; TAKE 1 TABLET(15 MG) BY MOUTH DAILY  Dispense: 90 tablet; Refill: 3  6. Migraine without aura and without status migrainosus, not intractable Previously treated migraines with methocarbamol  and as needed Imitrex  but has not had any issues with migraines in a while.  When she does have breakthrough, she has found Flexeril  to be more beneficial than the methocarbamol .  Since her migraines are currently well-controlled with infrequent occurrences, continue as needed Flexeril  and Imitrex .  Discontinue methocarbamol .  7. Encounter for screening mammogram for malignant neoplasm of breast Mammogram ordered. - MM DIGITAL SCREENING BILATERAL; Future  8.  Sleep disturbance Taking Ambien  5 mg nightly, tolerating well without side effects.  No excessive grogginess or behavioral disturbances.  Finds the medication works very well and she is able to get to sleep quickly and stay asleep all night.  Admits that she has difficulty sleeping without the medication.  Recommend working on sleep hygiene measures.  Continue Ambien  5 mg nightly as prescribed.  9.  Anxiety Currently taking Zoloft  100 mg daily as well as doxepin  25 mg nightly.  Tolerating well without side effects.  Feels that the medications are working well for her and that her symptoms are very well-controlled.  Mood, affect, speech pattern, thoughts, and cognition all normal today.  Denies SI/HI.  Continue Zoloft  and doxepin  as prescribed.  Procedures performed this visit: None.  Return in  about 6 months (around 10/06/2024) for DM/HTN/HLD follow up.  __________________________________ Hailey Snook, DNP, APRN, FNP-BC Primary Care and Sports Medicine [facility] [place]

## 2024-04-06 ENCOUNTER — Ambulatory Visit: Payer: Self-pay | Admitting: Medical-Surgical

## 2024-04-06 LAB — CMP14+EGFR
ALT: 12 IU/L (ref 0–32)
AST: 17 IU/L (ref 0–40)
Albumin: 4.3 g/dL (ref 3.9–4.9)
Alkaline Phosphatase: 115 IU/L (ref 44–121)
BUN/Creatinine Ratio: 21 (ref 12–28)
BUN: 12 mg/dL (ref 8–27)
Bilirubin Total: 0.3 mg/dL (ref 0.0–1.2)
CO2: 26 mmol/L (ref 20–29)
Calcium: 9.3 mg/dL (ref 8.7–10.3)
Chloride: 97 mmol/L (ref 96–106)
Creatinine, Ser: 0.57 mg/dL (ref 0.57–1.00)
Globulin, Total: 3.1 g/dL (ref 1.5–4.5)
Glucose: 94 mg/dL (ref 70–99)
Potassium: 3 mmol/L — ABNORMAL LOW (ref 3.5–5.2)
Sodium: 141 mmol/L (ref 134–144)
Total Protein: 7.4 g/dL (ref 6.0–8.5)
eGFR: 103 mL/min/{1.73_m2} (ref >59)

## 2024-04-06 LAB — CBC
Hematocrit: 38.6 % (ref 34.0–46.6)
Hemoglobin: 12.5 g/dL (ref 11.1–15.9)
MCH: 28.8 pg (ref 26.6–33.0)
MCHC: 32.4 g/dL (ref 31.5–35.7)
MCV: 89 fL (ref 79–97)
Platelets: 285 10*3/uL (ref 150–450)
RBC: 4.34 x10E6/uL (ref 3.77–5.28)
RDW: 13.4 % (ref 11.7–15.4)
WBC: 4 10*3/uL (ref 3.4–10.8)

## 2024-04-06 LAB — LIPID PANEL
Chol/HDL Ratio: 2.6 ratio (ref 0.0–4.4)
Cholesterol, Total: 120 mg/dL (ref 100–199)
HDL: 46 mg/dL (ref >39)
LDL Chol Calc (NIH): 60 mg/dL (ref 0–99)
Triglycerides: 69 mg/dL (ref 0–149)
VLDL Cholesterol Cal: 14 mg/dL (ref 5–40)

## 2024-04-06 LAB — TSH: TSH: 1.43 u[IU]/mL (ref 0.450–4.500)

## 2024-04-07 MED ORDER — POTASSIUM CHLORIDE 20 MEQ PO PACK: 20.0000 meq | PACK | Freq: Two times a day (BID) | ORAL | 1 refills | Status: AC

## 2024-04-10 ENCOUNTER — Encounter: Payer: Self-pay | Admitting: Medical-Surgical

## 2024-04-11 NOTE — Telephone Encounter (Signed)
 This message was sent regarding patient husband -[name]- was not meant to be regarding patient Hailey Robinson.

## 2024-04-13 NOTE — Telephone Encounter (Signed)
 Called patient to inform her that FMLA Paperwork is ready for pickup and informed of $29 fee, thanks.

## 2024-04-14 NOTE — Telephone Encounter (Signed)
 Patient came into office to get her FMLA Paperwork today and paid her $29 fee thanks.

## 2024-04-20 ENCOUNTER — Other Ambulatory Visit: Payer: Self-pay | Admitting: Medical-Surgical

## 2024-04-20 MED ORDER — CHLORTHALIDONE 25 MG PO TABS: 25.0000 mg | ORAL_TABLET | Freq: Every day | ORAL | 3 refills | Status: AC

## 2024-04-20 NOTE — Telephone Encounter (Signed)
 Copied from CRM [id number]. Topic: Clinical - Medication Refill >> Apr 20, 2024 12:55 PM Hailey Robinson wrote: Medication: chlorthalidone  (HYGROTON ) 25 MG tablet  Has the patient contacted their pharmacy? Yes (Agent: If no, request that the patient contact the pharmacy for the refill. If patient does not wish to contact the pharmacy document the reason why and proceed with request.) (Agent: If yes, when and what did the pharmacy advise?)  This is the patient's preferred pharmacy:  [id number] - [place], [place] - [phone] AT [facility] [address] [place] [address] Phone: [phone] Fax: [phone]  Is this the correct pharmacy for this prescription? Yes If no, delete pharmacy and type the correct one.   Has the prescription been filled recently? Yes  Is the patient out of the medication? Yes  Has the patient been seen for an appointment in the last year OR does the patient have an upcoming appointment? Yes  Can we respond through MyChart? Yes  Agent: Please be advised that Rx refills may take up to 3 business days. We ask that you follow-up with your pharmacy.

## 2024-04-26 ENCOUNTER — Ambulatory Visit

## 2024-04-26 ENCOUNTER — Other Ambulatory Visit: Payer: Self-pay | Admitting: Medical-Surgical

## 2024-04-26 DIAGNOSIS — E119 Type 2 diabetes mellitus without complications: Secondary | ICD-10-CM

## 2024-04-26 DIAGNOSIS — Z1231 Encounter for screening mammogram for malignant neoplasm of breast: Secondary | ICD-10-CM

## 2024-04-30 ENCOUNTER — Other Ambulatory Visit: Payer: Self-pay | Admitting: Medical-Surgical

## 2024-04-30 DIAGNOSIS — E119 Type 2 diabetes mellitus without complications: Secondary | ICD-10-CM

## 2024-04-30 DIAGNOSIS — G4700 Insomnia, unspecified: Secondary | ICD-10-CM

## 2024-05-01 ENCOUNTER — Other Ambulatory Visit: Payer: Self-pay | Admitting: Medical-Surgical

## 2024-05-01 ENCOUNTER — Other Ambulatory Visit: Payer: Self-pay

## 2024-05-01 DIAGNOSIS — E119 Type 2 diabetes mellitus without complications: Secondary | ICD-10-CM

## 2024-05-01 DIAGNOSIS — G4700 Insomnia, unspecified: Secondary | ICD-10-CM

## 2024-05-01 MED ORDER — ZOLPIDEM TARTRATE 5 MG PO TABS: ORAL_TABLET | ORAL | 1 refills | Status: DC

## 2024-05-03 ENCOUNTER — Other Ambulatory Visit: Payer: Self-pay | Admitting: Medical-Surgical

## 2024-05-03 DIAGNOSIS — R928 Other abnormal and inconclusive findings on diagnostic imaging of breast: Secondary | ICD-10-CM

## 2024-05-10 ENCOUNTER — Other Ambulatory Visit: Payer: Self-pay | Admitting: Medical-Surgical

## 2024-05-10 ENCOUNTER — Ambulatory Visit
Admission: RE | Admit: 2024-05-10 | Discharge: 2024-05-10 | Disposition: A | Discharge status: Home / Self Care | Source: Ambulatory Visit | Attending: Medical-Surgical | Admitting: Medical-Surgical

## 2024-05-10 ENCOUNTER — Ambulatory Visit
Admission: RE | Admit: 2024-05-10 | Discharge: 2024-05-10 | Disposition: A | Discharge status: Home / Self Care | Source: Ambulatory Visit | Attending: Medical-Surgical

## 2024-05-10 DIAGNOSIS — R928 Other abnormal and inconclusive findings on diagnostic imaging of breast: Secondary | ICD-10-CM

## 2024-05-10 DIAGNOSIS — N6320 Unspecified lump in the left breast, unspecified quadrant: Secondary | ICD-10-CM

## 2024-05-10 DIAGNOSIS — N6323 Unspecified lump in the left breast, lower outer quadrant: Secondary | ICD-10-CM | POA: Diagnosis not present

## 2024-05-15 ENCOUNTER — Ambulatory Visit
Admission: RE | Admit: 2024-05-15 | Discharge: 2024-05-15 | Disposition: A | Discharge status: Home / Self Care | Source: Ambulatory Visit | Attending: Medical-Surgical | Admitting: Medical-Surgical

## 2024-05-15 ENCOUNTER — Ambulatory Visit
Admission: RE | Admit: 2024-05-15 | Discharge: 2024-05-15 | Discharge status: Home / Self Care | Source: Ambulatory Visit | Attending: Medical-Surgical

## 2024-05-15 DIAGNOSIS — R928 Other abnormal and inconclusive findings on diagnostic imaging of breast: Secondary | ICD-10-CM

## 2024-05-15 DIAGNOSIS — N6320 Unspecified lump in the left breast, unspecified quadrant: Secondary | ICD-10-CM

## 2024-05-15 DIAGNOSIS — N62 Hypertrophy of breast: Secondary | ICD-10-CM | POA: Diagnosis not present

## 2024-05-15 DIAGNOSIS — R921 Mammographic calcification found on diagnostic imaging of breast: Secondary | ICD-10-CM | POA: Diagnosis not present

## 2024-05-15 DIAGNOSIS — N6323 Unspecified lump in the left breast, lower outer quadrant: Secondary | ICD-10-CM | POA: Diagnosis not present

## 2024-05-15 HISTORY — PX: BREAST BIOPSY: SHX20

## 2024-05-16 LAB — SURGICAL PATHOLOGY

## 2024-05-18 ENCOUNTER — Other Ambulatory Visit: Payer: Self-pay | Admitting: Medical-Surgical

## 2024-05-18 DIAGNOSIS — R921 Mammographic calcification found on diagnostic imaging of breast: Secondary | ICD-10-CM

## 2024-05-27 ENCOUNTER — Encounter: Payer: Self-pay | Admitting: Medical-Surgical

## 2024-05-29 MED ORDER — OZEMPIC (1 MG/DOSE) 4 MG/3ML ~~LOC~~ SOPN: 1.0000 mg | PEN_INJECTOR | SUBCUTANEOUS | 0 refills | Status: DC

## 2024-06-08 ENCOUNTER — Other Ambulatory Visit: Payer: Self-pay | Admitting: Medical-Surgical

## 2024-06-08 ENCOUNTER — Inpatient Hospital Stay
Admission: RE | Admit: 2024-06-08 | Discharge: 2024-06-08 | Discharge status: Home / Self Care | Source: Ambulatory Visit | Attending: Medical-Surgical

## 2024-06-08 ENCOUNTER — Ambulatory Visit
Admission: RE | Admit: 2024-06-08 | Discharge: 2024-06-08 | Disposition: A | Discharge status: Home / Self Care | Source: Ambulatory Visit | Attending: Medical-Surgical

## 2024-06-08 DIAGNOSIS — N6320 Unspecified lump in the left breast, unspecified quadrant: Secondary | ICD-10-CM

## 2024-06-08 DIAGNOSIS — R921 Mammographic calcification found on diagnostic imaging of breast: Secondary | ICD-10-CM

## 2024-06-08 DIAGNOSIS — N6012 Diffuse cystic mastopathy of left breast: Secondary | ICD-10-CM | POA: Diagnosis not present

## 2024-06-08 HISTORY — PX: BREAST BIOPSY: SHX20

## 2024-06-09 LAB — SURGICAL PATHOLOGY

## 2024-06-12 ENCOUNTER — Other Ambulatory Visit: Payer: Self-pay | Admitting: Medical-Surgical

## 2024-06-12 DIAGNOSIS — G4700 Insomnia, unspecified: Secondary | ICD-10-CM

## 2024-06-13 ENCOUNTER — Other Ambulatory Visit

## 2024-06-14 NOTE — Telephone Encounter (Signed)
 Upcoming appointment 10/06/2024  Last OV 04/05/2024  Last filled 05/01/2024

## 2024-06-18 ENCOUNTER — Encounter: Payer: Self-pay | Admitting: Medical-Surgical

## 2024-07-04 ENCOUNTER — Ambulatory Visit: Payer: Self-pay | Admitting: General Surgery

## 2024-07-04 DIAGNOSIS — N6489 Other specified disorders of breast: Secondary | ICD-10-CM

## 2024-07-18 ENCOUNTER — Other Ambulatory Visit: Payer: Self-pay | Admitting: Medical-Surgical

## 2024-07-18 DIAGNOSIS — G4700 Insomnia, unspecified: Secondary | ICD-10-CM

## 2024-07-20 NOTE — Telephone Encounter (Signed)
 Last filled 05/01/2024  Last OV 04/05/2024  Upcoming 10/06/2024

## 2024-08-14 ENCOUNTER — Encounter: Payer: Self-pay | Admitting: Medical-Surgical

## 2024-08-15 MED ORDER — OZEMPIC (1 MG/DOSE) 4 MG/3ML ~~LOC~~ SOPN: 1.0000 mg | PEN_INJECTOR | SUBCUTANEOUS | 0 refills | Status: DC

## 2024-08-15 NOTE — Telephone Encounter (Signed)
 Requesting rx rf of Ozempic   Last written 1mg   05/29/2024 Last OV 04/05/2024 Upcoming appt 10/06/2024/

## 2024-08-24 ENCOUNTER — Telehealth: Payer: Self-pay

## 2024-08-24 MED ORDER — SERTRALINE HCL 100 MG PO TABS: 100.0000 mg | ORAL_TABLET | Freq: Every day | ORAL | 1 refills | Status: AC

## 2024-08-24 NOTE — Addendum Note (Signed)
 Addended byBETHA WILLO MINI on: 08/24/2024 12:26 PM   Modules accepted: Orders

## 2024-08-24 NOTE — Telephone Encounter (Signed)
 Copied from CRM [id number]. Topic: Clinical - Medication Question >> Aug 23, 2024  5:09 PM Debby BROCKS wrote: Reason for CRM: Cecilia from [facility] called stating that they have sent scripts for sertraline  (ZOLOFT ) 100 MG tablet but no one has replied yet. They would like to get it ready for the patient. Callback:[phone]  [facility] #[id number] - [place], [place] - [address] AT [facility] [address]

## 2024-08-24 NOTE — Telephone Encounter (Signed)
 Refill sent.

## 2024-09-01 ENCOUNTER — Other Ambulatory Visit (HOSPITAL_COMMUNITY): Payer: Self-pay

## 2024-09-01 ENCOUNTER — Telehealth: Payer: Self-pay

## 2024-09-01 NOTE — Telephone Encounter (Signed)
 Pharmacy Patient Advocate Encounter   Received notification from CoverMyMeds that prior authorization for Ozempic  (0.25 or 0.5 MG/DOSE) 2MG /3ML pen-injectors  is due for renewal.   Insurance verification completed.   The patient is insured through [facility].  Action: PA required and submitted KEY/EOC/Request #: [id number] from 09/01/24 to 09/01/25   *RENEWAL*

## 2024-09-26 ENCOUNTER — Encounter: Payer: Self-pay | Admitting: Medical-Surgical

## 2024-09-26 ENCOUNTER — Ambulatory Visit (INDEPENDENT_AMBULATORY_CARE_PROVIDER_SITE_OTHER): Admitting: Medical-Surgical

## 2024-09-26 VITALS — BP 116/74 | HR 81 | Resp 20 | Ht 66.0 in | Wt 194.2 lb

## 2024-09-26 DIAGNOSIS — M170 Bilateral primary osteoarthritis of knee: Secondary | ICD-10-CM

## 2024-09-26 DIAGNOSIS — S4991XD Unspecified injury of right shoulder and upper arm, subsequent encounter: Secondary | ICD-10-CM

## 2024-09-26 DIAGNOSIS — I10 Essential (primary) hypertension: Secondary | ICD-10-CM | POA: Diagnosis not present

## 2024-09-26 DIAGNOSIS — F17210 Nicotine dependence, cigarettes, uncomplicated: Secondary | ICD-10-CM

## 2024-09-26 DIAGNOSIS — E119 Type 2 diabetes mellitus without complications: Secondary | ICD-10-CM | POA: Diagnosis not present

## 2024-09-26 DIAGNOSIS — F5101 Primary insomnia: Secondary | ICD-10-CM

## 2024-09-26 DIAGNOSIS — Z0000 Encounter for general adult medical examination without abnormal findings: Secondary | ICD-10-CM

## 2024-09-26 DIAGNOSIS — G43009 Migraine without aura, not intractable, without status migrainosus: Secondary | ICD-10-CM

## 2024-09-26 DIAGNOSIS — Z7984 Long term (current) use of oral hypoglycemic drugs: Secondary | ICD-10-CM

## 2024-09-26 DIAGNOSIS — J302 Other seasonal allergic rhinitis: Secondary | ICD-10-CM

## 2024-09-26 DIAGNOSIS — E785 Hyperlipidemia, unspecified: Secondary | ICD-10-CM

## 2024-09-26 LAB — POCT GLYCOSYLATED HEMOGLOBIN (HGB A1C)
HbA1c, POC (controlled diabetic range): 6.1 % (ref 0.0–7.0)
Hemoglobin A1C: 6.1 % — AB (ref 4.0–5.6)

## 2024-09-26 LAB — OPHTHALMOLOGY REPORT-SCANNED

## 2024-09-26 MED ORDER — OZEMPIC (1 MG/DOSE) 4 MG/3ML ~~LOC~~ SOPN: 1.0000 mg | PEN_INJECTOR | SUBCUTANEOUS | 3 refills | Status: AC

## 2024-09-26 MED ORDER — ALBUTEROL SULFATE HFA 108 (90 BASE) MCG/ACT IN AERS: 2.0000 | INHALATION_SPRAY | RESPIRATORY_TRACT | 1 refills | Status: AC | PRN

## 2024-09-26 MED ORDER — SUMATRIPTAN SUCCINATE 50 MG PO TABS: 50.0000 mg | ORAL_TABLET | ORAL | 11 refills | Status: AC | PRN

## 2024-09-26 MED ORDER — AMLODIPINE BESYLATE 5 MG PO TABS: ORAL_TABLET | ORAL | 3 refills | Status: AC

## 2024-09-26 MED ORDER — DOXEPIN HCL 25 MG PO CAPS: ORAL_CAPSULE | ORAL | 3 refills | Status: AC

## 2024-09-26 MED ORDER — ATORVASTATIN CALCIUM 40 MG PO TABS: 40.0000 mg | ORAL_TABLET | Freq: Every day | ORAL | 3 refills | Status: AC

## 2024-09-26 MED ORDER — CYCLOBENZAPRINE HCL 10 MG PO TABS: 10.0000 mg | ORAL_TABLET | Freq: Three times a day (TID) | ORAL | 5 refills | Status: AC | PRN

## 2024-09-26 MED ORDER — FLUTICASONE PROPIONATE 50 MCG/ACT NA SUSP: 1.0000 | Freq: Every day | NASAL | 11 refills | Status: AC

## 2024-09-26 MED ORDER — BUPROPION HCL ER (XL) 150 MG PO TB24: 150.0000 mg | ORAL_TABLET | Freq: Every day | ORAL | 1 refills | Status: AC

## 2024-09-26 MED ORDER — ZOLPIDEM TARTRATE 5 MG PO TABS: ORAL_TABLET | ORAL | 1 refills | Status: AC

## 2024-09-26 NOTE — Progress Notes (Signed)
 Complete physical exam  Patient: Hailey Robinson   DOB: 05/09/1961   63 y.o. Female  MRN: [id number]  Subjective:    Chief Complaint  Patient presents with   Annual Exam   Weight Management Screening   Knee Pain    Bone on Bone    Hailey Robinson is a 63 y.o. female who presents today for a complete physical exam. She reports consuming a general diet. The patient has a physically strenuous job, but has no regular exercise apart from work.  She generally feels well. She reports sleeping well. She does not have additional problems to discuss today.    Most recent fall risk assessment:    02/09/2023   10:05 AM  Fall Risk   Falls in the past year? 0  Number falls in past yr: 0  Injury with Fall? 0  Risk for fall due to : No Fall Risks  Follow up Falls evaluation completed     Most recent depression screenings:    09/26/2024    5:12 PM 12/23/2023   10:09 AM  PHQ 2/9 Scores  PHQ - 2 Score 0 0  PHQ- 9 Score 3 0    Vision:Within last year and Dental: No current dental problems and Receives regular dental care    Patient Care Team: Willo Mini, NP as PCP - General (Nurse Practitioner) Trixie File, MD as Consulting Physician (Endocrinology) Brien Garnette Pepper, MD as Consulting Physician (Otolaryngology) Domenica Reusing, MD as Consulting Physician (Neurology)   Outpatient Medications Prior to Visit  Medication Sig   aspirin  EC 81 MG tablet Take 1 tablet (81 mg total) by mouth daily.   chlorthalidone  (HYGROTON ) 25 MG tablet Take 1 tablet (25 mg total) by mouth daily. TAKE 1 TABLET(25 MG) BY MOUTH DAILY   fexofenadine (ALLEGRA) 180 MG tablet Take 180 mg by mouth daily.   meloxicam  (MOBIC ) 15 MG tablet TAKE 1 TABLET(15 MG) BY MOUTH DAILY   metFORMIN  (GLUCOPHAGE ) 500 MG tablet Take 1 tablet (500 mg total) by mouth daily with breakfast.   potassium chloride  (KLOR-CON ) 20 MEQ packet Take 20 mEq by mouth 2 (two) times daily.   sertraline  (ZOLOFT ) 100 MG tablet  Take 1 tablet (100 mg total) by mouth daily.   [DISCONTINUED] albuterol  (VENTOLIN  HFA) 108 (90 Base) MCG/ACT inhaler Inhale 2 puffs into the lungs every 4 (four) hours as needed for wheezing or shortness of breath.   [DISCONTINUED] amLODipine  (NORVASC ) 5 MG tablet TAKE 1 TABLET(5 MG) BY MOUTH DAILY   [DISCONTINUED] atorvastatin  (LIPITOR) 40 MG tablet Take 1 tablet (40 mg total) by mouth daily.   [DISCONTINUED] cyclobenzaprine  (FLEXERIL ) 10 MG tablet Take 1 tablet (10 mg total) by mouth 3 (three) times daily as needed for muscle spasms.   [DISCONTINUED] doxepin  (SINEQUAN ) 25 MG capsule TAKE 1 CAPSULE BY MOUTH AT BEDTIME AS NEEDED FOR SLEEP OR ANXIETY   [DISCONTINUED] fluticasone  (FLONASE ) 50 MCG/ACT nasal spray Place 1 spray into both nostrils daily.   [DISCONTINUED] Semaglutide , 1 MG/DOSE, (OZEMPIC , 1 MG/DOSE,) 4 MG/3ML SOPN Inject 1 mg into the skin once a week.   [DISCONTINUED] SUMAtriptan  (IMITREX ) 50 MG tablet Take 1 tablet (50 mg total) by mouth every 2 (two) hours as needed for migraine. May repeat in 2 hours if headache persists or recurs. Maximum daily dose: 2 tablets (100mg ).   [DISCONTINUED] zolpidem  (AMBIEN ) 5 MG tablet TAKE 1 TABLET(5 MG) BY MOUTH AT BEDTIME AS NEEDED FOR SLEEP   No facility-administered medications prior to visit.    Review of  Systems  Constitutional:  Negative for chills, fever, malaise/fatigue and weight loss.  HENT:  Negative for congestion, ear pain, hearing loss, sinus pain and sore throat.   Eyes:  Negative for blurred vision, photophobia and pain.  Respiratory:  Negative for cough, shortness of breath and wheezing.   Cardiovascular:  Negative for chest pain, palpitations and leg swelling.  Gastrointestinal:  Negative for abdominal pain, constipation, diarrhea, heartburn, nausea and vomiting.  Genitourinary:  Negative for dysuria, frequency and urgency.  Musculoskeletal:  Negative for falls and neck pain.  Skin:  Negative for itching and rash.   Neurological:  Negative for dizziness, weakness and headaches.  Endo/Heme/Allergies:  Negative for polydipsia. Does not bruise/bleed easily.  Psychiatric/Behavioral:  Negative for depression, substance abuse and suicidal ideas. The patient is not nervous/anxious and does not have insomnia.      Objective:     BP 116/74 (BP Location: Left Arm, Cuff Size: Normal)   Pulse 81   Resp 20   Ht 5' 6 (1.676 m)   Wt 194 lb 3.2 oz (88.1 kg)   LMP 01/24/2018 (Approximate)   SpO2 98%   BMI 31.34 kg/m    Physical Exam Constitutional:      General: She is not in acute distress.    Appearance: Normal appearance. She is not ill-appearing.  HENT:     Head: Normocephalic and atraumatic.     Right Ear: Tympanic membrane, ear canal and external ear normal. There is no impacted cerumen.     Left Ear: Tympanic membrane, ear canal and external ear normal. There is no impacted cerumen.     Nose: Nose normal. No congestion or rhinorrhea.     Mouth/Throat:     Mouth: Mucous membranes are moist.     Pharynx: No oropharyngeal exudate or posterior oropharyngeal erythema.  Eyes:     General: No scleral icterus.       Right eye: No discharge.        Left eye: No discharge.     Extraocular Movements: Extraocular movements intact.     Conjunctiva/sclera: Conjunctivae normal.     Pupils: Pupils are equal, round, and reactive to light.  Neck:     Thyroid : No thyromegaly.     Vascular: No carotid bruit or JVD.     Trachea: Trachea normal.  Cardiovascular:     Rate and Rhythm: Normal rate and regular rhythm.     Pulses: Normal pulses.     Heart sounds: Normal heart sounds. No murmur heard.    No friction rub. No gallop.  Pulmonary:     Effort: Pulmonary effort is normal. No respiratory distress.     Breath sounds: Normal breath sounds. No wheezing.  Abdominal:     General: Bowel sounds are normal. There is no distension.     Palpations: Abdomen is soft.     Tenderness: There is no abdominal  tenderness. There is no guarding.  Musculoskeletal:        General: Normal range of motion.     Cervical back: Normal range of motion and neck supple.  Lymphadenopathy:     Cervical: No cervical adenopathy.  Skin:    General: Skin is warm and dry.  Neurological:     Mental Status: She is alert and oriented to person, place, and time.     Cranial Nerves: No cranial nerve deficit.  Psychiatric:        Mood and Affect: Mood normal.        Behavior: Behavior  normal.        Thought Content: Thought content normal.        Judgment: Judgment normal.      Results for orders placed or performed in visit on 09/26/24  POCT HgB A1C  Result Value Ref Range   Hemoglobin A1C 6.1 (A) 4.0 - 5.6 %   HbA1c POC (<> result, manual entry)     HbA1c, POC (prediabetic range)     HbA1c, POC (controlled diabetic range) 6.1 0.0 - 7.0 %       Assessment & Plan:    Routine Health Maintenance and Physical Exam  Immunization History  Administered Date(s) Administered   Influenza-Unspecified 09/07/2019, 08/07/2022, 10/23/2023, 08/26/2024   PFIZER(Purple Top)SARS-COV-2 Vaccination 01/15/2020, 02/09/2020    Health Maintenance  Topic Date Due   DTaP/Tdap/Td (1 - Tdap) Never done   Pneumococcal Vaccine: 50+ Years (1 of 2 - PCV) Never done   Zoster Vaccines- Shingrix (1 of 2) Never done   OPHTHALMOLOGY EXAM  09/19/2024   COVID-19 Vaccine (3 - Pfizer risk series) 10/12/2024 (Originally 03/08/2020)   Diabetic kidney evaluation - Urine ACR  11/24/2024   FOOT EXAM  11/24/2024   HEMOGLOBIN A1C  03/26/2025   Diabetic kidney evaluation - eGFR measurement  04/05/2025   Mammogram  05/10/2026   Cervical Cancer Screening (HPV/Pap Cotest)  09/16/2028   Colonoscopy  04/18/2029   Influenza Vaccine  Completed   Hepatitis C Screening  Completed   HIV Screening  Completed   Hepatitis B Vaccines 19-59 Average Risk  Aged Out   HPV VACCINES  Aged Out   Meningococcal B Vaccine  Aged Out    Discussed health  benefits of physical activity, and encouraged her to engage in regular exercise appropriate for her age and condition.  1. Annual physical exam (Primary) Checking labs as below. UTD on preventative care. Wellness information provided with AVS. - CBC - CMP14+EGFR - Lipid panel  2. Controlled type 2 diabetes mellitus without complication, without long-term current use of insulin (HCC) A1c at 6.1% today indicating excellent control. Never got the 1mg  dose of Ozempic  from the pharmacy and has just been doing the 0.5mg  dose along with Metformin  500mg  daily with breakfast. To aid with weight management and A1c control, increase Ozempic  to 1mg  weekly. Continue Metformin  as prescribed.  - Semaglutide , 1 MG/DOSE, (OZEMPIC , 1 MG/DOSE,) 4 MG/3ML SOPN; Inject 1 mg into the skin once a week.  Dispense: 9 mL; Refill: 3 - POCT HgB A1C  3. Cigarette nicotine  dependence without complication Smoking about 1/2 ppd, not ready to quit. Advised on recommendations for cessation and to reach out for help when she is ready.   4. Dyslipidemia, goal LDL below 70 Updating labs today. Continue Lipitor as prescribed.  - CMP14+EGFR - Lipid panel - atorvastatin  (LIPITOR) 40 MG tablet; Take 1 tablet (40 mg total) by mouth daily.  Dispense: 90 tablet; Refill: 3  5. Essential hypertension BP well controlled and at goal. Updating labs. Continue Amlodipine  5mg  and chlorthalidone  25mg  daily.  - CBC - CMP14+EGFR - Lipid panel - amLODipine  (NORVASC ) 5 MG tablet; TAKE 1 TABLET(5 MG) BY MOUTH DAILY  Dispense: 90 tablet; Refill: 3  6. Primary insomnia Taking Ambien  5mg  and Doxepin  25mg  nightly with good benefit, no SE. Continue medications as prescribed.   7. Seasonal allergic rhinitis, unspecified trigger Continue Flonase  and prn albuterol .  - albuterol  (VENTOLIN  HFA) 108 (90 Base) MCG/ACT inhaler; Inhale 2 puffs into the lungs every 4 (four) hours as needed for wheezing  or shortness of breath.  Dispense: 6.7 g; Refill:  1 - fluticasone  (FLONASE ) 50 MCG/ACT nasal spray; Place 1 spray into both nostrils daily.  Dispense: 16 g; Refill: 11  8. Injury of right shoulder, subsequent encounter Has been using Flexeril  TID prn with good relief of symptoms. Finds this helpful for knee pain too. Continue Flexeril  as prescribed.  - cyclobenzaprine  (FLEXERIL ) 10 MG tablet; Take 1 tablet (10 mg total) by mouth 3 (three) times daily as needed for muscle spasms.  Dispense: 90 tablet; Refill: 5  9. Migraine without aura and without status migrainosus, not intractable Well controlled with infrequent migraine episodes. Continue Imitrex  prn.  - SUMAtriptan  (IMITREX ) 50 MG tablet; Take 1 tablet (50 mg total) by mouth every 2 (two) hours as needed for migraine. May repeat in 2 hours if headache persists or recurs. Maximum daily dose: 2 tablets (100mg ).  Dispense: 10 tablet; Refill: 11  11. Primary osteoarthritis of both knees Reports that she has severe osteoarthritis and was told that her knees are bone on bone. Referring to orthopedics for further evaluation of treatment options.  - AMB referral to orthopedics   Return in about 6 months (around 03/26/2025) for chronic disease follow up.     Grier Czerwinski, NP

## 2024-09-26 NOTE — Patient Instructions (Signed)
 Preventive Care 58-62 Years Old, Female  Preventive care refers to lifestyle choices and visits with your health care provider that can promote health and wellness. Preventive care visits are also called wellness exams.  What can I expect for my preventive care visit?  Counseling  Your health care provider may ask you questions about your:  Medical history, including:  Past medical problems.  Family medical history.  Pregnancy history.  Current health, including:  Menstrual cycle.  Method of birth control.  Emotional well-being.  Home life and relationship well-being.  Sexual activity and sexual health.  Lifestyle, including:  Alcohol, nicotine or tobacco, and drug use.  Access to firearms.  Diet, exercise, and sleep habits.  Work and work Astronomer.  Sunscreen use.  Safety issues such as seatbelt and bike helmet use.  Physical exam  Your health care provider will check your:  Height and weight. These may be used to calculate your BMI (body mass index). BMI is a measurement that tells if you are at a healthy weight.  Waist circumference. This measures the distance around your waistline. This measurement also tells if you are at a healthy weight and may help predict your risk of certain diseases, such as type 2 diabetes and high blood pressure.  Heart rate and blood pressure.  Body temperature.  Skin for abnormal spots.  What immunizations do I need?    Vaccines are usually given at various ages, according to a schedule. Your health care provider will recommend vaccines for you based on your age, medical history, and lifestyle or other factors, such as travel or where you work.  What tests do I need?  Screening  Your health care provider may recommend screening tests for certain conditions. This may include:  Lipid and cholesterol levels.  Diabetes screening. This is done by checking your blood sugar (glucose) after you have not eaten for a while (fasting).  Pelvic exam and Pap test.  Hepatitis B test.  Hepatitis C  test.  HIV (human immunodeficiency virus) test.  STI (sexually transmitted infection) testing, if you are at risk.  Lung cancer screening.  Colorectal cancer screening.  Mammogram. Talk with your health care provider about when you should start having regular mammograms. This may depend on whether you have a family history of breast cancer.  BRCA-related cancer screening. This may be done if you have a family history of breast, ovarian, tubal, or peritoneal cancers.  Bone density scan. This is done to screen for osteoporosis.  Talk with your health care provider about your test results, treatment options, and if necessary, the need for more tests.  Follow these instructions at home:  Eating and drinking    Eat a diet that includes fresh fruits and vegetables, whole grains, lean protein, and low-fat dairy products.  Take vitamin and mineral supplements as recommended by your health care provider.  Do not drink alcohol if:  Your health care provider tells you not to drink.  You are pregnant, may be pregnant, or are planning to become pregnant.  If you drink alcohol:  Limit how much you have to 0-1 drink a day.  Know how much alcohol is in your drink. In the [place] one drink equals one 12 oz bottle of beer (355 mL), one 5 oz glass of wine (148 mL), or one 1 oz glass of hard liquor (44 mL).  Lifestyle  Brush your teeth every morning and night with fluoride toothpaste. Floss one time each day.  Exercise for at least  30 minutes 5 or more days each week.  Do not use any products that contain nicotine or tobacco. These products include cigarettes, chewing tobacco, and vaping devices, such as e-cigarettes. If you need help quitting, ask your health care provider.  Do not use drugs.  If you are sexually active, practice safe sex. Use a condom or other form of protection to prevent STIs.  If you do not wish to become pregnant, use a form of birth control. If you plan to become pregnant, see your health care provider for a  prepregnancy visit.  Take aspirin only as told by your health care provider. Make sure that you understand how much to take and what form to take. Work with your health care provider to find out whether it is safe and beneficial for you to take aspirin daily.  Find healthy ways to manage stress, such as:  Meditation, yoga, or listening to music.  Journaling.  Talking to a trusted person.  Spending time with friends and family.  Minimize exposure to UV radiation to reduce your risk of skin cancer.  Safety  Always wear your seat belt while driving or riding in a vehicle.  Do not drive:  If you have been drinking alcohol. Do not ride with someone who has been drinking.  When you are tired or distracted.  While texting.  If you have been using any mind-altering substances or drugs.  Wear a helmet and other protective equipment during sports activities.  If you have firearms in your house, make sure you follow all gun safety procedures.  Seek help if you have been physically or sexually abused.  What's next?  Visit your health care provider once a year for an annual wellness visit.  Ask your health care provider how often you should have your eyes and teeth checked.  Stay up to date on all vaccines.  This information is not intended to replace advice given to you by your health care provider. Make sure you discuss any questions you have with your health care provider.  Document Revised: 05/07/2021 Document Reviewed: 05/07/2021  Elsevier Patient Education  2024 [employer].

## 2024-09-29 ENCOUNTER — Encounter: Admitting: Medical-Surgical

## 2024-10-06 ENCOUNTER — Ambulatory Visit: Admitting: Medical-Surgical

## 2024-11-08 ENCOUNTER — Telehealth

## 2024-11-08 DIAGNOSIS — R112 Nausea with vomiting, unspecified: Secondary | ICD-10-CM | POA: Diagnosis not present

## 2024-11-08 MED ORDER — ONDANSETRON 4 MG PO TBDP: 4.0000 mg | ORAL_TABLET | Freq: Three times a day (TID) | ORAL | 0 refills | Status: AC | PRN

## 2024-11-08 NOTE — Progress Notes (Signed)
 Virtual Visit Consent   Hailey Robinson, you are scheduled for a virtual visit with a [facility] provider today. Just as with appointments in the office, your consent must be obtained to participate. Your consent will be active for this visit and any virtual visit you may have with one of our providers in the next 365 days. If you have a MyChart account, a copy of this consent can be sent to you electronically.  As this is a virtual visit, video technology does not allow for your provider to perform a traditional examination. This may limit your provider's ability to fully assess your condition. If your provider identifies any concerns that need to be evaluated in person or the need to arrange testing (such as labs, EKG, etc.), we will make arrangements to do so. Although advances in technology are sophisticated, we cannot ensure that it will always work on either your end or our end. If the connection with a video visit is poor, the visit may have to be switched to a telephone visit. With either a video or telephone visit, we are not always able to ensure that we have a secure connection.  By engaging in this virtual visit, you consent to the provision of healthcare and authorize for your insurance to be billed (if applicable) for the services provided during this visit. Depending on your insurance coverage, you may receive a charge related to this service.  I need to obtain your verbal consent now. Are you willing to proceed with your visit today? Hailey Robinson has provided verbal consent on 11/08/2024 for a virtual visit (video or telephone). Chiquita CHRISTELLA Barefoot, NP  Date: 11/08/2024 11:33 AM   Virtual Visit via Video Note   I, Chiquita CHRISTELLA Barefoot, connected with  Hailey Robinson  ([id number], [birth year]) on 11/08/2024 at 11:30 AM EST by a video-enabled telemedicine application and verified that I am speaking with the correct person using two identifiers.  Location: Patient: Virtual Visit  Location Patient: Home Provider: Virtual Visit Location Provider: Home Office   I discussed the limitations of evaluation and management by telemedicine and the availability of in person appointments. The patient expressed understanding and agreed to proceed.    History of Present Illness: Hailey Robinson is a 63 y.o. 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   Sleep disturbance 04/05/2024   Anxiety 09/17/2023   Controlled type 2 diabetes mellitus without complication, without long-term current use of insulin (HCC) 09/17/2023   Insomnia 02/09/2023   Migraine without aura and without status migrainosus, not intractable 03/30/2022   Strain of muscle, fascia and tendon of lower back, initial encounter 03/11/2021   Left breast mass 04/08/2020   Seasonal allergic rhinitis 07/06/2019   Superficial perivascular dermatitis 04/21/2019   Hypokalemia due to excessive renal loss of potassium 03/29/2019   Degenerative arthritis of cervical spine 03/29/2019   Chronic bronchitis (HCC) 03/29/2019   Cigarette nicotine  dependence without complication 05/26/2018   At risk for cardiovascular event 05/10/2018   Dyslipidemia,  goal LDL below 70 05/06/2018   H/O Graves' disease 05/05/2018   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  Resting comfortably at home.  Head is normocephalic, atraumatic.  No labored breathing.  Speech is clear and coherent with logical content.  Patient is alert and oriented at baseline.    Assessment and Plan:  1. Nausea and vomiting, unspecified vomiting type (Primary)  - ondansetron  (ZOFRAN -ODT) 4 MG disintegrating tablet; Take 1 tablet (4 mg total) by mouth every 8 (eight) hours as needed for nausea or vomiting.  Dispense: 20 tablet; Refill: 0  -hydration once zofran  is in -discuss with PCP  about adjustment of medication to reduce symptoms -in person strict instructions if unable to get uncontrol with zofran  or no improvement -more info on AVS for treatment and prevention    Reviewed side effects, risks and benefits of medication.    Patient acknowledged agreement and understanding of the plan.   Past Medical, Surgical, Social History, Allergies, and Medications have been Reviewed.    Follow Up Instructions: I discussed the assessment and treatment plan with the patient. The patient was provided an opportunity to ask questions and all were answered. The patient agreed with the plan and demonstrated an understanding of the instructions.  A copy of instructions were sent to the patient via MyChart unless otherwise noted below.    The patient was advised to call back or seek an in-person evaluation if the symptoms worsen or if the condition fails to improve as anticipated.    Chiquita CHRISTELLA Barefoot, NP     [1]  Allergies Allergen Reactions   Codeine Hives and Itching  [2]  Current Outpatient Medications:    albuterol  (VENTOLIN  HFA) 108 (90 Base) MCG/ACT inhaler, Inhale 2 puffs into the lungs every 4 (four) hours as needed for wheezing or shortness of breath., Disp: 6.7 g, Rfl: 1   amLODipine  (NORVASC ) 5 MG tablet, TAKE 1 TABLET(5 MG) BY MOUTH DAILY, Disp: 90 tablet, Rfl: 3   aspirin  EC 81 MG tablet, Take 1 tablet (81 mg  total) by mouth daily., Disp: 90 tablet, Rfl: 3   atorvastatin  (LIPITOR) 40 MG tablet, Take 1 tablet (40 mg total) by mouth daily., Disp: 90 tablet, Rfl: 3   buPROPion  (WELLBUTRIN  XL) 150 MG 24 hr tablet, Take 1 tablet (150 mg total) by mouth daily., Disp: 90 tablet, Rfl: 1   chlorthalidone  (HYGROTON ) 25 MG tablet, Take 1 tablet (25 mg total) by mouth daily. TAKE 1 TABLET(25 MG) BY MOUTH DAILY, Disp: 90 tablet, Rfl: 3   cyclobenzaprine  (FLEXERIL ) 10 MG tablet, Take 1 tablet (10 mg total) by mouth 3 (three) times daily as needed for muscle spasms., Disp: 90 tablet, Rfl: 5   doxepin  (SINEQUAN ) 25 MG capsule, TAKE 1 CAPSULE BY MOUTH AT BEDTIME AS NEEDED FOR SLEEP OR ANXIETY, Disp: 90 capsule, Rfl: 3   fexofenadine (ALLEGRA) 180 MG tablet, Take 180 mg by mouth daily., Disp: , Rfl:    fluticasone  (FLONASE ) 50 MCG/ACT nasal spray, Place 1 spray into both nostrils daily., Disp: 16 g, Rfl: 11   meloxicam  (MOBIC ) 15 MG tablet, TAKE 1 TABLET(15 MG) BY MOUTH DAILY, Disp: 90 tablet, Rfl: 3   metFORMIN  (GLUCOPHAGE ) 500 MG tablet, Take 1 tablet (500 mg total) by mouth daily with breakfast., Disp: 90 tablet, Rfl: 3   potassium chloride  (KLOR-CON ) 20 MEQ  packet, Take 20 mEq by mouth 2 (two) times daily., Disp: 180 packet, Rfl: 1   Semaglutide , 1 MG/DOSE, (OZEMPIC , 1 MG/DOSE,) 4 MG/3ML SOPN, Inject 1 mg into the skin once a week., Disp: 9 mL, Rfl: 3   sertraline  (ZOLOFT ) 100 MG tablet, Take 1 tablet (100 mg total) by mouth daily., Disp: 90 tablet, Rfl: 1   SUMAtriptan  (IMITREX ) 50 MG tablet, Take 1 tablet (50 mg total) by mouth every 2 (two) hours as needed for migraine. May repeat in 2 hours if headache persists or recurs. Maximum daily dose: 2 tablets (100mg )., Disp: 10 tablet, Rfl: 11   zolpidem  (AMBIEN ) 5 MG tablet, TAKE 1 TABLET(5 MG) BY MOUTH AT BEDTIME AS NEEDED FOR SLEEP, Disp: 90 tablet, Rfl: 1

## 2024-11-08 NOTE — Patient Instructions (Addendum)
 Hailey Robinson, thank you for joining Hailey CHRISTELLA Barefoot, NP for today's virtual visit.  While this provider is not your primary care provider (PCP), if your PCP is located in our provider database this encounter information will be shared with them immediately following your visit.   A [facility] MyChart account gives you access to today's visit and all your visits, tests, and labs performed at [facility]  click here if you don't have a [facility] MyChart account or go to [email or web address]  Consent: (Patient) Hailey Robinson provided verbal consent for this virtual visit at the beginning of the encounter.  Current Medications:  Current Outpatient Medications:    ondansetron  (ZOFRAN -ODT) 4 MG disintegrating tablet, Take 1 tablet (4 mg total) by mouth every 8 (eight) hours as needed for nausea or vomiting., Disp: 20 tablet, Rfl: 0   albuterol  (VENTOLIN  HFA) 108 (90 Base) MCG/ACT inhaler, Inhale 2 puffs into the lungs every 4 (four) hours as needed for wheezing or shortness of breath., Disp: 6.7 g, Rfl: 1   amLODipine  (NORVASC ) 5 MG tablet, TAKE 1 TABLET(5 MG) BY MOUTH DAILY, Disp: 90 tablet, Rfl: 3   aspirin  EC 81 MG tablet, Take 1 tablet (81 mg total) by mouth daily., Disp: 90 tablet, Rfl: 3   atorvastatin  (LIPITOR) 40 MG tablet, Take 1 tablet (40 mg total) by mouth daily., Disp: 90 tablet, Rfl: 3   buPROPion  (WELLBUTRIN  XL) 150 MG 24 hr tablet, Take 1 tablet (150 mg total) by mouth daily., Disp: 90 tablet, Rfl: 1   chlorthalidone  (HYGROTON ) 25 MG tablet, Take 1 tablet (25 mg total) by mouth daily. TAKE 1 TABLET(25 MG) BY MOUTH DAILY, Disp: 90 tablet, Rfl: 3   cyclobenzaprine  (FLEXERIL ) 10 MG tablet, Take 1 tablet (10 mg total) by mouth 3 (three) times daily as needed for muscle spasms., Disp: 90 tablet, Rfl: 5   doxepin  (SINEQUAN ) 25 MG capsule, TAKE 1 CAPSULE BY MOUTH AT BEDTIME AS NEEDED FOR SLEEP OR ANXIETY, Disp: 90 capsule, Rfl: 3   fexofenadine (ALLEGRA) 180  MG tablet, Take 180 mg by mouth daily., Disp: , Rfl:    fluticasone  (FLONASE ) 50 MCG/ACT nasal spray, Place 1 spray into both nostrils daily., Disp: 16 g, Rfl: 11   meloxicam  (MOBIC ) 15 MG tablet, TAKE 1 TABLET(15 MG) BY MOUTH DAILY, Disp: 90 tablet, Rfl: 3   metFORMIN  (GLUCOPHAGE ) 500 MG tablet, Take 1 tablet (500 mg total) by mouth daily with breakfast., Disp: 90 tablet, Rfl: 3   potassium chloride  (KLOR-CON ) 20 MEQ packet, Take 20 mEq by mouth 2 (two) times daily., Disp: 180 packet, Rfl: 1   Semaglutide , 1 MG/DOSE, (OZEMPIC , 1 MG/DOSE,) 4 MG/3ML SOPN, Inject 1 mg into the skin once a week., Disp: 9 mL, Rfl: 3   sertraline  (ZOLOFT ) 100 MG tablet, Take 1 tablet (100 mg total) by mouth daily., Disp: 90 tablet, Rfl: 1   SUMAtriptan  (IMITREX ) 50 MG tablet, Take 1 tablet (50 mg total) by mouth every 2 (two) hours as needed for migraine. May repeat in 2 hours if headache persists or recurs. Maximum daily dose: 2 tablets (100mg )., Disp: 10 tablet, Rfl: 11   zolpidem  (AMBIEN ) 5 MG tablet, TAKE 1 TABLET(5 MG) BY MOUTH AT BEDTIME AS NEEDED FOR SLEEP, Disp: 90 tablet, Rfl: 1   Medications ordered in this encounter:  Meds ordered this encounter  Medications   ondansetron  (ZOFRAN -ODT) 4 MG disintegrating tablet    Sig: Take 1 tablet (4 mg total) by mouth every 8 (eight) hours as needed  for nausea or vomiting.    Dispense:  20 tablet    Refill:  0    Supervising Provider:   LAMPTEY, PHILIP O [[id number]]     *If you need refills on other medications prior to your next appointment, please contact your pharmacy*  Follow-Up: Call back or seek an in-person evaluation if the symptoms worsen or if the condition fails to improve as anticipated.  [facility] [phone]  Other Instructions Nausea and Vomiting, Adult Nausea is feeling that you have an upset stomach and that you are about to vomit. Vomiting is when food in your stomach forcefully comes out of your mouth. Vomiting can make you  feel weak. If you vomit, or if you are not able to drink enough fluids, you may not have enough water in your body (get dehydrated). If you do not have enough water in your body, you may: Feel tired. Feel thirsty. Have a dry mouth. Have cracked lips. Pee (urinate) less often. Older adults and people with other diseases or a weak body defense system (immune system) are at higher risk for not having enough water in the body. If you feel like you may vomit or you vomit, it is important to follow instructions from your doctor about how to take care of yourself. Follow these instructions at home: Watch your symptoms for any changes. Tell your doctor about them. Eating and drinking     Take an ORS (oral rehydration solution). This is a drink that is sold at pharmacies and stores. Drink clear fluids in small amounts as you are able, such as: Water. Ice chips. Fruit juice that has water added (diluted fruit juice). Low-calorie sports drinks. Eat bland, easy-to-digest foods in small amounts as you are able, such as: Bananas. Applesauce. Rice. Low-fat (lean) meats. Toast. Crackers. Avoid drinking fluids that have a lot of sugar or caffeine in them. This includes energy drinks, sports drinks, and soda. Avoid alcohol. Avoid spicy or fatty foods. General instructions Take over-the-counter and prescription medicines only as told by your doctor. Drink enough fluid to keep your pee (urine) pale yellow. Wash your hands often with soap and water for at least 20 seconds. If you cannot use soap and water, use hand sanitizer. Make sure that everyone in your home washes their hands well and often. Rest at home until you feel better. Watch your condition for any changes. Take slow and deep breaths when you feel like you may vomit. Keep all follow-up visits. Contact a doctor if: Your symptoms get worse. You have new symptoms. You have a fever. You cannot drink fluids without vomiting. You feel  like you may vomit for more than 2 days. You feel light-headed or dizzy. You have a headache. You have muscle cramps. You have a rash. You have pain while peeing. Get help right away if: You have pain in your chest, neck, arm, or jaw. You feel very weak or you faint. You vomit again and again. You have vomit that is bright red or looks like black coffee grounds. You have bloody or black poop (stools) or poop that looks like tar. You have a very bad headache, a stiff neck, or both. You have very bad pain, cramping, or bloating in your belly (abdomen). You have trouble breathing. You are breathing very quickly. Your heart is beating very quickly. Your skin feels cold and clammy. You feel confused. You have signs of losing too much water in your body, such as: Dark pee, very  little pee, or no pee. Cracked lips. Dry mouth. Sunken eyes. Sleepiness. Weakness. These symptoms may be an emergency. Get help right away. Call 911. Do not wait to see if the symptoms will go away. Do not drive yourself to the hospital. Summary Nausea is feeling that you have an upset stomach and that you are about to vomit. Vomiting is when food in your stomach comes out of your mouth. Follow instructions from your doctor about eating and drinking. Take over-the-counter and prescription medicines only as told by your doctor. Contact your doctor if your symptoms get worse or you have new symptoms. Keep all follow-up visits. This information is not intended to replace advice given to you by your health care provider. Make sure you discuss any questions you have with your health care provider. Document Revised: 05/16/2021 Document Reviewed: 05/16/2021 Elsevier Patient Education  2024 [employer]   If you have been instructed to have an in-person evaluation today at a local Urgent Care facility, please use the link below. It will take you to a list of all of our available [facility], including  address, phone number and hours of operation. Please do not delay care.  [facility]  If you or a family member do not have a primary care provider, use the link below to schedule a visit and establish care. When you choose a [facility] primary care physician or advanced practice provider, you gain a long-term partner in health. Find a Primary Care Provider  Learn more about [facility]'s in-office and virtual care options: [facility] - Get Care Now

## 2024-11-20 ENCOUNTER — Other Ambulatory Visit: Payer: Self-pay | Admitting: Medical-Surgical

## 2024-12-04 ENCOUNTER — Telehealth: Payer: Self-pay | Admitting: Pharmacy Technician

## 2024-12-04 ENCOUNTER — Other Ambulatory Visit (HOSPITAL_COMMUNITY): Payer: Self-pay

## 2024-12-04 NOTE — Telephone Encounter (Signed)
 Pharmacy Patient Advocate Encounter  Received notification from [facility] that Prior Authorization for  Zolpidem  Tartrate 5MG  tablets has been APPROVED from 12/04/2024 to 12/04/2025. Ran test claim, Copay is $4.73. This test claim was processed through [facility]- copay amounts may vary at other pharmacies due to pharmacy/plan contracts, or as the patient moves through the different stages of their insurance plan.   PA #/Case ID/Reference #: [id number]

## 2024-12-04 NOTE — Telephone Encounter (Signed)
 Pharmacy Patient Advocate Encounter   Received notification from [facility] that prior authorization for Zolpidem  Tartrate 5MG  tablets is required/requested.   Insurance verification completed.   The patient is insured through [employer].   Per test claim: PA required; PA submitted to above mentioned insurance via Latent Key/confirmation #/EOC [id number] Status is pending

## 2024-12-12 ENCOUNTER — Other Ambulatory Visit: Payer: Self-pay | Admitting: Medical-Surgical

## 2025-03-26 ENCOUNTER — Ambulatory Visit: Admitting: Medical-Surgical
